# Patient Record
Sex: Male | Born: 1955 | Race: White | Hispanic: No
Health system: Midwestern US, Community
[De-identification: ages and names within clinical notes are randomized; demographics above are authoritative.]

## PROBLEM LIST (undated history)

## (undated) DIAGNOSIS — F209 Schizophrenia, unspecified: Secondary | ICD-10-CM

## (undated) DIAGNOSIS — C801 Malignant (primary) neoplasm, unspecified: Secondary | ICD-10-CM

## (undated) DIAGNOSIS — F1010 Alcohol abuse, uncomplicated: Secondary | ICD-10-CM

## (undated) DIAGNOSIS — B1920 Unspecified viral hepatitis C without hepatic coma: Secondary | ICD-10-CM

## (undated) DIAGNOSIS — R569 Unspecified convulsions: Secondary | ICD-10-CM

## (undated) DIAGNOSIS — E7220 Disorder of urea cycle metabolism, unspecified: Secondary | ICD-10-CM

## (undated) DIAGNOSIS — Z8505 Personal history of malignant neoplasm of liver: Secondary | ICD-10-CM

## (undated) DIAGNOSIS — R410 Disorientation, unspecified: Principal | ICD-10-CM

## (undated) HISTORY — DX: Disorder of urea cycle metabolism, unspecified: E72.20

## (undated) HISTORY — DX: Personal history of malignant neoplasm of liver: Z85.05

## (undated) MED ORDER — QUETIAPINE SR 300 MG 24 HR TAB
300 mg | ORAL_TABLET | Freq: Every evening | ORAL | Status: DC
Start: ? — End: 2013-06-29

## (undated) MED ORDER — ASPIRIN 81 MG CHEWABLE TAB
81 mg | ORAL_TABLET | Freq: Every day | ORAL | Status: AC
Start: ? — End: 2013-07-29

## (undated) MED ORDER — FLUOXETINE 20 MG CAP
20 mg | ORAL_CAPSULE | Freq: Every day | ORAL | Status: AC
Start: ? — End: 2013-07-29

## (undated) MED ORDER — FLUOXETINE 20 MG CAP
20 mg | ORAL_CAPSULE | Freq: Every day | ORAL | Status: DC
Start: ? — End: 2013-06-29

## (undated) MED ORDER — MULTIVITAMIN-IRON 9 MG-FOLIC ACID 400 MCG-CALCIUM & MINERALS TABLET
9 mg iron-400 mcg | ORAL_TABLET | Freq: Every day | ORAL | Status: AC
Start: ? — End: 2013-07-29

## (undated) MED ORDER — QUETIAPINE 100 MG TAB
100 mg | ORAL_TABLET | Freq: Every evening | ORAL | Status: AC
Start: ? — End: 2013-07-29

## (undated) MED ORDER — THIAMINE HCL 100 MG TAB
100 mg | ORAL_TABLET | Freq: Every day | ORAL | Status: AC
Start: ? — End: 2013-07-29

## (undated) MED ORDER — PANTOPRAZOLE 40 MG TAB, DELAYED RELEASE
40 mg | ORAL_TABLET | Freq: Every day | ORAL | Status: AC
Start: ? — End: 2013-07-29

## (undated) MED ORDER — FOLIC ACID 1 MG TAB
1 mg | ORAL_TABLET | Freq: Every day | ORAL | Status: AC
Start: ? — End: 2013-07-29

---

## 2009-12-04 ENCOUNTER — Inpatient Hospital Stay
Admit: 2009-12-04 | Discharge: 2009-12-10 | Disposition: A | Discharge status: Home / Self Care | Payer: MEDICAID | Attending: Psychiatry | Admitting: Psychiatry

## 2009-12-04 DIAGNOSIS — F201 Disorganized schizophrenia: Secondary | ICD-10-CM

## 2009-12-04 MED ADMIN — lorazepam (ATIVAN) injection 2 mg: INTRAMUSCULAR | NDC 00409677802

## 2009-12-04 MED ADMIN — ziprasidone (GEODON) 20 mg in sterile water (preservative free) injection: INTRAMUSCULAR | NDC 00409488710

## 2009-12-04 MED FILL — LORAZEPAM 2 MG/ML IJ SOLN: 2 mg/mL | INTRAMUSCULAR | Qty: 1

## 2009-12-04 MED FILL — GEODON 20 MG/ML (FINAL CONCENTRATION) INTRAMUSCULAR SOLUTION: 20 mg/mL (final conc.) | INTRAMUSCULAR | Qty: 20

## 2009-12-04 NOTE — ED Notes (Signed)
Pt given dinner tray and bag lunch to eat. Pt cooperative throughout medication administration and specimen collection.

## 2009-12-04 NOTE — ED Notes (Signed)
Pt reports seeing Dr. Fredda Hammed "many years ago". Pt stated "I have been outside for a month, because I lost my house. I mean I can't find it."

## 2009-12-04 NOTE — ED Notes (Signed)
[facility] counselor here to evaluate patient for TDO.

## 2009-12-04 NOTE — ED Notes (Signed)
Pt picked up by [employer] on street, mumbling and rocking, stated "People are after me, trying to kill me, I can see them standing right there." Pt indicated corner of room where no one is standing. Pt threw trash can out of the room, stated "I had to get them before they get me." Pt currently refusing to get into gowns, stated "I know I will need to do that, but I am not going to do that right now." Pt is chewing on corner of coat, sitting on edge of bed, rocking back and forth. Pt is disheveled, smells of cigarette smoke. Pt stated "I'm not going to lie to you, I don't know where I am, what year it is, or why I am in here." [facility] counselor present in ED.

## 2009-12-04 NOTE — ED Notes (Signed)
Stable repeat BP98/67, RR 89, saturating 98% RA, Temp 98, not orthostatic, ambulatory, No dizziness

## 2009-12-04 NOTE — ED Notes (Signed)
Pt is poor historian, unable to provide personal history.

## 2009-12-04 NOTE — ED Notes (Signed)
Awaiting bed assignment from NS.

## 2009-12-04 NOTE — ED Notes (Signed)
[facility] here with TDO.
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
## 2009-12-04 NOTE — Behavioral Health Treatment Team (Cosign Needed)
Pt did not attend creative expression group.

## 2009-12-04 NOTE — ED Provider Notes (Signed)
I have personally seen and evaluated patient. I find the patient's history and physical exam are consistent with the PA's NP documentation. I agree with the care provided, treatments rendered, disposition and follow up plan.

## 2009-12-04 NOTE — ED Provider Notes (Signed)
HPI Comments: Patient is an extremely poor historian. Evidently patient was picked up by EMS for unclear reasons. He has a hx of schizophrenia and not on his meds--does not know which meds he is on. He explains that he hears voices and sees people who are not there--all of which are telling him that they want to kill him. He does not know why he is here and does not know where he came from. He says he is suicidal. Denies thoughts or intentions of hurting others.     Patient is a 54 y.o. male presenting with mental health disorder. The history is provided by the patient. The history is limited by the condition of the patient. No language interpreter was used.   Mental Health Problem  The primary symptoms include hallucinations and bizarre behavior.   He admits to suicidal ideas. He does not contemplate injuring another person. Risk factors that are present for mental illness include a history of mental illness.        No past medical history on file.     No past surgical history on file.      No family history on file.     History   Social History   ??? Marital Status: Unknown     Spouse Name: N/A     Number of Children: N/A   ??? Years of Education: N/A   Occupational History   ??? Not on file.   Social History Main Topics   ??? Smoking status: Not on file   ??? Smokeless tobacco: Not on file   ??? Alcohol Use: Not on file   ??? Drug Use: Not on file   ??? Sexually Active: Not on file   Other Topics Concern   ??? Not on file   Social History Narrative   ??? No narrative on file           ALLERGIES: Review of patient's allergies indicates not on file.      Review of Systems   Unable to perform ROS  Psychiatric/Behavioral: Positive for suicidal ideas, hallucinations and behavioral problems. The patient is nervous/anxious.        There were no vitals filed for this visit.         Physical Exam   Nursing note and vitals reviewed.  Constitutional: He is oriented to person, place, and time. He appears well-developed and well-nourished.    HENT:   Head: Normocephalic and atraumatic.   Right Ear: External ear normal.   Left Ear: External ear normal.   Mouth/Throat: Oropharynx is clear and moist. No oropharyngeal exudate.   Eyes: Conjunctivae and extraocular motions are normal. Pupils are equal, round, and reactive to light. Right eye exhibits no discharge. Left eye exhibits no discharge. No scleral icterus.   Neck: Normal range of motion. Neck supple. No tracheal deviation present. No thyromegaly present.   Cardiovascular: Normal rate, regular rhythm, normal heart sounds and intact distal pulses.    No murmur heard.  Pulmonary/Chest: Effort normal and breath sounds normal. No respiratory distress. He has no wheezes. He has no rales.   Abdominal: Soft. He exhibits no distension. No tenderness. He has no rebound and no guarding.   Musculoskeletal: Normal range of motion. He exhibits no edema and no tenderness.   Lymphadenopathy:     He has no cervical adenopathy.   Neurological: He is alert and oriented to person, place, and time. No cranial nerve deficit. Coordination normal.   Skin: Skin is warm. No rash  noted. No erythema.   Psychiatric: He has a normal mood and affect. His behavior is normal. Judgment and thought content normal.        Coding    Procedures

## 2009-12-04 NOTE — ED Notes (Signed)
Pt aroused with verbal stimuli, pt sleeping with eye closed quietly.

## 2009-12-05 DIAGNOSIS — F201 Disorganized schizophrenia: Secondary | ICD-10-CM

## 2009-12-05 LAB — METABOLIC PANEL, COMPREHENSIVE
A-G Ratio: 0.9 — ABNORMAL LOW (ref 1.1–2.2)
ALT (SGPT): 275 U/L — ABNORMAL HIGH (ref 12–78)
AST (SGOT): 210 U/L — ABNORMAL HIGH (ref 15–37)
Albumin: 3 g/dL — ABNORMAL LOW (ref 3.5–5.0)
Alk. phosphatase: 83 U/L (ref 50–136)
Anion gap: 4 mmol/L — ABNORMAL LOW (ref 5–15)
BUN/Creatinine ratio: 8 — ABNORMAL LOW (ref 12–20)
BUN: 8 MG/DL (ref 6–20)
Bilirubin, total: 0.6 MG/DL (ref 0.2–1.0)
CO2: 27 MMOL/L (ref 21–32)
Calcium: 8.6 MG/DL (ref 8.5–10.1)
Chloride: 109 MMOL/L — ABNORMAL HIGH (ref 97–108)
Creatinine: 1 MG/DL (ref 0.6–1.3)
GFR est AA: >60 mL/min/{1.73_m2} (ref >60)
GFR est non-AA: >60 mL/min/{1.73_m2} (ref >60)
Globulin: 3.3 g/dL (ref 2.0–4.0)
Glucose: 95 MG/DL (ref 65–100)
Potassium: 4.2 MMOL/L (ref 3.5–5.1)
Protein, total: 6.3 g/dL — ABNORMAL LOW (ref 6.4–8.2)
Sodium: 140 MMOL/L (ref 136–145)

## 2009-12-05 LAB — POC CHEM8
Anion gap (POC): 16 — ABNORMAL HIGH (ref 5–15)
BUN (POC): 4 MG/DL — ABNORMAL LOW (ref 9–20)
CO2 (POC): 25 MMOL/L (ref 21–32)
Calcium, ionized (POC): 1.19 MMOL/L (ref 1.12–1.32)
Chloride (POC): 105 MMOL/L (ref 98–107)
Creatinine (POC): 0.5 MG/DL — ABNORMAL LOW (ref 0.6–1.3)
GFRAA, POC: >60 mL/min/{1.73_m2} (ref >60)
GFRNA, POC: >60 mL/min/{1.73_m2} (ref >60)
Glucose (POC): 96 MG/DL (ref 75–110)
Hematocrit (POC): 51 % — ABNORMAL HIGH (ref 36.6–50.3)
Hemoglobin (POC): 17.3 GM/DL — ABNORMAL HIGH (ref 12.1–17.0)
Potassium (POC): 3.9 MMOL/L (ref 3.5–5.1)
Sodium (POC): 141 MMOL/L (ref 137–145)

## 2009-12-05 LAB — ETHYL ALCOHOL: ALCOHOL(ETHYL),SERUM: 183 MG/DL — ABNORMAL HIGH (ref <10)

## 2009-12-05 LAB — CBC W/O DIFF
HCT: 43.4 % (ref 36.6–50.3)
HGB: 14.8 g/dL (ref 12.1–17.0)
MCH: 30.5 PG (ref 26.0–34.0)
MCHC: 34.1 g/dL (ref 30.0–36.5)
MCV: 89.5 FL (ref 80.0–99.0)
PLATELET: 143 10*3/uL — ABNORMAL LOW (ref 150–400)
RBC: 4.85 M/uL (ref 4.10–5.70)
RDW: 13.2 % (ref 11.5–14.5)
WBC: 5.2 10*3/uL (ref 4.1–11.1)

## 2009-12-05 LAB — TSH 3RD GENERATION: TSH: 1.27 u[IU]/mL (ref 0.36–3.74)

## 2009-12-05 MED ADMIN — sodium chloride 0.9 % bolus infusion 1,000 mL: INTRAVENOUS | @ 03:00:00 | NDC 00409798309

## 2009-12-05 MED ADMIN — olanzapine (ZYPREXA) tablet 10 mg: ORAL | @ 23:00:00 | NDC 00002411501

## 2009-12-05 MED ADMIN — lorazepam (ATIVAN) tablet 2 mg: ORAL | @ 23:00:00 | NDC 68084008911

## 2009-12-05 MED ADMIN — sodium chloride 0.9 % bolus infusion 1,000 mL: INTRAVENOUS | @ 02:00:00 | NDC 00409798309

## 2009-12-05 MED FILL — ZYPREXA 5 MG TABLET: 5 mg | ORAL | Qty: 2

## 2009-12-05 MED FILL — SODIUM CHLORIDE 0.9 % IV: INTRAVENOUS | Qty: 1000

## 2009-12-05 MED FILL — LORAZEPAM 1 MG TAB: 1 mg | ORAL | Qty: 2

## 2009-12-05 NOTE — Behavioral Health Treatment Team (Cosign Needed)
Pt didn't attend goals group.

## 2009-12-05 NOTE — Behavioral Health Treatment Team (Cosign Needed)
Patient did not attend process group

## 2009-12-05 NOTE — H&P (Signed)
Patient seen, chart reviewed, staffing held and dictated report done. Please see dictated report for complete details.

## 2009-12-05 NOTE — Behavioral Health Treatment Team (Addendum)
Client quit isolative guard in behavior  Interaction limited to needs denies looses thoughts or life mgt. problems.Clt. complaint w/union rules meals and medication no mgt.problem noted

## 2009-12-05 NOTE — Progress Notes (Signed)
TRANSFER - OUT REPORT:    Verbal report given to Thompson,RN on Eugene Ferguson  being transferred to Psych(unit) for routine progression of care       Report consisted of patient???s Situation, Background, Assessment and   Recommendations(SBAR).     Information from the following report(s) SBAR was reviewed with the receiving nurse.    Opportunity for questions and clarification was provided.

## 2009-12-05 NOTE — Behavioral Health Treatment Team (Signed)
Client did not attend Nursing Education Group.

## 2009-12-05 NOTE — Behavioral Health Treatment Team (Signed)
GROUP THERAPY PROGRESS NOTE    Eugene Ferguson is participating in Evening Community Meeting.     Group time: 30 minutes    Personal goal for participation: to orient patients to afternoon schedule    Goal orientation: community    Group therapy participation: active    Therapeutic interventions reviewed and discussed: yes    Impression of participation: good

## 2009-12-05 NOTE — Behavioral Health Treatment Team (Cosign Needed)
GROUP THERAPY PROGRESS NOTE    The patient Eugene Ferguson a 54 y.o. male is participating in Coping Skills Group.     Group time: 45 minutes    Personal goal for participation: To participate in chair exercise routine    Goal orientation:  personal    Group therapy participation: active    Therapeutic interventions reviewed and discussed: benefits of exercise    Impression of participation:  The patient was attentive.    BEVERLY S BAKER  12/05/2009  5:14 PM

## 2009-12-05 NOTE — Behavioral Health Treatment Team (Signed)
GROUP THERAPY PROGRESS NOTE    Eugene Ferguson is participating in relaxation.     Group time: 30 minutes    Personal goal for participation: to relax    Goal orientation: relaxation    Group therapy participation: minimal    Therapeutic interventions reviewed and discussed: yes    Impression of participation: Fair
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
## 2009-12-05 NOTE — Behavioral Health Treatment Team (Signed)
GROUP THERAPY PROGRESS NOTE    The patient Eugene Ferguson a 54 y.o. male is participating in Reflections Group    Group time: 30 minutes    Personal goal for participation: To discuss the daily goals    Goal orientation: personal    Group therapy participation: passive    Therapeutic interventions reviewed and discussed:  Yes    Impression of participation: fair    Darrel Hoover  12/05/2009  9:45 PM

## 2009-12-05 NOTE — Behavioral Health Treatment Team (Signed)
TRANSFER - IN REPORT:    Verbal report received from Brenton Grills RN on Eugene Ferguson  being received from [facility] for routine progression of care      Report consisted of patient???s Situation, Background, Assessment and   Recommendations(SBAR).     Information from the following report(s) ED Summary and Recent Results was reviewed with the receiving nurse.    Opportunity for questions and clarification was provided.      Assessment completed upon patient???s arrival to unit and care assumed.

## 2009-12-05 NOTE — Behavioral Health Treatment Team (Cosign Needed)
Pt absent from community group.

## 2009-12-05 NOTE — Behavioral Health Treatment Team (Signed)
Patient alert, isolating to room and self. Flat affect, labile affect. Patient denies SI/HI. Patient presents as guarded and suspicious. Patient responding to internal stimuli. Patient compliant with meal, not currently on scheduled medications. Patient to be evaluated by physician. Patient denies any discomfort or concerns at this time. 1:1 to assess patient thoughts. Patient encouraged to verbalize concerns or discomfort to staff. Patient encouraged to approach staff if experiencing any bizarre thoughts.

## 2009-12-05 NOTE — Consults (Signed)
Pt was evaluated on 12/05/2009     Full note dictated    Gabriel Carina MD MPH

## 2009-12-05 NOTE — Behavioral Health Treatment Team (Cosign Needed)
GROUP THERAPY PROGRESS NOTE    The patient Eugene Ferguson a 54 y.o. male is participating in Creative Expression Group.     Group time: 1 hour    Personal goal for participation: To concentrate on selected task    Goal orientation: social    Group therapy participation: active    Therapeutic interventions reviewed and discussed: Crafts, games, music    Impression of participation: The patient was attentive.  BEVERLY S BAKER  12/06/2009  9:57 AM

## 2009-12-06 MED ADMIN — pantoprazole (PROTONIX) tablet 40 mg: ORAL | @ 22:00:00 | NDC 00008084181

## 2009-12-06 MED ADMIN — quetiapine SR (SEROQUEL XR) tablet 300 mg: ORAL | @ 02:00:00 | NDC 00310028360

## 2009-12-06 MED FILL — SEROQUEL XR 300 MG TABLET,EXTENDED RELEASE: 300 mg | ORAL | Qty: 1

## 2009-12-06 MED FILL — PROTONIX 40 MG TABLET,DELAYED RELEASE: 40 mg | ORAL | Qty: 1

## 2009-12-06 NOTE — Behavioral Health Treatment Team (Cosign Needed)
Psychosocial Assessment  Patient is cooperative for assessment; there is evidence of memory impairment, distraction due to internal stimuli; patient self-reports chronic pain is distracting him from fully participating in this assessment; patient was TDO'd due to paranoia, auditory and visual hallucinations, other symptoms of long-standing paranoid schizophrenia.      Patient reporting some years of abstinence by recent history; however, his ETOH was 183 upon admission.  He self-reports diagnosis of cirrhosis, liver cancer, chronic pain, other ills.  Patient has been getting psychiatric medications from his internist reportedly, a Dr. Jean Rosenthal, on [address], [place].  He may have a pain doctor as well.  He has been non-compliant for an undetermined period of time; currently indicating intent to become compliant.      Today patient reports continuing auditory hallucinations, but lessening in frequency and intensity; denies SI/HI.  Patient reports no family to call on his behalf.  He mentions [facility] [place], [phone]) as his case manager (current?), but would accept a referral to [facility] if his case is closed at [facility].       [place] resides in [place] at "a drug house" although he denies any illicit drug use; says alcohol was always his drug of choice.   He is unable to provide any phone numbers to verify the address.  He is aware that a bus ticket will be provided upon discharge.  He spends his days "walking for miles" to stay away from the activity in his house and he attends AA occasionally but does not have a sponsor.      Patient was provided supportive psychotherapy and encouragement, reality orientation.

## 2009-12-06 NOTE — Behavioral Health Treatment Team (Cosign Needed)
GROUP THERAPY PROGRESS NOTE    Eugene Ferguson pt.didn't attend group     Group time: 30 minutes    Personal goal for participation: create a daily goal    Goal orientation: personal    Group therapy participation: no pparticipation    Therapeutic interventions reviewed and discussed: yes    Impression of participation:none

## 2009-12-06 NOTE — Behavioral Health Treatment Team (Cosign Needed)
Pt attend goals group.

## 2009-12-06 NOTE — Behavioral Health Treatment Team (Cosign Needed)
Pt absent from community group.

## 2009-12-06 NOTE — Behavioral Health Treatment Team (Cosign Needed)
Social Work Note  Made a call to [facility] and left a message for the supervisor of Ms. Eugene Ferguson who is out sick today

## 2009-12-06 NOTE — Behavioral Health Treatment Team (Cosign Needed)
Pt did not attend coping skills group.
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
## 2009-12-06 NOTE — Behavioral Health Treatment Team (Cosign Needed)
Patient did not attend process group

## 2009-12-06 NOTE — Behavioral Health Treatment Team (Cosign Needed)
Client quit low key flat depressed affect  w/drawn to room resting except for needs clt. complaint w/meds and meals no verbalize discomfort or  issues of concern provide .

## 2009-12-06 NOTE — Behavioral Health Treatment Team (Cosign Needed)
GROUP THERAPY PROGRESS NOTE    Noal Abshier is participating in [employer].     Group time: 30 minutes    Personal goal for participation: to orient patients to afternoon schedule    Goal orientation: community    Group therapy participation: active    Therapeutic interventions reviewed and discussed: yes    Impression of participation: fair

## 2009-12-06 NOTE — Behavioral Health Treatment Team (Cosign Needed)
Clt. didn't attended relaxation group

## 2009-12-06 NOTE — Behavioral Health Treatment Team (Signed)
Pt rested during the night with eyes closed. Will continue to monitor.

## 2009-12-06 NOTE — H&P (Signed)
Name: Eugene Ferguson, Eugene Ferguson Admitted: 12/04/2009  MR #: [id number] DOB: 09/08/1956  Account #: [id number] Age: 54  Physician: Gabriel Carina, M.D. Location: [id number]     HISTORY PHYSICAL    HISTORY OF PRESENT ILLNESS: The patient is a 54 year old gentleman. I saw  him in the psychiatric unit on the date of service. I was asked to evaluate  the patient for medical H and P.    Patient is known to have multiple psychiatric problems. During the scope of  this dictation, he stated that he suffers from cirrhosis and also  hallucinations, therefore, he was admitted to the psychiatric unit for  further evaluation.    PAST MEDICAL HISTORY  1. According to what I could gather from the patient, he suffers from  disorganized schizophrenia, chronic condition, with acute exacerbation.  2. Alcohol dependence.  3. Tobacco dependence.  4. Questionable history of cirrhosis, ________ more likely than not.  5. Possible atherosclerotic vascular disease from patient's history.  6. Gastroesophageal reflux disease.    SOCIAL HISTORY: Noncontributory.    FAMILY HISTORY: Patient's father died around 37 years of age from coronary  artery disease. Patient's mother is about 84 years of age, apparently  healthy.    PERSONAL HISTORY: Patient acknowledges to smoking 1 pack per day of  cigarettes and ongoing alcohol beyond quantitation at this time. He denies  any other illicit drug abuse. He is unemployed.    REVIEW OF SYSTEMS: Not reliable. The patient ruminates on various  symptoms. The only thing that came out significant on attempt to gather  data, he suffers from gastroesophageal reflux disease and his mental health  related problem. Patient at the time of my interview and physical  examination, urgently requested that I order him "heartburn medication."    PHYSICAL EXAMINATION  GENERAL: The patient is a somewhat disheveled, Caucasian gentleman in no  apparent distress.  VITAL SIGNS: Vitals noted.   HEENT: Head is atraumatic, normocephalic. Extraocular movements are  intact. Oral mucosa moist and pink. Halitosis noted. No JVD, carotid  bruits, or thyromegaly noted. Poor dental hygiene noted. No carotid bruits  noted.  LUNGS: Clear to auscultation. Prolonged expiration, but no sign of acute  exacerbation of COPD noted.  HEART: Heart sounds are normal. S1, S2. No S3 or S4 appreciated. There is  no murmur or rub appreciated. Pedal pulses are symmetrical.  ABDOMEN: Soft. Bowel sounds positive. Could not clinically elicit any sign  of ascites. No splenomegaly appreciated. No caput medusae noted.  RECTAL AND GENITOURINARY: Not done.  MUSCULOSKELETAL: Trace bilateral lower extremity edema. No cyanosis or  jaundice noted.  NEUROLOGIC: Patient's gait is appropriate for his chronic alcohol use. I  do not see any obvious sign of staggering or any sign of CVA. Cranial  nerves appear intact.    LABORATORY DATA: Reviewed.    ASSESSMENTS AND PLANS  1. Possible gastroesophageal reflux disease, not unlikely in this patient  with tobacco, alcohol, and possible cirrhosis.  2. History of cirrhosis.  3. Hallucinations and other comorbidities beyond the scope of this  dictation.    Given the clinical scenario, at this time I will only recommend addition of  Protonix 40 mg p.o. daily, continue to monitor vitals as you are doing.  Hospitalist service will remain available on a p.r.n. basis.              Gabriel Carina, M.D.    cc: Gabriel Carina, M.D.        HMY/wmx; D:  12/06/2009 2:02 P; T: 12/06/2009 1:05 P; DOC# [id number]; Job#  [id number]

## 2009-12-06 NOTE — Behavioral Health Treatment Team (Cosign Needed)
Pt did not attend creative expression group.

## 2009-12-07 MED ADMIN — quetiapine sr (SEROQUEL XR) TABLET 400 mg: ORAL | @ 02:00:00 | NDC 00310028260

## 2009-12-07 MED ADMIN — pantoprazole (PROTONIX) tablet 40 mg: ORAL | @ 11:00:00 | NDC 00008084181

## 2009-12-07 MED ADMIN — olanzapine (ZYPREXA) tablet 10 mg: ORAL | @ 14:00:00 | NDC 00002411501

## 2009-12-07 MED FILL — PROTONIX 40 MG TABLET,DELAYED RELEASE: 40 mg | ORAL | Qty: 1

## 2009-12-07 MED FILL — SEROQUEL XR 200 MG TABLET,EXTENDED RELEASE: 200 mg | ORAL | Qty: 2

## 2009-12-07 MED FILL — ZYPREXA 5 MG TABLET: 5 mg | ORAL | Qty: 2

## 2009-12-07 NOTE — Behavioral Health Treatment Team (Signed)
BHT 3pm to 11pm  PT meal and medication compliant. PT very isolative. PT spent majority of shift up in room. PT had no behavioral issues. PT denies any harm to self or others. PT will remain on Q checks for safety.

## 2009-12-07 NOTE — Behavioral Health Treatment Team (Cosign Needed)
GROUP THERAPY PROGRESS NOTE    The patient Eugene Ferguson a 54 y.o. male is participating in [employer].     Group time: 1 hour    Personal goal for participation: To concentrate on selected task    Goal orientation: social    Group therapy participation: active    Therapeutic interventions reviewed and discussed: Crafts, games, music    Impression of participation: The patient was attentive.    BEVERLY S BAKER  12/10/2009  9:49 AM

## 2009-12-07 NOTE — Behavioral Health Treatment Team (Signed)
GROUP THERAPY PROGRESS NOTE    Eugene Ferguson is participating in [employer].     Group time: 30 minutes    Personal goal for participation: to orient patients to afternoon schedule    Goal orientation: community    Group therapy participation: active    Therapeutic interventions reviewed and discussed: yes    Impression of participation: good

## 2009-12-07 NOTE — Behavioral Health Treatment Team (Cosign Needed)
Pt absent from community group.

## 2009-12-07 NOTE — Behavioral Health Treatment Team (Signed)
Patient did not attend Nursing Education Group.

## 2009-12-07 NOTE — Behavioral Health Treatment Team (Cosign Needed)
Patient did not attend Goals Group.  PAULETTE J ALLEN  12/07/2009  2:28 PM
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
## 2009-12-07 NOTE — Behavioral Health Treatment Team (Signed)
Pt has spent most of shift in room. Pt has had minimal interaction with staff or peers. Compliant with meals and medication. Denies SI/HI. Pt has been no behavioral problem. Encourage to attend groups and interact with peers and staff. Will continue to monitor for safety.

## 2009-12-07 NOTE — Behavioral Health Treatment Team (Signed)
GROUP THERAPY PROGRESS NOTE    Eugene Ferguson is participating in reflections.     Group time: 30 minutes    Personal goal for participation: to reflect on daily goal    Goal orientation: personal    Group therapy participation: minimal    Therapeutic interventions reviewed and discussed: yes    Impression of participation: Morene Antu

## 2009-12-07 NOTE — Behavioral Health Treatment Team (Signed)
Remained in bed quiet all night, monitored q 15 minutes for safety

## 2009-12-07 NOTE — Behavioral Health Treatment Team (Signed)
Pt did not attend relaxation group.

## 2009-12-08 MED ADMIN — ibuprofen (MOTRIN) tablet 800 mg: ORAL | @ 23:00:00 | NDC 55111068205

## 2009-12-08 MED ADMIN — ibuprofen (MOTRIN) tablet 800 mg: ORAL | @ 17:00:00 | NDC 55111068205

## 2009-12-08 MED ADMIN — quetiapine SR (SEROQUEL XR) tablet 600 mg: ORAL | @ 02:00:00 | NDC 00310028360

## 2009-12-08 MED ADMIN — pantoprazole (PROTONIX) tablet 40 mg: ORAL | @ 12:00:00 | NDC 00008084181

## 2009-12-08 MED FILL — IBUPROFEN 400 MG TAB: 400 mg | ORAL | Qty: 2

## 2009-12-08 MED FILL — SEROQUEL XR 300 MG TABLET,EXTENDED RELEASE: 300 mg | ORAL | Qty: 2

## 2009-12-08 MED FILL — PROTONIX 40 MG TABLET,DELAYED RELEASE: 40 mg | ORAL | Qty: 1
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
## 2009-12-08 NOTE — Behavioral Health Treatment Team (Signed)
Chart audit completed

## 2009-12-08 NOTE — Behavioral Health Treatment Team (Signed)
Pt did not attend Med teaching has no scheduled med's at this time.

## 2009-12-08 NOTE — Behavioral Health Treatment Team (Signed)
Pt rested quietly in bed with eyes closed.  No signs/symptoms of distress, agitation, or anxiety.  Will monitor for changes.  Q 15 minute checks continue.  2 hour chart audit done.

## 2009-12-08 NOTE — Behavioral Health Treatment Team (Signed)
Pt has been meal complaint but has no scheduled meds at this time requested something for pain. Visible on unit for meals.Minimal interact with staff and peers. Affect flat but minimally verbalizing regarding drinking issues and liver disease.Encouraged to attend unit activities and interact with staff and peers. Will continue to monitor pt and behavior.

## 2009-12-08 NOTE — Behavioral Health Treatment Team (Signed)
Pt has been isolative and withdrawn to room except for meals and med's. Taking ibuprofen for discomfort but states it does not relieve discomfort will discuss with physician tomorrow.Denies SI,HI,A/V Hallucinations.States he came into the hospital because he ran out of medication.Encourage to talk with social worker Monday.Will continue to monitor pt and behavior.

## 2009-12-08 NOTE — Behavioral Health Treatment Team (Signed)
Pt did not attend Coping Skills Group.

## 2009-12-08 NOTE — Behavioral Health Treatment Team (Signed)
GROUP THERAPY PROGRESS NOTE    Eugene Ferguson is participating in [place].     Group time: 30 minutes    Personal goal for participation: daily/program orientation    Goal orientation: community    Group therapy participation: absent-pt completing ADL's    Therapeutic interventions reviewed and discussed: yes    Impression of participation: good

## 2009-12-08 NOTE — Behavioral Health Treatment Team (Cosign Needed)
GROUP THERAPY PROGRESS NOTE    Eugene Ferguson is participating in [place].     Group time: 30 minutes    Personal goal for participation: to reflect on daily goal    Goal orientation: personal    Group therapy participation: active    Therapeutic interventions reviewed and discussed: yes    Impression of participation: Good

## 2009-12-09 MED ADMIN — quetiapine SR (SEROQUEL XR) tablet 600 mg: ORAL | @ 02:00:00 | NDC 00310028360

## 2009-12-09 MED ADMIN — ibuprofen (MOTRIN) tablet 800 mg: ORAL | @ 22:00:00 | NDC 55111068205

## 2009-12-09 MED ADMIN — pantoprazole (PROTONIX) tablet 40 mg: ORAL | @ 12:00:00 | NDC 00008084181

## 2009-12-09 MED FILL — IBUPROFEN 400 MG TAB: 400 mg | ORAL | Qty: 2

## 2009-12-09 MED FILL — SEROQUEL XR 300 MG TABLET,EXTENDED RELEASE: 300 mg | ORAL | Qty: 2

## 2009-12-09 MED FILL — PROTONIX 40 MG TABLET,DELAYED RELEASE: 40 mg | ORAL | Qty: 1

## 2009-12-09 NOTE — Behavioral Health Treatment Team (Signed)
Chart audits completed q2 hours this shift by this nurse.
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
## 2009-12-09 NOTE — Behavioral Health Treatment Team (Signed)
GROUP THERAPY PROGRESS NOTE    Eugene Ferguson is participating in medication.     Group time: 15 minutes    Personal goal for participation: compliance    Goal orientation: medication education    Group therapy participation: active    Therapeutic interventions reviewed and discussed:     Impression of participation: good

## 2009-12-09 NOTE — Behavioral Health Treatment Team (Signed)
[name] who was absent from [place] group.    JAMES E CROSS  12/09/2009  10:07 AM

## 2009-12-09 NOTE — Behavioral Health Treatment Team (Signed)
Patient did not participate in group with prompting

## 2009-12-09 NOTE — Behavioral Health Treatment Team (Signed)
Pt visible on unit.  A/O x 3. Some anxiety noted. .Denies a/v hallucinations or suicidal ideation. Friendly and cooperative.  Interactive with select peers.No behavior issues presented this shift. Compliant to oral medications

## 2009-12-09 NOTE — Behavioral Health Treatment Team (Signed)
GROUP THERAPY PROGRESS NOTE    Eugene Ferguson is participating in [place].     Group time: 30 minutes    Personal goal for participation: daily/program orientation    Goal orientation: community    Group therapy participation: absent-pt completing ADL's    Therapeutic interventions reviewed and discussed: yes    Impression of participation: n/a

## 2009-12-09 NOTE — Behavioral Health Treatment Team (Signed)
Pt did not attend Med Teaching.

## 2009-12-09 NOTE — Behavioral Health Treatment Team (Signed)
Pt rested quietly in bed with eyes closed.  No signs/symptoms of distress, agitation, or anxiety.  Will monitor for changes.  Q 15 minute checks continue.

## 2009-12-10 MED ORDER — QUETIAPINE SR 300 MG 24 HR TAB
300 mg | ORAL_TABLET | Freq: Every evening | ORAL | Status: DC
Start: 2009-12-10 — End: 2011-12-24

## 2009-12-10 MED ORDER — PANTOPRAZOLE 40 MG TAB, DELAYED RELEASE
40 mg | ORAL_TABLET | Freq: Every day | ORAL | Status: DC
Start: 2009-12-10 — End: 2010-12-02

## 2009-12-10 MED ADMIN — pantoprazole (PROTONIX) tablet 40 mg: ORAL | @ 14:00:00 | NDC 00008084181

## 2009-12-10 MED ADMIN — quetiapine SR (SEROQUEL XR) tablet 600 mg: ORAL | @ 03:00:00 | NDC 00310028360

## 2009-12-10 MED ADMIN — ibuprofen (MOTRIN) tablet 800 mg: ORAL | @ 14:00:00 | NDC 55111068205

## 2009-12-10 MED FILL — SEROQUEL XR 300 MG TABLET,EXTENDED RELEASE: 300 mg | ORAL | Qty: 2

## 2009-12-10 MED FILL — IBUPROFEN 400 MG TAB: 400 mg | ORAL | Qty: 2

## 2009-12-10 MED FILL — PROTONIX 40 MG TABLET,DELAYED RELEASE: 40 mg | ORAL | Qty: 1

## 2009-12-10 NOTE — Progress Notes (Signed)
PATIENT DISCHARGE COUNSELING    The patient Eugene Ferguson is a 54 y.o. male who has a past medical history of Other ill-defined conditions; Liver disease; and Tobacco dependence (12/05/2009).  He also has no past medical history of CAD (coronary artery disease); Heart failure; Hypertension; Stroke; Thromboembolus; COPD; Diabetes; Endocrine disease; PUD (peptic ulcer disease); Gastrointestinal disorder; Chronic kidney disease; Cancer; Sleep disorder; Arthritis; Autoimmune disease; Seizures; Neurological disorder; Psychiatric disorder; DEMENTIA; Infectious disease; Anemia NEC; Asthma; Community acquired pneumonia; Respiratory abnormalities; Heart abnormalities; Musculoskeletal disorder; Hearing reduced; Otitis media; Vision decreased; Developmental delay; PREMATURE BIRTH; Fetal alcohol syndrome; Tick bite; STD (sexually transmitted disease); Cesarean delivery; Delivery normal; or OTHER MEDICAL.Marland Kitchen  He was admitted on 12/04/2009 with an initial diagnosis of schizophrenia, ETOH abuse, Psychosis  DISORGANIZED SCHIZOPHRENIA, CHRONIC CONDITION WITH ACUTE EXACERBATION.     The patient???s medications were reviewed and discussed with Rhea Pink, PHARMD prior to discharge. The patient was able to communicate and he did not understand their medications.    The following medications below were discussed briefly with the patient. The [facility].    Current Discharge Medication List      START taking these medications       pantoprazole (PROTONIX) 40 mg tablet    Take 1 Tab by mouth daily (before breakfast).    Qty: 15 Tab Refills: 1        quetiapine sr (SEROQUEL XR) 300 mg sr tablet    Take 2 Tabs by mouth nightly.    Qty: 30 Tab Refills: 1          CONTINUE these medications which have NOT CHANGED       esomeprazole (NEXIUM) 40 mg capsule    Take  by mouth two (2) times a day. Verified through [facility] pharmacy   Indications: GASTROESOPHAGEAL REFLUX         albuterol (PROVENTIL, VENTOLIN) 90 mcg/Actuation inhaler    Take 2 Puffs by inhalation every four (4) hours as needed. Verified through [facility] pharmacy           STOP taking these medications       quetiapine (SEROQUEL) 300 mg tablet    Comments:     Reason for Stopping:         hydrocodone-acetaminophen (LORTAB 7.5) 7.5-500 mg per tablet    Comments:     Reason for Stopping:                 Signed:Robin Audrie Gallus, [facility]   12/10/2009

## 2009-12-10 NOTE — Behavioral Health Treatment Team (Cosign Needed)
GROUP THERAPY PROGRESS NOTE    Eugene Ferguson is participating in [place].     Group time: 30 minutes    Personal goal for participation: daily/program orientation    Goal orientation: community    Group therapy participation: active    Therapeutic interventions reviewed and discussed: yes    Impression of participation: good

## 2009-12-10 NOTE — Behavioral Health Treatment Team (Signed)
Pt is sleeping in bed with even respirations. Will continue to monitor.

## 2009-12-10 NOTE — Behavioral Health Treatment Team (Cosign Needed)
Pt attend goals group.

## 2009-12-10 NOTE — Behavioral Health Treatment Team (Cosign Needed)
Social Work  Discharge noted for today with return to private residence.  The patient is aware of the plan and states readiness.  He denies current s/h's a/v's.  Thought process is clear and goal directed.    Eugene Ferguson states intent to return to his private residence.  Eugene Ferguson states intent to comply with out patient follow up and medication.    Bus tickets will be provided as the patient states that he is unable to arrange transportation.    Follow Up  [facility]  [address]  [phone]  12/11/09  10am

## 2009-12-10 NOTE — Behavioral Health Treatment Team (Signed)
Patient alert and visible on unit. Appropriate affect and mood. Patient denies ant SI/HI. Patient denies any bizarre behaviors or thoughts. Patient interacting with peers and staff appropriately. Patient for discharge today. Patient reports being ready for discharge. No concerns verbalized at this time. Will help facilitate discharge.
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
## 2010-04-27 LAB — LIPID PANEL
CHOL/HDL Ratio: 6.4 — ABNORMAL HIGH (ref 0–5.0)
Cholesterol, total: 205 MG/DL — ABNORMAL HIGH (ref <200)
HDL Cholesterol: 32 MG/DL
LDL, calculated: 142.2 MG/DL — ABNORMAL HIGH (ref 0–100)
Triglyceride: 154 MG/DL — ABNORMAL HIGH (ref <150)
VLDL, calculated: 30.8 MG/DL

## 2010-04-27 LAB — PSA SCREENING (SCREENING): Prostate Specific Ag: 0.7 ng/mL (ref 0.0–4.0)

## 2010-06-20 LAB — METABOLIC PANEL, COMPREHENSIVE
A-G Ratio: 0.9 — ABNORMAL LOW (ref 1.1–2.2)
ALT (SGPT): 74 U/L (ref 12–78)
AST (SGOT): 51 U/L — ABNORMAL HIGH (ref 15–37)
Albumin: 3.9 g/dL (ref 3.5–5.0)
Alk. phosphatase: 82 U/L (ref 50–136)
Anion gap: 7 mmol/L (ref 5–15)
BUN/Creatinine ratio: 15 (ref 12–20)
BUN: 12 MG/DL (ref 6–20)
Bilirubin, total: 1.7 MG/DL — ABNORMAL HIGH (ref 0.2–1.0)
CO2: 29 MMOL/L (ref 21–32)
Calcium: 9.2 MG/DL (ref 8.5–10.1)
Chloride: 99 MMOL/L (ref 97–108)
Creatinine: 0.8 MG/DL (ref 0.6–1.3)
GFR est AA: >60 mL/min/{1.73_m2} (ref >60)
GFR est non-AA: >60 mL/min/{1.73_m2} (ref >60)
Globulin: 4.3 g/dL — ABNORMAL HIGH (ref 2.0–4.0)
Glucose: 85 MG/DL (ref 65–100)
Potassium: 4 MMOL/L (ref 3.5–5.1)
Protein, total: 8.2 g/dL (ref 6.4–8.2)
Sodium: 135 MMOL/L — ABNORMAL LOW (ref 136–145)

## 2010-06-20 LAB — CBC WITH AUTOMATED DIFF
ABS. BASOPHILS: 0 10*3/uL (ref 0.0–0.1)
ABS. EOSINOPHILS: 0.1 10*3/uL (ref 0.0–0.4)
ABS. LYMPHOCYTES: 1.9 10*3/uL (ref 0.8–3.5)
ABS. MONOCYTES: 0.9 10*3/uL (ref 0.0–1.0)
ABS. NEUTROPHILS: 4.8 10*3/uL (ref 1.8–8.0)
BASOPHILS: 0 % (ref 0–1)
EOSINOPHILS: 1 % (ref 0–7)
HCT: 47 % (ref 36.6–50.3)
HGB: 16.4 g/dL (ref 12.1–17.0)
LYMPHOCYTES: 24 % (ref 12–49)
MCH: 30.2 PG (ref 26.0–34.0)
MCHC: 34.9 g/dL (ref 30.0–36.5)
MCV: 86.6 FL (ref 80.0–99.0)
MONOCYTES: 11 % (ref 5–13)
NEUTROPHILS: 64 % (ref 32–75)
PLATELET: 141 10*3/uL — ABNORMAL LOW (ref 150–400)
RBC: 5.43 M/uL (ref 4.10–5.70)
RDW: 13.4 % (ref 11.5–14.5)
WBC: 7.6 10*3/uL (ref 4.1–11.1)

## 2010-06-20 MED ADMIN — tetanus-diphtheria toxids-td 5-2 Lf unit/0.5 mL injection 0.5 mL: INTRAMUSCULAR | @ 21:00:00 | NDC 49281029110

## 2010-06-20 MED ADMIN — HYDROmorphone (PF) (DILAUDID) injection 2 mg: INTRAVENOUS | @ 21:00:00 | NDC 00409128331

## 2010-06-20 MED ADMIN — cefazolin (ANCEF) 1 g in 0.9% sodium chloride (MBP/ADV) 50 mL MBP: INTRAVENOUS | @ 21:00:00 | NDC 00781345170

## 2010-06-20 MED ADMIN — sodium chloride (NS) 0.9 % flush: @ 21:00:00 | NDC 82903065462

## 2010-06-20 MED ADMIN — ketorolac (TORADOL) injection 60 mg: INTRAMUSCULAR | @ 20:00:00 | NDC 00409379501

## 2010-06-20 NOTE — ED Notes (Signed)
Spoke to [facility], states cspine cleared by his assessment

## 2010-06-20 NOTE — ED Notes (Signed)
Pt transported via EMS on cardiac monitor. No distress noted. Pt reports pain 6/10 prior to transport.

## 2010-06-20 NOTE — ED Notes (Signed)
Pt presents to the ER with c/o right sided jaw pain after falling down a ladder. Pt reports the ladder sliding off the roof and he was holding to the steps. Incident occurred at 0700. Swelling noted of right jaw. 1 cm laceration noted on right buccal region. Bleeding controlled. Denies neck or back pain.

## 2010-06-20 NOTE — ED Notes (Signed)
M9C at [facility] health system contacted and informed that pt is in route to hospital.
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
## 2010-06-20 NOTE — ED Notes (Signed)
TRANSFER - OUT REPORT:    Verbal report given to [facility] on Eugene Ferguson  being transferred to [facility] Room 236 B for urgent transfer       Report consisted of patient???s Situation, Background, Assessment and   Recommendations(SBAR).     Information from the following report(s) ED Summary, MAR and Recent Results was reviewed with the receiving nurse.    Opportunity for questions and clarification was provided.

## 2010-12-02 LAB — METABOLIC PANEL, COMPREHENSIVE
A-G Ratio: 1 — ABNORMAL LOW (ref 1.1–2.2)
ALT (SGPT): 64 U/L (ref 12–78)
AST (SGOT): 49 U/L — ABNORMAL HIGH (ref 15–37)
Albumin: 3.9 g/dL (ref 3.5–5.0)
Alk. phosphatase: 74 U/L (ref 50–136)
Anion gap: 9 mmol/L (ref 5–15)
BUN/Creatinine ratio: 20 (ref 12–20)
BUN: 16 MG/DL (ref 6–20)
Bilirubin, total: 0.5 MG/DL (ref 0.2–1.0)
CO2: 27 MMOL/L (ref 21–32)
Calcium: 8.8 MG/DL (ref 8.5–10.1)
Chloride: 104 MMOL/L (ref 97–108)
Creatinine: 0.8 MG/DL (ref 0.6–1.3)
GFR est AA: >60 mL/min/{1.73_m2} (ref >60)
GFR est non-AA: >60 mL/min/{1.73_m2} (ref >60)
Globulin: 3.9 g/dL (ref 2.0–4.0)
Glucose: 83 MG/DL (ref 65–100)
Potassium: 4.2 MMOL/L (ref 3.5–5.1)
Protein, total: 7.8 g/dL (ref 6.4–8.2)
Sodium: 140 MMOL/L (ref 136–145)

## 2010-12-02 LAB — CBC WITH AUTOMATED DIFF
ABS. BASOPHILS: 0 10*3/uL (ref 0.0–0.1)
ABS. EOSINOPHILS: 0.2 10*3/uL (ref 0.0–0.4)
ABS. LYMPHOCYTES: 2.4 10*3/uL (ref 0.8–3.5)
ABS. MONOCYTES: 0.7 10*3/uL (ref 0.0–1.0)
ABS. NEUTROPHILS: 2.4 10*3/uL (ref 1.8–8.0)
BASOPHILS: 0 % (ref 0–1)
EOSINOPHILS: 4 % (ref 0–7)
HCT: 43.9 % (ref 36.6–50.3)
HGB: 14.9 g/dL (ref 12.1–17.0)
LYMPHOCYTES: 42 % (ref 12–49)
MCH: 29.4 PG (ref 26.0–34.0)
MCHC: 33.9 g/dL (ref 30.0–36.5)
MCV: 86.6 FL (ref 80.0–99.0)
MONOCYTES: 12 % (ref 5–13)
NEUTROPHILS: 42 % (ref 32–75)
PLATELET: 207 10*3/uL (ref 150–400)
RBC: 5.07 M/uL (ref 4.10–5.70)
RDW: 13.4 % (ref 11.5–14.5)
WBC: 5.7 10*3/uL (ref 4.1–11.1)

## 2010-12-02 NOTE — Other (Signed)
PATIENT GIVEN SURGICAL SITE INFECTION FAQS HANDOUT, DISCUSSED IMPORTANCE OF GOOD HAND HYGIENE, PATIENT VERBALIZED UNDERSTANDING.

## 2010-12-03 LAB — EKG, 12 LEAD, INITIAL
Atrial Rate: 64 {beats}/min
Calculated P Axis: 23 degrees
Calculated R Axis: -12 degrees
Calculated T Axis: -7 degrees
Diagnosis: NORMAL
P-R Interval: 176 ms
Q-T Interval: 396 ms
QRS Duration: 82 ms
QTC Calculation (Bezet): 408 ms
Ventricular Rate: 64 {beats}/min

## 2010-12-04 MED ADMIN — fentaNYL citrate (pf) injection 50 mcg: INTRAVENOUS | @ 16:00:00 | NDC 10019003839

## 2010-12-04 MED ADMIN — midazolam (VERSED) injection 1 mg: INTRAVENOUS | @ 16:00:00 | NDC 00409230502

## 2010-12-04 MED FILL — FENTANYL CITRATE (PF) 50 MCG/ML IJ SOLN: 50 mcg/mL | INTRAMUSCULAR | Qty: 2

## 2010-12-04 MED FILL — FENTANYL CITRATE (PF) 50 MCG/ML IJ SOLN: 50 mcg/mL | INTRAMUSCULAR | Qty: 4

## 2010-12-04 MED FILL — MIDAZOLAM 1 MG/ML IJ SOLN: 1 mg/mL | INTRAMUSCULAR | Qty: 5

## 2010-12-04 MED FILL — GLYCOPYRROLATE 0.2 MG/ML IJ SOLN: 0.2 mg/mL | INTRAMUSCULAR | Qty: 1

## 2010-12-04 MED FILL — CEFAZOLIN 2 GRAM/50 ML NS IVPB: INTRAVENOUS | Qty: 50

## 2010-12-04 MED FILL — MEPERIDINE (PF) 50 MG/ML IJ SOLN: 50 mg/mL | INTRAMUSCULAR | Qty: 1

## 2010-12-04 MED FILL — ONDANSETRON (PF) 4 MG/2 ML INJECTION: 4 mg/2 mL | INTRAMUSCULAR | Qty: 2

## 2010-12-04 MED FILL — NAROPIN 5 MG/ML INJECTION SOLUTION: 5 mg/mL | INTRAMUSCULAR | Qty: 30

## 2010-12-04 NOTE — Op Note (Signed)
Op Notes signed by Cathlean Cower, MD at 12/05/10 [phone]                 Author: Cathlean Cower, MD  Service: --  Author Type: Physician       Filed: 12/05/10 0809  Date of Service: 12/04/10 1538  Status: Signed          Editor: Cathlean Cower, MD (Physician)          <!--EPICS--> Name:      Eugene Ferguson, Eugene Ferguson MR #:      [id number]                    Surgeon:        Cathlean Cower, MD<BR> Account #: [id number]                  Surgery Date:   12/04/2010<BR> DOB:       1955/11/20<BR> Age:       55                           Location:       [facility]<BR> <BR>                              OPERATIVE REPORT<BR> <BR> <BR> PREOPERATIVE DIAGNOSIS: Left lateral malleolus  fracture.<BR> <BR> POSTOPERATIVE DIAGNOSIS: Left lateral malleolus fracture.<BR> <BR> OPERATIVE PROCEDURE:  Open reduction internal fixation, left lateral<BR> malleolus fracture.<BR> <BR> SURGEON:  Cathlean Cower, MD<BR> <BR> ANESTHESIA:  General.<BR>  <BR> TOTAL TOURNIQUET TIME: 43 minutes left lower extremity.<BR> <BR> COMPLICATIONS:  None.<BR> <BR> IMPLANTS USED: Synthes one-third tubular locking plate and screws.<BR> <BR> DISPOSITION: Patient was taken to the recovery room in stable condition.<BR>  <BR> INDICATIONS: The patient is a 55 year old male, who about 2 weeks ago<BR> fractured his left ankle. He presented to our office and saw both Dr.<BR> Patsey Berthold and myself and was found to have a displaced left lateral<BR> malleolus fracture with  what appeared to be a widened ankle mortise. We<BR> discussed operative fixation for his injury, and he wished to go forward.<BR> Informed consent was obtained from the patient regarding the risks and<BR> benefits, and he wished to go forward.<BR> <BR>  DESCRIPTION OF PROCEDURE:<BR> The patient was identified in the preoperative holding area. The left lower<BR> extremity was marked by the patient. All preoperative questions were<BR> answered. He was seen by the anesthesia department. A  popliteal block  was<BR> administered to the left leg. The left leg was marked with the patient, and<BR> all questions were answered. He was taken to the operating room and<BR> transferred to the operating room table in the supine position. Once<BR> appropriate anesthesia  was obtained, a tourniquet was placed around the<BR> left thigh. The left leg was prepped and draped in the usual sterile<BR> fashion. A time out was conducted indicating correct operative side and<BR> preoperative antibiotics given prior to incision  being made. Next, the leg<BR> was elevated, and the tourniquet was inflated.<BR> <BR> A standard lateral incision was made centered at the fracture of the<BR> lateral malleolus. This was dissected down to the bone. He had a somewhat<BR> oblique fracture  but not enough of one to allow for a lag screw, but the<BR> fibula itself was displaced 100% with the proximal portion being anterior,<BR> and the distal portion being posterior. I curetted out the area, cleaned<BR> out any soft tissue hematoma, irrigated  the entire area, and then<BR> anatomically reduced the fracture with lobster claw clamps. Once this was<BR> done, there was no room for a lag screw, so a one-third tubular locking<BR> plate was placed laterally over the fibula, and a nonlocking screw  was<BR> placed first, compressing the plate to the bone, and then a locking screw<BR> placed proximal to the fracture. Images were taken in the AP and lateral<BR> planes, showing anatomic alignment of the fracture. Several more locking<BR> screws were  filled in, and after this was done, the ankle was stressed with<BR> a Cotton test and external rotation test, and there was no increase in the<BR> medial clear space. The ankle mortise was well closed down, and there was<BR> no need for a syndesmotic  screw. Once this was done, there were a total of<BR> 3 screws distal and 3 screws proximal to the fracture. Final images were<BR> taken.<BR> <BR> The  incision was irrigated with saline, closed with a Vicryl suture for<BR> deep layer, Monocryl suture for  subcutaneous layer, and nylon sutures in<BR> the skin. Sterile dressings were placed. A bulky joint splint was placed.<BR> <BR> The patient was awakened and taken to the recovery room in stable<BR> condition.<BR> <BR> ESTIMATED BLOOD LOSS:  Minimal.<BR>  <BR> SPECIMEN:<BR> <BR> <BR> <BR> <BR> <BR> <BR> <BR> <BR> Cathlean Cower, MD<BR> <BR> cc:   Cathlean Cower, MD<BR> <BR> <BR> MHB/wmx; D: 12/04/2010 12:18 P; T: 12/04/2010  3:38 P; Doc# [id number]; Job#<BR> [id number]<BR> <!--EPICE-->

## 2010-12-04 NOTE — Op Note (Signed)
Name: Eugene Ferguson, Eugene Ferguson  MR #: [id number] Surgeon: Cathlean Cower, MD  Account #: [id number] Surgery Date: 12/04/2010  DOB: [birth year]  Age: 55 Location: [facility]     OPERATIVE REPORT      PREOPERATIVE DIAGNOSIS: Left lateral malleolus fracture.    POSTOPERATIVE DIAGNOSIS: Left lateral malleolus fracture.    OPERATIVE PROCEDURE: Open reduction internal fixation, left lateral  malleolus fracture.    SURGEON: Cathlean Cower, MD    ANESTHESIA: General.    TOTAL TOURNIQUET TIME: 43 minutes left lower extremity.    COMPLICATIONS: None.    IMPLANTS USED: Synthes one-third tubular locking plate and screws.    DISPOSITION: Patient was taken to the recovery room in stable condition.    INDICATIONS: The patient is a 55 year old male, who about 2 weeks ago  fractured his left ankle. He presented to our office and saw both Dr.  Patsey Berthold and myself and was found to have a displaced left lateral  malleolus fracture with what appeared to be a widened ankle mortise. We  discussed operative fixation for his injury, and he wished to go forward.  Informed consent was obtained from the patient regarding the risks and  benefits, and he wished to go forward.    DESCRIPTION OF PROCEDURE:  The patient was identified in the preoperative holding area. The left lower  extremity was marked by the patient. All preoperative questions were  answered. He was seen by the anesthesia department. A popliteal block was  administered to the left leg. The left leg was marked with the patient, and  all questions were answered. He was taken to the operating room and  transferred to the operating room table in the supine position. Once  appropriate anesthesia was obtained, a tourniquet was placed around the  left thigh. The left leg was prepped and draped in the usual sterile  fashion. A time out was conducted indicating correct operative side and  preoperative antibiotics given prior to incision being made. Next, the leg   was elevated, and the tourniquet was inflated.    A standard lateral incision was made centered at the fracture of the  lateral malleolus. This was dissected down to the bone. He had a somewhat  oblique fracture but not enough of one to allow for a lag screw, but the  fibula itself was displaced 100% with the proximal portion being anterior,  and the distal portion being posterior. I curetted out the area, cleaned  out any soft tissue hematoma, irrigated the entire area, and then  anatomically reduced the fracture with lobster claw clamps. Once this was  done, there was no room for a lag screw, so a one-third tubular locking  plate was placed laterally over the fibula, and a nonlocking screw was  placed first, compressing the plate to the bone, and then a locking screw  placed proximal to the fracture. Images were taken in the AP and lateral  planes, showing anatomic alignment of the fracture. Several more locking  screws were filled in, and after this was done, the ankle was stressed with  a Cotton test and external rotation test, and there was no increase in the  medial clear space. The ankle mortise was well closed down, and there was  no need for a syndesmotic screw. Once this was done, there were a total of  3 screws distal and 3 screws proximal to the fracture. Final images were  taken.    The incision was irrigated with saline, closed  with a Vicryl suture for  deep layer, Monocryl suture for subcutaneous layer, and nylon sutures in  the skin. Sterile dressings were placed. A bulky joint splint was placed.    The patient was awakened and taken to the recovery room in stable  condition.    ESTIMATED BLOOD LOSS: Minimal.    SPECIMEN:                  Cathlean Cower, MD    cc: Cathlean Cower, MD      MHB/wmx; D: 12/04/2010 12:18 P; T: 12/04/2010 3:38 P; Doc# [id number]; Job#  [id number]

## 2010-12-05 MED FILL — DIPRIVAN 10 MG/ML INTRAVENOUS EMULSION: 10 mg/mL | INTRAVENOUS | Qty: 20

## 2011-12-24 MED ORDER — HYDROCODONE-ACETAMINOPHEN 5 MG-325 MG TAB
5-325 mg | ORAL_TABLET | Freq: Three times a day (TID) | ORAL | Status: DC | PRN
Start: 2011-12-24 — End: 2012-11-12

## 2011-12-24 MED ORDER — QUETIAPINE SR 300 MG 24 HR TAB
300 mg | ORAL_TABLET | Freq: Every evening | ORAL | Status: DC
Start: 2011-12-24 — End: 2013-06-29

## 2011-12-24 MED ORDER — METHOCARBAMOL 750 MG TAB
750 mg | ORAL_TABLET | Freq: Three times a day (TID) | ORAL | Status: DC | PRN
Start: 2011-12-24 — End: 2012-11-12

## 2011-12-24 MED ORDER — FLUOXETINE 40 MG CAP
40 mg | ORAL_CAPSULE | Freq: Every day | ORAL | Status: DC
Start: 2011-12-24 — End: 2012-11-12

## 2011-12-24 NOTE — Progress Notes (Signed)
HISTORY OF PRESENT ILLNESS  Eugene Ferguson is a 56 y.o. male.  HPI  Patient is here to get establish, hx Hep C liver chirrosis , f/u w/ hepatologist.  He has c/o lower back pain, hx of chronic back pain, seen by pain clinic in the past, back pain has gotten worse recently due to slipped and fell about 2 weeks ago, no LOC, pain is non radiating , worse w/ standing and at night, NL urination and BM.  Hx psych disorder doing OK w/ meds need refill.  Review of Systems   Constitutional: Negative.    HENT: Negative.    Eyes: Negative.    Respiratory: Negative.    Cardiovascular: Negative.    Gastrointestinal: Negative.    Genitourinary: Negative.    Musculoskeletal: Positive for back pain.   Neurological: Negative.    Psychiatric/Behavioral: Negative.        Physical Exam   Constitutional: He is oriented to person, place, and time. No distress.   HENT:   Right Ear: External ear normal.   Left Ear: External ear normal.   Mouth/Throat: Oropharynx is clear and moist.   Eyes: Pupils are equal, round, and reactive to light.   Neck: Normal range of motion. Neck supple.   Cardiovascular: Normal rate, regular rhythm and normal heart sounds.    Pulmonary/Chest: Effort normal and breath sounds normal. He has no wheezes. He has no rales.   Abdominal: Soft. Bowel sounds are normal. He exhibits distension. There is no tenderness.   Musculoskeletal: He exhibits tenderness. He exhibits no edema.        Lumbar tenderness w/ palpation   Lymphadenopathy:     He has no cervical adenopathy.   Neurological: He is alert and oriented to person, place, and time.   Psychiatric: He has a normal mood and affect.       ASSESSMENT and PLAN  1. Back pain  XR SPINE LUMB 2 OR 3 V, methocarbamol (ROBAXIN) 750 mg tablet, HYDROcodone-acetaminophen (NORCO) 5-325 mg per tablet   2. Hepatic cirrhosis     3. Chronic back pain  REFERRAL TO PAIN CLINIC   4. Psychiatric disorder  FLUoxetine (PROZAC) 40 mg capsule, QUEtiapine SR (SEROQUEL XR) 300 mg sr tablet      Encounter Diagnoses   Name Primary?   ??? Back pain Yes   ??? Hepatic cirrhosis    ??? Chronic back pain    ??? Psychiatric disorder

## 2011-12-24 NOTE — Patient Instructions (Signed)
Back Pain: After Your Visit  Your Care Instructions  Back pain has many possible causes. It is often related to problems with muscles and ligaments of the back. It may also be related to problems with the nerves, discs, or bones of the back. Moving, lifting, standing, sitting, or sleeping in an awkward way can strain the back. Sometimes you don't notice the injury until later. Arthritis is another common cause of back pain.  Although it may hurt a lot, back pain usually improves on its own within several weeks. Most people recover in 12 weeks or less. Using good home treatment and being careful not to stress your back can help you feel better sooner.  Follow-up care is a key part of your treatment and safety. Be sure to make and go to all appointments, and call your doctor if you are having problems. It???s also a good idea to know your test results and keep a list of the medicines you take.  How can you care for yourself at home?  ?? Sit or lie in positions that are most comfortable and reduce your pain. Try one of these positions when you lie down:   ?? Lie on your back with your knees bent and supported by large pillows.   ?? Lie on the floor with your legs on the seat of a sofa or chair.   ?? Lie on your side with your knees and hips bent and a pillow between your legs.   ?? Lie on your stomach if it does not make pain worse.   ?? Do not sit up in bed, and avoid soft couches and twisted positions. Bed rest can help relieve pain at first, but it delays healing. Avoid bed rest after the first day of back pain.   ?? Change positions every 30 minutes. If you must sit for long periods of time, take breaks from sitting. Get up and walk around, or lie in a comfortable position.   ?? Try using a heating pad on a low or medium setting for 15 to 20 minutes every 2 or 3 hours. Try a warm shower in place of one session with the heating pad.   ?? You can also try an ice pack for 10 to 15 minutes every 2 to 3 hours. Put a thin cloth  between the ice pack and your skin.   ?? Take pain medicines exactly as directed.   ?? If the doctor gave you a prescription medicine for pain, take it as prescribed.   ?? If you are not taking a prescription pain medicine, ask your doctor if you can take an over-the-counter medicine.   ?? Take short walks several times a day. You can start with 5 to 10 minutes, 3 or 4 times a day, and work up to longer walks. Walk on level surfaces and avoid hills and stairs until your back is better.   ?? Return to work and other activities as soon as you can. Continued rest without activity is usually not good for your back.   ?? To prevent future back pain, do exercises to stretch and strengthen your back and stomach. Learn how to use good posture, safe lifting techniques, and proper body mechanics.   When should you call for help?  Call your doctor now or seek immediate medical care if:  ?? You have new or worsening numbness in your legs.   ?? You have new or worsening weakness in your legs. (This could make it   hard to stand up.)   ?? You lose control of your bladder or bowels.   Watch closely for changes in your health, and be sure to contact your doctor if:  ?? Your pain gets worse.   ?? You are not getting better after 2 weeks.     Where can you learn more?    Go to http://www.healthwise.net/[facility]   Enter I594 in the search box to learn more about "Back Pain: After Your Visit."    ?? 2006-2012 Healthwise, Incorporated. Care instructions adapted under license by [employer] (which disclaims liability or warranty for this information). This care instruction is for use with your licensed healthcare professional. If you have questions about a medical condition or this instruction, always ask your healthcare professional. Healthwise, Incorporated disclaims any warranty or liability for your use of this information.  Content Version: 9.5.76532; Last Revised: February 22, 2010          Back Stretches: Exercises  Your Care Instructions  Here  are some examples of exercises for stretching your back. Start each exercise slowly. Ease off the exercise if you start to have pain.  Your doctor or physical therapist will tell you when you can start these exercises and which ones will work best for you.  How to do the exercises  Overhead stretch    1. Stand comfortably with your feet shoulder-width apart.   2. Looking straight ahead, raise both arms over your head and reach toward the ceiling. Do not allow your head to tilt back.   3. Hold for 15 to 30 seconds, then lower your arms to your sides.   4. Repeat 2 to 4 times.   Side stretch    1. Stand comfortably with your feet shoulder-width apart.   2. Raise one arm over your head, and then lean to the other side.   3. Slide your hand down your leg as you let the weight of your arm gently stretch your side muscles. Hold for 15 to 30 seconds.   4. Repeat 2 to 4 times on each side.   Press-up    1. Lie on your stomach, supporting your body with your forearms.   2. Press your elbows down into the floor to raise your upper back. As you do this, relax your stomach muscles and allow your back to arch without using your back muscles. As your press up, do not let your hips or pelvis come off the floor.   3. Hold for 15 to 30 seconds, then relax.   4. Repeat 2 to 4 times.   Relax and rest    1. Lie on your back with a rolled towel under your neck and a pillow under your knees. Extend your arms comfortably to your sides.   2. Relax and breathe normally.   3. Remain in this position for about 10 minutes.   4. If you can, do this 2 or 3 times each day.   Follow-up care is a key part of your treatment and safety. Be sure to make and go to all appointments, and call your doctor if you are having problems. It's also a good idea to know your test results and keep a list of the medicines you take.    Where can you learn more?    Go to http://www.healthwise.net/[facility]   Enter Y090 in the search box to learn more about "Back  Stretches: Exercises."    ?? 2006-2012 Healthwise, Incorporated. Care instructions adapted under license by   [place] (which disclaims liability or warranty for this information). This care instruction is for use with your licensed healthcare professional. If you have questions about a medical condition or this instruction, always ask your healthcare professional. Healthwise, Incorporated disclaims any warranty or liability for your use of this information.  Content Version: 9.5.76532; Last Revised: October 01, 2010

## 2012-11-09 ENCOUNTER — Inpatient Hospital Stay
Admit: 2012-11-09 | Discharge: 2012-11-12 | Disposition: A | Discharge status: Home / Self Care | Payer: PRIVATE HEALTH INSURANCE | Attending: Psychiatry | Admitting: Psychiatry

## 2012-11-09 DIAGNOSIS — F201 Disorganized schizophrenia: Secondary | ICD-10-CM

## 2012-11-09 LAB — POC CHEM8
Anion gap (POC): 13 mmol/L (ref 5–15)
BUN (POC): 10 MG/DL (ref 9–20)
CO2 (POC): 28 MMOL/L (ref 21–32)
Calcium, ionized (POC): 1.15 MMOL/L (ref 1.12–1.32)
Chloride (POC): 106 MMOL/L (ref 98–107)
Creatinine (POC): 0.8 MG/DL (ref 0.6–1.3)
GFRAA, POC: >60 mL/min/{1.73_m2} (ref >60)
GFRNA, POC: >60 mL/min/{1.73_m2} (ref >60)
Glucose (POC): 98 MG/DL (ref 75–110)
Hematocrit (POC): 49 % (ref 36.6–50.3)
Hemoglobin (POC): 16.7 GM/DL (ref 12.1–17.0)
Potassium (POC): 4.2 MMOL/L (ref 3.5–5.1)
Sodium (POC): 142 MMOL/L (ref 136–145)

## 2012-11-09 LAB — DRUG SCREEN, URINE
AMPHETAMINES: NEGATIVE
BARBITURATES: NEGATIVE
BENZODIAZEPINES: NEGATIVE
COCAINE: NEGATIVE
METHADONE: NEGATIVE
OPIATES: NEGATIVE
PCP(PHENCYCLIDINE): NEGATIVE
THC (TH-CANNABINOL): NEGATIVE

## 2012-11-09 LAB — ETHYL ALCOHOL: ALCOHOL(ETHYL),SERUM: <10 MG/DL (ref <10)

## 2012-11-09 MED ADMIN — pneumococcal 23-valent (PNEUMOVAX 23) injection 0.5 mL: INTRAMUSCULAR | @ 18:00:00 | NDC 00006494301

## 2012-11-09 MED FILL — VENTOLIN HFA 90 MCG/ACTUATION AEROSOL INHALER: 90 mcg/actuation | RESPIRATORY_TRACT | Qty: 8

## 2012-11-09 MED FILL — PNEUMOVAX-23 25 MCG/0.5 ML INJECTION SOLUTION: 25 mcg/0.5 mL | INTRAMUSCULAR | Qty: 0.5
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
## 2012-11-09 NOTE — Behavioral Health Treatment Team (Cosign Needed)
GROUP THERAPY PROGRESS NOTE    The patient Eugene Ferguson is participating in Reflection Group.    Group time: 30 minutes    Personal goal for participation: Personal    Goal orientation: Reflection    Group therapy participation: Active    Therapeutic interventions reviewed and discussed: Yes    ETHEL BARNETT-JOHNSON  11/09/2012
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
## 2012-11-09 NOTE — ED Notes (Signed)
Patient given breakfast tray.
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
## 2012-11-09 NOTE — ED Notes (Signed)
TRANSFER - OUT REPORT:    Verbal report given to Benita, RN (name) on Sumeet Geter  being transferred to Behavioral Health(unit) for routine progression of care       Report consisted of patient???s Situation, Background, Assessment and   Recommendations(SBAR).     Information from the following report(s) SBAR and ED Summary was reviewed with the receiving nurse.    Opportunity for questions and clarification was provided.
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
## 2012-11-09 NOTE — ED Notes (Signed)
TRANSFER - OUT REPORT:    Verbal report given to Lajuana Ripple, RN on Eugene Ferguson for routine progression of care.      Report consisted of patient???s Situation, Background, Assessment and   Recommendations(SBAR).     Information from the following report(s) SBAR, Recent Results and Med Rec Status was reviewed with the receiving nurse.    Opportunity for questions and clarification was provided.

## 2012-11-10 LAB — LIPID PANEL
CHOL/HDL Ratio: 6.1 — ABNORMAL HIGH (ref 0–5.0)
Cholesterol, total: 183 MG/DL (ref <200)
HDL Cholesterol: 30 MG/DL
LDL, calculated: 119.8 MG/DL — ABNORMAL HIGH (ref 0–100)
Triglyceride: 166 MG/DL — ABNORMAL HIGH (ref <150)
VLDL, calculated: 33.2 MG/DL

## 2012-11-10 LAB — CBC W/O DIFF
HCT: 48.4 % (ref 36.6–50.3)
HGB: 16.6 g/dL (ref 12.1–17.0)
MCH: 30.4 PG (ref 26.0–34.0)
MCHC: 34.3 g/dL (ref 30.0–36.5)
MCV: 88.6 FL (ref 80.0–99.0)
PLATELET: 155 10*3/uL (ref 150–400)
RBC: 5.46 M/uL (ref 4.10–5.70)
RDW: 14.4 % (ref 11.5–14.5)
WBC: 7.4 10*3/uL (ref 4.1–11.1)

## 2012-11-10 LAB — HEPATIC FUNCTION PANEL
A-G Ratio: 0.9 — ABNORMAL LOW (ref 1.1–2.2)
ALT (SGPT): 86 U/L — ABNORMAL HIGH (ref 12–78)
AST (SGOT): 56 U/L — ABNORMAL HIGH (ref 15–37)
Albumin: 3.4 g/dL — ABNORMAL LOW (ref 3.5–5.0)
Alk. phosphatase: 84 U/L (ref 50–136)
Bilirubin, direct: 0.2 MG/DL (ref 0.0–0.2)
Bilirubin, total: 1 MG/DL (ref 0.2–1.0)
Globulin: 3.9 g/dL (ref 2.0–4.0)
Protein, total: 7.3 g/dL (ref 6.4–8.2)

## 2012-11-10 LAB — AMMONIA: Ammonia, plasma: 34 umol/L — ABNORMAL HIGH (ref <32)

## 2012-11-10 LAB — PROTHROMBIN TIME + INR
INR: 1.1 (ref 0.9–1.1)
Prothrombin time: 10.7 s (ref 9.4–11.7)

## 2012-11-10 LAB — TSH 3RD GENERATION: TSH: 2.11 u[IU]/mL (ref 0.36–3.74)

## 2012-11-10 LAB — PTT: aPTT: 23.6 s (ref 23.0–30.0)

## 2012-11-10 MED ADMIN — propranolol (INDERAL) tablet 20 mg: ORAL | @ 14:00:00 | NDC 50111046701

## 2012-11-10 MED ADMIN — propranolol (INDERAL) tablet 20 mg: ORAL | @ 03:00:00 | NDC 50111046701

## 2012-11-10 MED ADMIN — pantoprazole (PROTONIX) tablet 40 mg: ORAL | @ 11:00:00 | NDC 00008060704

## 2012-11-10 MED ADMIN — propranolol (INDERAL) tablet 20 mg: ORAL | @ 22:00:00 | NDC 50111046701

## 2012-11-10 MED ADMIN — QUEtiapine SR (SEROQUEL XR) TABLET 200 mg: ORAL | @ 02:00:00 | NDC 00310028260

## 2012-11-10 MED ADMIN — FLUoxetine (PROZAC) capsule 20 mg: ORAL | @ 14:00:00 | NDC 00904578561

## 2012-11-10 MED FILL — PROPRANOLOL 10 MG TAB: 10 mg | ORAL | Qty: 2

## 2012-11-10 MED FILL — PROTONIX 40 MG TABLET,DELAYED RELEASE: 40 mg | ORAL | Qty: 1

## 2012-11-10 MED FILL — SEROQUEL XR 200 MG TABLET,EXTENDED RELEASE: 200 mg | ORAL | Qty: 1

## 2012-11-10 MED FILL — FLUOXETINE 20 MG CAP: 20 mg | ORAL | Qty: 1

## 2012-11-10 NOTE — Progress Notes (Signed)
Was seen in the rounds and he denied hearing voices, but was still depressed, not suicidal. D/c on Friday.

## 2012-11-10 NOTE — Behavioral Health Treatment Team (Signed)
Pt rested during the night with eyes closed. Will continue to monitor.

## 2012-11-10 NOTE — Progress Notes (Signed)
Problem: Altered Thought Process (Adult/Pediatric)  Goal: *STG: Complies with medication therapy  Outcome: Progressing Towards Goal  Pt is alert and oriented x 4.  Mood is depressed.  Pt isolates to himself.  He only comes out of his room for meals and medications.  BP this afternoon was low so info was given to Charge Nurse for pass on report.  He was encouraged to drink fluids. Staff will continue to monitor pt's BP for improvement.  He currently denies hallucinations/delusions and denies suicidal/homicidal ideations.  Staff will continue to monitor pt for safety and needs while pt is on the unit.

## 2012-11-10 NOTE — Behavioral Health Treatment Team (Signed)
Pt did not attend Community meeting

## 2012-11-10 NOTE — Progress Notes (Signed)
Problem: Altered Thought Process (Adult/Pediatric)  Goal: *STG: Decreased hallucinations  Outcome: Resolved/Met Date Met:  11/10/12  Pt is alert and oriented.Affect flat, mood anxious. Denies SI/HI and contracts for safety. Pt denies psychotic symptoms. Pt is able to reality test. Pt is compliant with medication and meals. Pt attends groups and activities. Pt interacts appropriately with staff and peers. Pt is self care with ADLS.Continue to assess mood an behavior. Assist to reality test. Monitor on Q 15 checks.

## 2012-11-10 NOTE — Behavioral Health Treatment Team (Signed)
Psychiatric Progress Note    Date: 11/10/2012  Account Number:  [id number]  Name: Eugene Ferguson      PSYCHOTHERAPY SESSION:  Length of psychotherapy session: 25 minutes    Psychoeducation provided.     Treatment plan reviewed with patient---including diagnosis, treatment options and medications.     Supportive/Cognitive/Reality-Oriented psychotherapy provided in regards to various psychosocial stressors:   pre-admission and current problems   Housing issues   Occupational issues   Academic issues   Legal issues   Medical issues   Interpersonal conflicts   Stress of hospitalization              Worked on issues of denial & effects of substance dependency/use      Extended energy and skill set needed to engage pt in psychotherapy due to the following: resistiveness, complexity, resistance, confrontational behaviors, and/or severe abnormalities in thought processes/psychosis.               E & M PROGRESS NOTE:  To include the following:   Coordinated treatment team rounds conducted with patient, nurses, and/or social worker present; Discussions held with unit pharmacist, case manager, and/or with family members; Chart reviewed in full including consultant notes, ancillary staff notes, vitals and labs in connect care EMR reviewed in full.         SUBJECTIVE:   Patient reports the following:  anxiety   Patient denies the following:  suicidal thoughts/threats, agitation, delusions, psychotic behavior, self mutilation, paranoid behavior, mania and hypomania     Appetite:improved   Sleep: improved     Side Effects:  None reported or admitted to.    OBJECTIVE:                   Mental Status exam: WNL except for    Sensorium  oriented to time, place and person   Relations cooperative and guarded   Appearance:  casually dressed and disheveled   Motor Behavior:  hypoactive   Speech:  hypoverbal   Thought Process: goal directed and logical   Thought Content free of delusions and free of hallucinations   Suicidal ideations none  and contracts for safety   Homicidal ideations no intention, none and contracts for safety   Mood:  irritable   Affect:  irritable   Memory recent  adequate   Memory remote:  adequate   Concentration:  adequate   Abstraction:  abstract   Insight:  limited   Reliability fair   Judgment:  limited     Pertinent data:  Patient Vitals for the past 8 hrs:   BP Pulse Resp   11/10/12 1341 87/60 mmHg 73 16     Recent Results (from the past 24 hour(s))   TSH, 3RD GENERATION    Collection Time    11/10/12  6:00 AM       Result Value Range    TSH 2.11  0.36 - 3.74 uIU/mL   LIPID PANEL    Collection Time    11/10/12  6:00 AM       Result Value Range    LIPID PROFILE          Cholesterol, total 183  <200 MG/DL    Triglyceride 161 (*) <150 MG/DL    HDL Cholesterol 30      LDL, calculated 119.8 (*) 0 - 100 MG/DL    VLDL, calculated 09.6      CHOL/HDL Ratio 6.1 (*) 0 - 5.0     CBC W/O DIFF  Collection Time    11/10/12  6:00 AM       Result Value Range    WBC 7.4  4.1 - 11.1 K/uL    RBC 5.46  4.10 - 5.70 M/uL    HGB 16.6  12.1 - 17.0 g/dL    HCT 96.0  45.4 - 09.8 %    MCV 88.6  80.0 - 99.0 FL    MCH 30.4  26.0 - 34.0 PG    MCHC 34.3  30.0 - 36.5 g/dL    RDW 11.9  14.7 - 82.9 %    PLATELET 155  150 - 400 K/uL   HEPATIC FUNCTION PANEL    Collection Time    11/10/12  6:00 AM       Result Value Range    Protein, total 7.3  6.4 - 8.2 g/dL    Albumin 3.4 (*) 3.5 - 5.0 g/dL    Globulin 3.9  2.0 - 4.0 g/dL    A-G Ratio 0.9 (*) 1.1 - 2.2      Bilirubin, total 1.0  0.2 - 1.0 MG/DL    Bilirubin, direct 0.2  0.0 - 0.2 MG/DL    Alk. phosphatase 84  50 - 136 U/L    AST 56 (*) 15 - 37 U/L    ALT 86 (*) 12 - 78 U/L   AMMONIA    Collection Time    11/10/12  6:00 AM       Result Value Range    Ammonia 34 (*) <32 UMOL/L   PROTHROMBIN TIME    Collection Time    11/10/12  6:00 AM       Result Value Range    INR 1.1  0.9 - 1.1      Prothrombin time 10.7  9.4 - 11.7 sec   PTT    Collection Time    11/10/12  6:00 AM       Result Value Range    aPTT 23.6   23.0 - 30.0 sec    aPTT, therapeutic range      58.0 - 77.0 SECS       Medications:  Current Facility-Administered Medications   Medication Dose Route Frequency   ??? QUEtiapine SR (SEROQUEL XR) tablet 300 mg  300 mg Oral QHS   ??? albuterol (PROVENTIL HFA, VENTOLIN HFA) inhaler 2 Puff  2 Puff Inhalation Q4H PRN   ??? pantoprazole (PROTONIX) tablet 40 mg  40 mg Oral ACB   ??? ziprasidone (GEODON) 20 mg in sterile water (preservative free) injection  20 mg IntraMUSCular Q6H PRN   ??? OLANZapine (ZYPREXA) tablet 10 mg  10 mg Oral Q6H PRN   ??? benztropine (COGENTIN) tablet 2 mg  2 mg Oral BID PRN   ??? benztropine (COGENTIN) injection 2 mg  2 mg IntraMUSCular Q6H PRN   ??? LORazepam (ATIVAN) injection 2 mg  2 mg IntraMUSCular Q4H PRN   ??? LORazepam (ATIVAN) tablet 2 mg  2 mg Oral Q4H PRN   ??? zolpidem (AMBIEN) tablet 10 mg  10 mg Oral QHS PRN   ??? acetaminophen (TYLENOL) tablet 650 mg  650 mg Oral Q4H PRN   ??? ibuprofen (MOTRIN) tablet 800 mg  800 mg Oral Q6H PRN   ??? magnesium hydroxide (MILK OF MAGNESIA) oral suspension 30 mL  30 mL Oral DAILY PRN   ??? nicotine (NICODERM CQ) 14 mg/24 hr patch 1 Patch  1 Patch TransDERmal DAILY PRN   ??? albuterol (PROVENTIL HFA, VENTOLIN HFA) inhaler 2 Puff  2 Puff Inhalation Q4H PRN   ??? FLUoxetine (PROZAC) capsule 20 mg  20 mg Oral DAILY   ??? propranolol (INDERAL) tablet 20 mg  20 mg Oral BID       Scheduled Medications:  Current Facility-Administered Medications   Medication Dose Route Frequency   ??? QUEtiapine SR (SEROQUEL XR) tablet 300 mg  300 mg Oral QHS   ??? pantoprazole (PROTONIX) tablet 40 mg  40 mg Oral ACB   ??? FLUoxetine (PROZAC) capsule 20 mg  20 mg Oral DAILY   ??? propranolol (INDERAL) tablet 20 mg  20 mg Oral BID         ASSESSMENT/PLAN:   Nursing staff reports the following:  no acute psychosis or dep noted.    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 Irritable, sl odd  demeanor. Suspect secondary gain for being here in hosp at this time    Plan: cont to adjust psych meds   Non-compliance with treatment (12/05/2009)    Assessment: chronic    Plan: encourage compliance      The following regarding medications was addressed during rounds:   the risks and benefits of the proposed medication  patient given opportunity to ask questions    Will continue to adjust psychiatric and non-psychiatric medications   (see above "Objective" section for details) as deemed appropriate---  based upon diagnoses and response to treatment.     Will continue to provide individual, milieu, occupational, group, and   substance abuse therapies to address target symptoms as deemed   appropriate for the individual patient.    Expected Discharge Date (Day)/Estimated Length of Stay: Friday     Signed By: Mackie Pai, MD

## 2012-11-11 MED ADMIN — pantoprazole (PROTONIX) tablet 40 mg: ORAL | @ 11:00:00 | NDC 00008060704

## 2012-11-11 MED ADMIN — FLUoxetine (PROZAC) capsule 20 mg: ORAL | @ 14:00:00 | NDC 00904578561

## 2012-11-11 MED ADMIN — propranolol (INDERAL) tablet 20 mg: ORAL | @ 14:00:00 | NDC 50111046701

## 2012-11-11 MED ADMIN — QUEtiapine SR (SEROQUEL XR) tablet 300 mg: ORAL | @ 03:00:00 | NDC 00310028360

## 2012-11-11 MED FILL — PROTONIX 40 MG TABLET,DELAYED RELEASE: 40 mg | ORAL | Qty: 1

## 2012-11-11 MED FILL — FLUOXETINE 20 MG CAP: 20 mg | ORAL | Qty: 1

## 2012-11-11 MED FILL — SEROQUEL XR 300 MG TABLET,EXTENDED RELEASE: 300 mg | ORAL | Qty: 1

## 2012-11-11 MED FILL — PROPRANOLOL 10 MG TAB: 10 mg | ORAL | Qty: 2

## 2012-11-11 NOTE — Behavioral Health Treatment Team (Cosign Needed)
GROUP THERAPY PROGRESS NOTE    Eugene Ferguson is participating in Leisure-Creative Group.     Group time: 1 hour    Personal goal for participation: appropriate peer interaction while doing activities    Goal orientation: social    Group therapy participation: Did not attend    Therapeutic interventions reviewed and discussed: yes    Impression of participation: N/A

## 2012-11-11 NOTE — Behavioral Health Treatment Team (Signed)
Nursing Note:  Pt did not attend Nursing Education group.
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
## 2012-11-11 NOTE — Behavioral Health Treatment Team (Signed)
Remained in bed quiet all night, monitored q 15 minutes for safety

## 2012-11-11 NOTE — Behavioral Health Treatment Team (Cosign Needed)
GROUP THERAPY PROGRESS NOTE    Eugene Ferguson is participating in Goals Group.     Group time:  30 min    Personal goal for participation: to set daily goal    Goal orientation:  personal    Group therapy participation: Did not attend    Therapeutic interventions reviewed and discussed: YES    Impression of participation: N/A

## 2012-11-11 NOTE — Behavioral Health Treatment Team (Signed)
Psychiatric Progress Note    Date: 11/11/2012  Account Number:  [id number]  Name: Eugene Ferguson      PSYCHOTHERAPY SESSION:  Length of psychotherapy session: 20 minutes    Psychoeducation provided.     Treatment plan reviewed with patient---including diagnosis, treatment options and medications.     Supportive/Cognitive/Reality-Oriented psychotherapy provided in regards to various psychosocial stressors:   pre-admission and current problems   Housing issues   Occupational issues   Academic issues   Legal issues   Medical issues   Interpersonal conflicts   Stress of hospitalization              Worked on issues of denial & effects of substance dependency/use      Extended energy and skill set needed to engage pt in psychotherapy due to the following: resistiveness, complexity, resistance, confrontational behaviors, and/or severe abnormalities in thought processes/psychosis.               E & M PROGRESS NOTE:  To include the following:   Coordinated treatment team rounds conducted with patient, nurses, and/or social worker present; Discussions held with unit pharmacist, case manager, and/or with family members; Chart reviewed in full including consultant notes, ancillary staff notes, vitals and labs in connect care EMR reviewed in full.         SUBJECTIVE:   Patient reports the following:  anxiety   Patient denies the following:  suicidal thoughts/threats, agitation, delusions, psychotic behavior, self mutilation, paranoid behavior, mania and hypomania     Appetite:improved   Sleep: improved     Side Effects:  None reported or admitted to.    OBJECTIVE:                   Mental Status exam: WNL except for    Sensorium  oriented to time, place and person   Relations cooperative and guarded   Appearance:  casually dressed and disheveled   Motor Behavior:  hypoactive   Speech:  hypoverbal   Thought Process: goal directed and logical   Thought Content free of delusions and free of hallucinations   Suicidal ideations none  and contracts for safety   Homicidal ideations no intention, none and contracts for safety   Mood:  Irritable, euthymic   Affect:  Euthymic, constricted, friendly   Memory recent  adequate   Memory remote:  adequate   Concentration:  adequate   Abstraction:  abstract   Insight:  limited   Reliability fair   Judgment:  limited     Pertinent data:  Patient Vitals for the past 8 hrs:   BP Temp Pulse Resp   11/11/12 0850 99/69 mmHg - 86 -   11/11/12 0645 74/57 mmHg 96.2 ??F (35.7 ??C) 80 18     No results found for this or any previous visit (from the past 24 hour(s)).    Medications:  Current Facility-Administered Medications   Medication Dose Route Frequency   ??? QUEtiapine SR (SEROQUEL XR) tablet 300 mg  300 mg Oral QHS   ??? albuterol (PROVENTIL HFA, VENTOLIN HFA) inhaler 2 Puff  2 Puff Inhalation Q4H PRN   ??? pantoprazole (PROTONIX) tablet 40 mg  40 mg Oral ACB   ??? ziprasidone (GEODON) 20 mg in sterile water (preservative free) injection  20 mg IntraMUSCular Q6H PRN   ??? OLANZapine (ZYPREXA) tablet 10 mg  10 mg Oral Q6H PRN   ??? benztropine (COGENTIN) tablet 2 mg  2 mg Oral BID PRN   ???  benztropine (COGENTIN) injection 2 mg  2 mg IntraMUSCular Q6H PRN   ??? LORazepam (ATIVAN) injection 2 mg  2 mg IntraMUSCular Q4H PRN   ??? LORazepam (ATIVAN) tablet 2 mg  2 mg Oral Q4H PRN   ??? zolpidem (AMBIEN) tablet 10 mg  10 mg Oral QHS PRN   ??? acetaminophen (TYLENOL) tablet 650 mg  650 mg Oral Q4H PRN   ??? ibuprofen (MOTRIN) tablet 800 mg  800 mg Oral Q6H PRN   ??? magnesium hydroxide (MILK OF MAGNESIA) oral suspension 30 mL  30 mL Oral DAILY PRN   ??? nicotine (NICODERM CQ) 14 mg/24 hr patch 1 Patch  1 Patch TransDERmal DAILY PRN   ??? albuterol (PROVENTIL HFA, VENTOLIN HFA) inhaler 2 Puff  2 Puff Inhalation Q4H PRN   ??? FLUoxetine (PROZAC) capsule 20 mg  20 mg Oral DAILY       Scheduled Medications:  Current Facility-Administered Medications   Medication Dose Route Frequency   ??? QUEtiapine SR (SEROQUEL XR) tablet 300 mg  300 mg Oral QHS   ???  pantoprazole (PROTONIX) tablet 40 mg  40 mg Oral ACB   ??? FLUoxetine (PROZAC) capsule 20 mg  20 mg Oral DAILY         ASSESSMENT/PLAN:   Nursing staff reports the following:  no acute psychosis or dep noted.    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  Denies s/i or h/i. No sig mood issues at present.  no acute del or hal. Irritable, sl odd demeanor. Suspect secondary gain for being here in hosp at this time    Plan: cont to adjust psych meds   Non-compliance with treatment (12/05/2009)    Assessment: chronic    Plan: encourage compliance      The following regarding medications was addressed during rounds:   the risks and benefits of the proposed medication  patient given opportunity to ask questions    Will continue to adjust psychiatric and non-psychiatric medications   (see above "Objective" section for details) as deemed appropriate---  based upon diagnoses and response to treatment.     Will continue to provide individual, milieu, occupational, group, and   substance abuse therapies to address target symptoms as deemed   appropriate for the individual patient.    Expected Discharge Date (Day)/Estimated Length of Stay: Friday     Signed By: Mackie Pai, MD

## 2012-11-11 NOTE — Behavioral Health Treatment Team (Signed)
nsg note  Client out and about a lillie this pm.  Became very agitated quickly when addressed client that it looked like he has been in the same clothing for days.  Blew up quickly and then went in his room.  Next time he came out, he was in regular clothing and complimented  Him with the fact that he now looks like the person being discharged tomorrow.  Apologized for earlier and agreed he did look and feel better.  Meal and med compliant, q 15 min checks for safety continue.

## 2012-11-11 NOTE — Progress Notes (Signed)
Behavioral Health Progress Note    The patient Eugene Ferguson is cooperative,alert and oriented.mood and affect is sad. Verbalized, not feeling suicidal/homicidal. The patient has been compliant to their scheduled meals and medications.  [name]  remained in his bed most of the time, sleeping.     Gloriann Loan, RN  11/11/2012

## 2012-11-12 MED ADMIN — QUEtiapine SR (SEROQUEL XR) tablet 300 mg: ORAL | @ 03:00:00 | NDC 00310028360

## 2012-11-12 MED ADMIN — FLUoxetine (PROZAC) capsule 20 mg: ORAL | @ 15:00:00 | NDC 00904578561

## 2012-11-12 MED ADMIN — pantoprazole (PROTONIX) tablet 40 mg: ORAL | @ 11:00:00 | NDC 00008060704

## 2012-11-12 MED FILL — SEROQUEL XR 300 MG TABLET,EXTENDED RELEASE: 300 mg | ORAL | Qty: 1

## 2012-11-12 MED FILL — PROTONIX 40 MG TABLET,DELAYED RELEASE: 40 mg | ORAL | Qty: 1

## 2012-11-12 MED FILL — FLUOXETINE 20 MG CAP: 20 mg | ORAL | Qty: 1

## 2012-11-12 NOTE — Behavioral Health Treatment Team (Signed)
Patient has been educated on home discharge instructions. Patient Verbalizes understanding and denies S/H ideation. All questions have been answered and all personal belongings and property has been returned to the patient.

## 2012-11-12 NOTE — Behavioral Health Treatment Team (Signed)
Slept thru out the night shift.  No c/o pain/discomfort.  Chart check completed.

## 2012-11-12 NOTE — Progress Notes (Signed)
Behavioral Health Progress Note    The patient Eugene Ferguson is cooperative,alert and oriented.mood and affect is appropriate.  He was seen by treatment team and [name] verbalized he was feeling good. He is for discharge and he signed his d/c papers and will f/up out patient.  Verbalized, not feeling suicidal/homicidal. The patient has been compliant to their scheduled meals and medications.  [name] attends group meetings.   Gloriann Loan, RN  11/12/2012

## 2012-11-12 NOTE — Behavioral Health Treatment Team (Signed)
GROUP THERAPY PROGRESS NOTE    Today Eugene Ferguson did not attend nursing group     Reason given was: sleeping    Gloriann Loan, RN  11/12/2012

## 2012-11-12 NOTE — Discharge Summary (Signed)
Name:       Eugene Ferguson, Eugene Ferguson                   Admitted:    11/09/2012                                               Discharged:  11/12/2012  Account #:  [id number]                     DOB:         07/12/1956  Consultant: Gillermina Hu, MD         Age          56                                 DISCHARGE SUMMARY      ROOM: 321    DISCHARGE DIAGNOSES  Axis I: Disorganized schizophrenia, chronic, acute exacerbation; treatment  noncompliance; depressive disorder, not otherwise specified; alcohol  dependency, in remission; tobacco dependency.  Axis II:  Deferred.  Axis III: Obesity; gastroesophageal reflux disease; hepatitis C; liver  cirrhosis.  Axis IV: Homeless; lack of structure.  Axis V: 30 on admission, 60 on discharge.    HISTORY OF PRESENT ILLNESS: Please see dictated psychiatric evaluation by  writer in [facility] dated 11/09/2012, for full details, as well as for a  description of mental status examination at the time of initial  presentation.    HOSPITAL COURSE: The patient was admitted to inpatient psychiatric unit for  acute stabilization and further observation in regards to exacerbation of  psychosis and depressive symptomatology, with thoughts of wanting to harm  himself. The patient was started on psychotropic medications that the  patient claims have helped him well. He has been off of them for a couple  of weeks prior to admission. Prozac reinstituted at 20 mg a day. Seroquel  XR reinstituted and titrated to 300 mg at bedtime, which was his  pre-admission dose a couple of weeks ago. The patient tolerated all  medications well, without any adverse effects. Informed consent given. At  the time of discharge, the patient free of any affective disturbances. No  depression, suicidal or homicidal ideations present. No delusions or  hallucinations. The patient desirous of discharge.    DISCHARGE MEDICATIONS:  1. Prozac 20 mg a day.  2. Protonix 40 mg a day.  3. Seroquel XR 300 mg at bedtime.     DISPOSITION: The patient will be discharged home. The patient to have  appropriate outpatient psychiatric services set up. The patient to follow  with drug and alcohol rehabilitation programming and internal medicine  doctor as directed.        Reviewed on 11/12/2012 4:12 PM              Gillermina Hu, MD    cc:    Gillermina Hu, MD      BRS/wmx; D: 11/12/2012 01:59 P; T: 11/12/2012 02:10 P; DOC# [id number]; Job#  [id number]

## 2012-11-12 NOTE — Discharge Summary (Signed)
Patient seen, staffing held and discharge summary dictated: # [id number]  Please see dictated report for full details. Patient stable for discharge and considered to be at low risk of harm to self or others.  Informed consent given to the use of discharge medications.  Treatment rounds held in full.  Spent greater than 35 minutes on discharge work.

## 2013-06-23 DIAGNOSIS — F201 Disorganized schizophrenia: Secondary | ICD-10-CM

## 2013-06-24 ENCOUNTER — Inpatient Hospital Stay
Admit: 2013-06-24 | Discharge: 2013-06-29 | Disposition: A | Discharge status: Home / Self Care | Payer: MEDICAID | Source: Other Acute Inpatient Hospital | Attending: Geriatric Psychiatry | Admitting: Geriatric Psychiatry

## 2013-06-24 MED ADMIN — LORazepam (ATIVAN) tablet 2 mg: ORAL | @ 17:00:00 | NDC 51079038601

## 2013-06-24 MED ADMIN — thiamine (B-1) tablet 100 mg: ORAL | @ 15:00:00 | NDC 90011015050

## 2013-06-24 MED ADMIN — FLUoxetine (PROZAC) capsule 20 mg: ORAL | @ 15:00:00 | NDC 00904578561

## 2013-06-24 MED ADMIN — folic acid (FOLVITE) tablet 1 mg: ORAL | @ 15:00:00 | NDC 63739053710

## 2013-06-24 MED ADMIN — LORazepam (ATIVAN) tablet 2 mg: ORAL | @ 16:00:00 | NDC 51079038601

## 2013-06-24 MED ADMIN — LORazepam (ATIVAN) tablet 2 mg: ORAL | @ 15:00:00 | NDC 51079038601

## 2013-06-24 MED ADMIN — therapeutic multivitamin (THERAGRAN) tablet 1 Tab: ORAL | @ 15:00:00

## 2013-06-24 NOTE — Behavioral Health Treatment Team (Cosign Needed)
Patient did not participate in community meeting

## 2013-06-24 NOTE — Behavioral Health Treatment Team (Signed)
New admission under TDO status. Presents as cooperative. Reports feeling depressed and suicidal. Able to contract for safety in the hospital. Denies HI. Admits to auditory hallucinations. Admits to ETOH abuse. Poor hygiene. Clothes were filthy. Pt is currently homeless and [place]. Pt reports history of Hep C+. Body and belongings search done. Placed on q39min checks. Will continue to monitor.

## 2013-06-24 NOTE — H&P (Signed)
Name:       Eugene Ferguson, Eugene Ferguson                   Admitted:    06/23/2013    Account #:  [id number]                     DOB:         29-Nov-1955  Physician:  Eugene Lank, MD             Age:         57                               HISTORY AND PHYSICAL      REASON FOR HOSPITALIZATION: The patient was admitted to inpatient  psychiatric unit voluntarily for suicidal ideation and alcohol  intoxication.    CHIEF COMPLAINT: "I don't know why I'm here."    HISTORY OF PRESENT ILLNESS: The patient is a 57 year old single white male  who is currently homeless with a history of schizophrenia, disorganized  type, and alcohol dependence, who was admitted under TDO for suicidal  ideation and alcohol dependence with intoxication.    The patient was seen with the team today. He was cooperative and pleasant.  The patient is disheveled and unkempt and has very poor hygiene. He appears  much older than his stated age. His thoughts are disorganized and loose. He  was not able to clearly state how he ended up in the hospital under TDO.  The patient denies any suicidal or homicidal ideation at this time. The  patient was willing to stay in the hospital and take medication. He  reported that he was last seen by Dr. Leilani Ferguson 1 month ago. The patient has  not been compliant with his medication.    PAST MEDICAL AND SURGICAL HISTORY: Significant for hepatitis C, cirrhosis  of liver, history of traumatic brain injury and GERD. The patient has a  history of asthma.    PAST PSYCHIATRIC HISTORY: Significant for history of schizophrenia,  disorganized type. The patient is currently followed by Dr. Leilani Ferguson in the  community.    PAST SUBSTANCE ABUSE HISTORY: Significant for alcohol dependence and  tobacco dependence. The patient has a long history of alcohol abuse. He  reported that he has never been to any inpatient or outpatient drug rehab  program.    ALLERGIES: NO KNOWN ALLERGIES.    MEDICATIONS PRIOR TO ADMISSION  1. Prozac.  2. Seroquel.  3.  Nexium.  4. Albuterol inhaler.    SOCIAL HISTORY: The patient is divorced. [place] is currently [place] The patient is on disability for many years.    FAMILY HISTORY: The patient denies any family history of mental or  substance abuse illness.  Family history of medical problems reviewed and considered negative.    REVIEW OF SYSTEMS: The patient currently denies any suicidal or homicidal  ideation. He does appear to be preoccupied and is responding to internal  stimuli. The patient denies any depression, anxiety, panic attacks,  obsession or compulsion. Medical review of systems mainly considered within  normal limits.    MENTAL STATUS EXAMINATION  GENERAL PRESENTATION: The patient is alert and oriented to self and place.  His gait is stable. No abnormal muscular movements noted. The patient is  cooperative.  GENERAL APPEARANCE: The patient appears somewhat older than his stated age.  He is unkempt with poor hygiene and has a bad odor coming from him.  LANGUAGE: The patient has no aphasia or dysarthria.  SPEECH: Low volume, fluent and nonpressured.  THOUGHT PROCESSES: Illogical, loose.  THOUGHT CONTENT: Appears to be mildly paranoid and delusional. Suicidal  ideation: None. Homicidal ideation: None.  MOOD: Depressed.  AFFECT: Anxious.  COGNITIVE TESTING: Memory for recent event is compromised. Memory for  remote event appears to be compromised. Concentration is poor. Fund of  knowledge is poor.  INSIGHT: Poor.  RELIABILITY: Poor.  JUDGMENT: Poor.  VITAL SIGNS: Blood pressure is 171/99, pulse is 63, respiration is 18,  temperature is 97.4.    LABORATORY DATA: No current labs done.    X-RAY OF LEFT ANKLE: ORIF.    ASSESSMENT: The patient is a 57 year old divorced white male, homeless,  unemployed on disability, with a history of schizophrenia and alcohol  dependence and intoxication, who was admitted under TDO for suicidal  ideation and intoxication.    ACTIVE PROBLEM LIST  1. Disorganized-type  of schizophrenia, chronic. Assessment: The patient is  mildly disorganized and appears to have residual psychosis. Plan  a. We will continue with the inpatient psychiatric treatment.  b. We will start the patient back on a low dose of Seroquel and Prozac.  2. Alcohol dependence. Assessment: The patient appears to be intoxicated  with blood alcohol level of 253. Plan: We will start the patient on CIWA  protocol.  3. Noncompliance with treatment and assessment. The patient appears to be  cooperative and pleasant at this time. Plan: We will continue to monitor  his compliance with medications.  4. Suicidal ideation. Assessment: The patient denies any active suicidal  thoughts at this time. Continue to monitor.  5. Tobacco dependence. Assessment: The patient is not having any nicotine  withdrawal. Plan: The patient will be offered nicotine patch.    PLAN  1. I agree with the decision to admit the patient.  2. A coordinated multidisciplinary treatment team round was conducted with  the patient. That included the nurse, unit pharmacist, social workers and  Retail banker all present. The following regarding medication was addressed with the  patient, the risks and the benefits of the proposed medication. The patient  was given the opportunity to ask questions. Informed consent was given to  the use of above medication.  3. I will continue to adjust the patient's psychiatric and nonpsychiatric  medication as needed.  4. I reviewed the patient's admission labs. I have continued to order blood  tests and diagnostic tests as deemed appropriate.  5. I will gather additional collateral information from his family and  friends and his outpatient psychiatrist.  6. While on the unit, the patient will be provided with individual, milieu,  occupational, group and substance abuse therapist to address the target  symptoms.    ESTIMATED LENGTH OF STAY: 5 to 7 days.    STRENGTHS  1. The patient is cooperative and pleasant.  2. He has a good  psychiatric followup.                Eugene Lank, MD    cc:                       Eugene Lank, MD      SAL/wmx; D: 06/24/2013 06:06 P; T: 06/24/2013 07:03 P; DOC# [id number]; Job#  [id number]

## 2013-06-24 NOTE — Behavioral Health Treatment Team (Signed)
Social Work    The patient's chart was reviewed with history noted. Pt is a TDO to this hospital. Pt had a BAL os 250+at admission, and has medical and physical concerns related to alcohol dependence.  Patient arrives to Treatment Team and the pt is alert,well-oriented and participates at an active level with the team this date.      [name] shares recent event that prompted this admission. Pt reports that he had been recently evicted from his apartment, and has related stressors that relate to financial, housing, and relationshp areas.  Pt is also candid about his long standing alcohol dependence issues and past treatment for same.      [name] today denies suicidal or homicidal ideation, intent or plan.  Pt reports his mood is feeling better and pt feels safe.    Pt reports currently active with psychiatric treatment of Eugene Kidney, MD [facility].  Pt shares his recent medications and says he has been managing to get and take his meds until recently.

## 2013-06-24 NOTE — Behavioral Health Treatment Team (Signed)
Social Work.    To coordinate care, learn recent compliance & other treatment details such as diagnosis, SW phones pt's reported psychiatrist, P. Leilani Merl, MD at [phone], with message left to please return call to Social worker at  [phone].

## 2013-06-24 NOTE — Behavioral Health Treatment Team (Cosign Needed)
[name] who was absent from reflections meeting    Vanessa A. Mayford Knife  06/24/2013  9:36 PM

## 2013-06-24 NOTE — Progress Notes (Signed)
Problem: Suicide/Homicide (Adult/Pediatric)  Goal: *STG: Remains safe in hospital  Outcome: Progressing Towards Goal  Pt alert and orient x 3, pt has been isolated to his room sleeping most of the time during the shift. Pt was food and med compliant, Pt was appropriate with staff. Pt did receive ativan 2 mg PO x 3 conseqeutive hours as detox alcohol protocol. Pt denied any S/I and H/I at this time, and continues to be on q 15 mins safety check and needs.

## 2013-06-24 NOTE — Behavioral Health Treatment Team (Cosign Needed)
Pt did not attend coping skills group.

## 2013-06-24 NOTE — Behavioral Health Treatment Team (Signed)
Patient seen, chart reviewed, staffing held and dictated report done. Please see dictated report for complete details.    This psychiatric evaluation required 70 minutes, with greater than 50% of   that time involving concert counseling of the patient. The evaluation   included a psychiatric interview of the patient, full review of the chart   and in consultation with family and staff.

## 2013-06-24 NOTE — Behavioral Health Treatment Team (Cosign Needed)
Patient did not attend Goals Group.  PAULETTE J ALLEN  06/24/2013  3:05 PM

## 2013-06-24 NOTE — Behavioral Health Treatment Team (Signed)
Another phone call to this pt's primary psychiatrist, P Ettigi,MD at [phone]. Eugene Ferguson provides update via phone this date on this pt.  Eugene Ferguson has been compliant with treatment at this medical provider, reporter says.  NO missed appointments are viewed in their system for this pt.       Last visit by Eugene Ferguson to Dr. Leilani Merl was June 11,2014 with Diagnoses: Major Depression Recurrent & ADD. This pt told that MD that pt has interrupted his alcoholism with AA and abstinence of 10 years - - told to MD on Sept 2013. Recent meds Adderall 20 mg x 1 morning; Seroquel at 50 mg,1 bedtime and Prozac 40 mg one daily,morning.    Dr. Leilani Merl Fax number is:  [phone]  Next Visit for patient already scheduled: Sept 5, 2014 at 11:30 am.

## 2013-06-24 NOTE — Behavioral Health Treatment Team (Cosign Needed Addendum)
Pt didn't attend community meeting.
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
## 2013-06-24 NOTE — Behavioral Health Treatment Team (Signed)
nsg note  Client has spent most of the shift sleeping.  Out for his meals and meds, Keeping an eye on his detox.  No verbalized complaints this pm.  Alert and oriented.  Q 15 min checks for safety continue.

## 2013-06-25 LAB — HEPATIC FUNCTION PANEL
A-G Ratio: 0.8 — ABNORMAL LOW (ref 1.1–2.2)
ALT (SGPT): 57 U/L (ref 12–78)
AST (SGOT): 54 U/L — ABNORMAL HIGH (ref 15–37)
Albumin: 3.3 g/dL — ABNORMAL LOW (ref 3.5–5.0)
Alk. phosphatase: 74 U/L (ref 45–117)
Bilirubin, direct: 0.1 MG/DL (ref 0.0–0.2)
Bilirubin, total: 0.5 MG/DL (ref 0.2–1.0)
Globulin: 4 g/dL (ref 2.0–4.0)
Protein, total: 7.3 g/dL (ref 6.4–8.2)

## 2013-06-25 MED ADMIN — folic acid (FOLVITE) tablet 1 mg: ORAL | @ 13:00:00 | NDC 63739053710

## 2013-06-25 MED ADMIN — FLUoxetine (PROZAC) capsule 20 mg: ORAL | @ 13:00:00 | NDC 00904578561

## 2013-06-25 MED ADMIN — pantoprazole (PROTONIX) tablet 40 mg: ORAL | @ 13:00:00 | NDC 51079005101

## 2013-06-25 MED ADMIN — thiamine (B-1) tablet 100 mg: ORAL | @ 13:00:00 | NDC 90011015050

## 2013-06-25 MED ADMIN — multivitamin, tx-iron-ca-min (THERA-M w/ IRON) tablet 1 Tab: ORAL | @ 13:00:00 | NDC 96295012867

## 2013-06-25 MED ADMIN — QUEtiapine (SEROQUEL) tablet 100 mg: ORAL | @ 02:00:00 | NDC 68084053211

## 2013-06-25 MED ADMIN — aspirin chewable tablet 81 mg: ORAL | @ 13:00:00 | NDC 63739043401

## 2013-06-25 NOTE — Behavioral Health Treatment Team (Signed)
Pt isolative this shift. A/O x 3.Denies feeling suicidal at this time.Stays mainly in his room.Compliant to oral  Medications.

## 2013-06-25 NOTE — Behavioral Health Treatment Team (Cosign Needed)
GROUP THERAPY PROGRESS NOTE    Eugene Ferguson is participating in relaxation group.     Group time: 30 minutes    Personal goal for participation: provide relaxing environment    Goal orientation: relaxation    Group therapy participation: active    Therapeutic interventions reviewed and discussed: yes    Impression of participation:  Good

## 2013-06-25 NOTE — Behavioral Health Treatment Team (Signed)
Slept approx 7 hrs thru out this night shift.  No c/o inability to sleep.  No c/o pain/discomfort.  Chart check completed.

## 2013-06-25 NOTE — Behavioral Health Treatment Team (Signed)
Pt is visible on the for  meal and med attended no groups today, isolated in bed, no interactions noted.  Pt remains on detox protocol (CIWA).  Pt denies S/H ideations and A/V hallucinations at this time.  Pt remains on Q- 15 minute checks for safety, will continue to monitor and offer support and feedback when needed.

## 2013-06-25 NOTE — Behavioral Health Treatment Team (Signed)
Psychiatric Progress Note      Date: 06/25/2013  Account Number:  [id number]  Name: Eugene Ferguson      PSYCHOTHERAPY SESSION:  Length of psychotherapy session: 10 minutes  Interpersonal relationship issues and psychodynamic conflicts explored.  Supportive psychotherapy provided in regards to various ongoing psychosocial stressors (including some of the following):   pre-admission and current problems   Housing issues   Occupational issues   Academic issues   Legal issues   Medical issues   Interpersonal conflicts   Stress of hospitalization              Worked on issues of denial & effects of substance dependency/use  Cognitive/Behavioral therapy provided  Reality-Oriented psychotherapy provided                                 E & M PROGRESS NOTE:         SUBJECTIVE:   CC: "I am doing better"    HPI/Interval History: (reviewed/updated 06/25/2013)  Eugene Ferguson reports/evidences the following emotional symptoms:  depression, anxiety, psychosis and alcohol dependence.  The symptoms have been present for years and are of moderate severity. The symptoms are constant / intermittent/  in nature.  Additional symptoms include poor concentration and likely precipitated by , treatment noncompliance and psychosocial stressors.  Patient is doing better. He is cooperative and pleasant. Minimal withdrawal. Poor hygiene. No gross psychosis or mania.       Review of Systems:  (reviewed/updated 06/25/2013)  Appetite:good   Sleep: good   All other Review of Systems: - General ROS: positive for  - poor hygiene  Psychological ROS: positive for - anxiety and depression  Respiratory ROS: no cough, shortness of breath, or wheezing  Cardiovascular ROS: no chest pain or dyspnea on exertion  Gastrointestinal ROS: no abdominal pain, change in bowel habits, or black or bloody stools  Neurological ROS: positive for - confusion    Side Effects: (reviewed/updated 06/25/2013)  None reported or admitted to.      Past Medical History: (reviewed/updated 06/25/2013)   Active Ambulatory Problems     Diagnosis Date Noted   ??? Alcohol dependence 12/05/2009   ??? Tobacco dependence 12/05/2009   ??? Non-compliance with treatment 12/05/2009     Resolved Ambulatory Problems     Diagnosis Date Noted   ??? 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 Liver disease    ??? Other ill-defined conditions    ??? Other ill-defined conditions    ??? Asthma    ??? GERD (gastroesophageal reflux disease)    ??? Depression    ??? Psychotic disorder    ??? Substance abuse    ??? Suicidal thoughts      Past medical history has been reviewed (see dictated report) with no additional updates (I asked patient and no additional past medical history provided).    Social History: (reviewed/updated 06/25/2013)   reports that he has been smoking.  He has never used smokeless tobacco. He reports that  drinks alcohol. He reports that he does not use illicit drugs.  History     Social History Narrative   ??? No narrative on file     Social history has been reviewed (see dictated report) with no additional updates (I asked patient and no additional social history provided).    Family History: (reviewed/updated 06/25/2013)  Family history  has been reviewed (see dictated report) with no additional updates (I asked patient and no additional family history provided).    OBJECTIVE:                 MENTAL STATUS EXAM:  (reviewed/updated 06/25/2013 with changes noted below)  FINDINGS WITHIN NORMAL LIMITS (WNL) UNLESS OTHERWISE STATED BELOW:    Orientation oriented to time, place and person   Vital Signs (BP,Pulse, Temp) See below (reviewed)   Gait and Station Within normal limits   Abnormal Muscular Movements/Tone/Behavior No EPS, no Tardive Dyskinesia, no abnormal muscular movements; wnl tone   Relations cooperative and passive   General Appearance:  age appropriate and casually dressed   Language No aphasia or dysarthria   Speech:  normal pitch and normal volume    Thought Processes logical, wnl rate of thoughts, good abstract reasoning and computation   Thought Associations goal directed and logical   Thought Content free of delusions and free of hallucinations   Suicidal Ideations none   Homicidal Ideations none   Mood:  anxious and depressed   Affect:  constricted   Memory recent  impaired   Memory remote:  adequate   Concentration/Attention:  adequate   Fund of Knowledge Fair/average   Insight:  poor   Reliability poor   Judgment:  poor     Pertinent data/vitals:  Patient Vitals for the past 24 hrs:   Temp Pulse Resp BP   06/25/13 0621 96.5 ??F (35.8 ??C) 57 16 118/88 mmHg   06/24/13 2126 96.9 ??F (36.1 ??C) 58 16 145/91 mmHg     No visits with results within 3 Day(s) from this visit.  Latest known visit with results is:    Admission on 11/09/2012, Discharged on 11/12/2012   Component Date Value   ??? AMPHETAMINE 11/09/2012 NEGATIVE     ??? BARBITURATES 11/09/2012 NEGATIVE     ??? BENZODIAZEPINE 11/09/2012 NEGATIVE     ??? COCAINE 11/09/2012 NEGATIVE     ??? METHADONE 11/09/2012 NEGATIVE     ??? OPIATES 11/09/2012 NEGATIVE     ??? PCP(PHENCYCLIDINE) 11/09/2012 NEGATIVE     ??? THC (TH-CANNABINOL) 11/09/2012 NEGATIVE     ??? DRUG SCRN COMMENT 11/09/2012 (NOTE)    ??? ALCOHOL(ETHYL),SERUM 11/09/2012 <10    ??? Calcium, ionized (POC) 11/09/2012 1.15    ??? Sodium (POC) 11/09/2012 142    ??? Potassium (POC) 11/09/2012 4.2    ??? Chloride (POC) 11/09/2012 106    ??? CO2 (POC) 11/09/2012 28    ??? Anion gap (POC) 11/09/2012 13    ??? Glucose (POC) 11/09/2012 98    ??? BUN (POC) 11/09/2012 10    ??? Creatinine (POC) 11/09/2012 0.8    ??? GFR-AA (POC) 11/09/2012 >60    ??? GFR, non-AA (POC) 11/09/2012 >60    ??? Hemoglobin (POC) 11/09/2012 16.7    ??? Hematocrit (POC) 11/09/2012 49    ??? Comment 11/09/2012 Comment Not Indicated.    ??? TSH 11/10/2012 2.11    ??? LIPID PROFILE 11/10/2012        ??? Cholesterol, total 11/10/2012 183    ??? Triglyceride 11/10/2012 166*   ??? HDL Cholesterol 11/10/2012 30    ??? LDL, calculated 11/10/2012 119.8*    ??? VLDL, calculated 11/10/2012 33.2    ??? CHOL/HDL Ratio 11/10/2012 6.1*   ??? WBC 11/10/2012 7.4    ??? RBC 11/10/2012 5.46    ??? HGB 11/10/2012 16.6    ??? HCT 11/10/2012 48.4    ??? MCV 11/10/2012 88.6    ???  [facility] 11/10/2012 30.4    ??? MCHC 11/10/2012 34.3    ??? RDW 11/10/2012 14.4    ??? PLATELET 11/10/2012 155    ??? Protein, total 11/10/2012 7.3    ??? Albumin 11/10/2012 3.4*   ??? Globulin 11/10/2012 3.9    ??? A-G Ratio 11/10/2012 0.9*   ??? Bilirubin, total 11/10/2012 1.0    ??? Bilirubin, direct 11/10/2012 0.2    ??? Alk. phosphatase 11/10/2012 84    ??? AST 11/10/2012 56*   ??? ALT 11/10/2012 86*   ??? Ammonia 11/10/2012 34*   ??? INR 11/10/2012 1.1    ??? Prothrombin time 11/10/2012 10.7    ??? aPTT 11/10/2012 23.6    ??? aPTT, therapeutic range 11/10/2012            XR Results (most recent):    Results from Hospital Encounter encounter on 12/04/10   XR ANKLE LEFT  AP LAT   Narrative **Final Report**      ICD Codes / Adm.Diagnosis: V70.8   / LEFT DISTAL FIBULAR FRACTURE    Examination:  CR ANKLE 2 VWS LT  - [id number] - Dec 05 2010 11:14AM  Accession No:  [id number]  Reason:  surgery      REPORT:  CLINICAL HISTORY: ORIF.    AP and lateral views of the left ankle demonstrate ORIF of the distal   femoral fracture with improved alignment.       IMPRESSION: ORIF.          Signing/Reading Doctor: Gordan Payment [phone])    Approved: Gordan Payment [phone])  12/05/2010                                      Allergies:  No Known Allergies    Medications:  Current Facility-Administered Medications   Medication Dose Route Frequency   ??? pantoprazole (PROTONIX) tablet 40 mg  40 mg Oral ACB   ??? aspirin chewable tablet 81 mg  81 mg Oral DAILY   ??? multivitamin, tx-iron-ca-min (THERA-M w/ IRON) tablet 1 Tab  1 Tab Oral DAILY   ??? ziprasidone (GEODON) 20 mg in sterile water (preservative free) injection  20 mg IntraMUSCular BID PRN   ??? OLANZapine (ZYPREXA) tablet 5 mg  5 mg Oral Q6H PRN   ??? benztropine (COGENTIN) tablet 2 mg  2 mg Oral BID PRN   ??? benztropine  (COGENTIN) injection 2 mg  2 mg IntraMUSCular Q12H PRN   ??? LORazepam (ATIVAN) injection 2 mg  2 mg IntraMUSCular Q4H PRN   ??? LORazepam (ATIVAN) tablet 1 mg  1 mg Oral Q4H PRN   ??? zolpidem (AMBIEN) tablet 10 mg  10 mg Oral QHS PRN   ??? acetaminophen (TYLENOL) tablet 650 mg  650 mg Oral Q4H PRN   ??? ibuprofen (MOTRIN) tablet 400 mg  400 mg Oral Q8H PRN   ??? magnesium hydroxide (MILK OF MAGNESIA) oral suspension 30 mL  30 mL Oral DAILY PRN   ??? nicotine (NICODERM CQ) 21 mg/24 hr patch 1 Patch  1 Patch TransDERmal DAILY PRN   ??? LORazepam (ATIVAN) tablet 2 mg  2 mg Oral Q1H PRN   ??? LORazepam (ATIVAN) tablet 4 mg  4 mg Oral Q1H PRN   ??? metoclopramide HCl (REGLAN) injection 10 mg  10 mg IntraMUSCular Q6H PRN   ??? metoclopramide HCl (REGLAN) tablet 10 mg  10 mg Oral Q6H PRN   ??? folic  acid (FOLVITE) tablet 1 mg  1 mg Oral DAILY   ??? thiamine (B-1) tablet 100 mg  100 mg Oral DAILY   ??? [COMPLETED] LORazepam (ATIVAN) tablet 2 mg  2 mg Oral Q1H   ??? FLUoxetine (PROZAC) capsule 20 mg  20 mg Oral DAILY   ??? QUEtiapine (SEROQUEL) tablet 100 mg  100 mg Oral QHS   ??? albuterol (PROVENTIL,VENTOLIN)90 mcg inhaler  2 Puff Inhalation Q4H PRN       Scheduled Medications:  Current Facility-Administered Medications   Medication Dose Route Frequency   ??? pantoprazole (PROTONIX) tablet 40 mg  40 mg Oral ACB   ??? aspirin chewable tablet 81 mg  81 mg Oral DAILY   ??? multivitamin, tx-iron-ca-min (THERA-M w/ IRON) tablet 1 Tab  1 Tab Oral DAILY   ??? folic acid (FOLVITE) tablet 1 mg  1 mg Oral DAILY   ??? thiamine (B-1) tablet 100 mg  100 mg Oral DAILY   ??? [COMPLETED] LORazepam (ATIVAN) tablet 2 mg  2 mg Oral Q1H   ??? FLUoxetine (PROZAC) capsule 20 mg  20 mg Oral DAILY   ??? 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)    Assessment: No gross  psychosis    Plan: continue current medication regimen   Alcohol dependence (12/05/2009)    Assessment: no active AWS    Plan: Continue CIWA protocol   Non-compliance with treatment (12/05/2009)    Assessment: Improved    Plan: Continue to monitor   Suicidal behavior (06/24/2013)    Assessment: denies    Plan: Continue to monitor   Tobacco dependence (12/05/2009)    Assessment: no withdrawal    Plan: May offer nicotine patch        A coordinated, multidisplinary treatment team round was conducted with the patient; that include the nurse, unit pharmcist, Administrator all present.     The following regarding medications was addressed during rounds with patient:   the risks and benefits of the proposed medication. The patient was given the opportunity to ask questions. Informed consent given to the use of the above medications. Will continue to adjust psychiatric and non-psychiatric medications (see above "medication" section and orders section for details) as deemed appropriate & based upon diagnoses and response to treatment.     Will continue to order blood tests/labs and diagnostic tests as deemed appropriate and review results as they become available (see orders for details and above listed lab/test results).    Will order psychiatric records from previous psych hospitals to further elucidate the nature of patient's psychopathology and review once available.    Will gather additional collateral information from  friends, family and o/p treatment team to further elucidate the nature of patient's psychopathology and baselline level of psychiatric functioning.      Complete current electronic health record for patient was reviewed including consultant notes, ancillary staff notes, nurses and psychiatric tech notes    Will continue to provide individual, milieu, occupational, group, and substance abuse therapies to address target symptoms as deemed appropriate for the individual patient.      EXPECTED  DISCHARGE DATE (Day): TBD    DISPOSITION: Home    Signed By: Adela Lank, MD  06/25/2013

## 2013-06-26 MED ADMIN — FLUoxetine (PROZAC) capsule 20 mg: ORAL | @ 13:00:00 | NDC 00904578561

## 2013-06-26 MED ADMIN — pantoprazole (PROTONIX) tablet 40 mg: ORAL | @ 13:00:00 | NDC 51079005101

## 2013-06-26 MED ADMIN — aspirin chewable tablet 81 mg: ORAL | @ 13:00:00 | NDC 63739043401

## 2013-06-26 MED ADMIN — multivitamin, tx-iron-ca-min (THERA-M w/ IRON) tablet 1 Tab: ORAL | @ 13:00:00 | NDC 96295012867

## 2013-06-26 MED ADMIN — folic acid (FOLVITE) tablet 1 mg: ORAL | @ 13:00:00 | NDC 63739053710

## 2013-06-26 MED ADMIN — QUEtiapine (SEROQUEL) tablet 100 mg: ORAL | @ 02:00:00 | NDC 68084053211

## 2013-06-26 MED ADMIN — thiamine (B-1) tablet 100 mg: ORAL | @ 13:00:00 | NDC 90011015050

## 2013-06-26 NOTE — Behavioral Health Treatment Team (Signed)
Eugene Ferguson who was absent from Chemical Dependency group.    JAMES E CROSS  06/26/2013  10:19 AM

## 2013-06-26 NOTE — Behavioral Health Treatment Team (Signed)
Client quiet  low key with limited interaction due to isolative behavior affect flat  Thoughts processes  and concern guard  Clt.spending much of tour resting no verbalize discomfort or issues of concern voice tolerated meals and medication no problem. Clt. Encourage to vent concern asto needs and treatment goals and participation. Will continue Q66min. Safety monitoring

## 2013-06-26 NOTE — Behavioral Health Treatment Team (Signed)
Psychiatric Progress Note      Date: 06/26/2013  Account Number:  [id number]  Name: Eugene Ferguson      PSYCHOTHERAPY SESSION:  Length of psychotherapy session: 10 minutes  Interpersonal relationship issues and psychodynamic conflicts explored.  Supportive psychotherapy provided in regards to various ongoing psychosocial stressors (including some of the following):   pre-admission and current problems   Housing issues   Occupational issues   Academic issues   Legal issues   Medical issues   Interpersonal conflicts   Stress of hospitalization              Worked on issues of denial & effects of substance dependency/use  Cognitive/Behavioral therapy provided  Reality-Oriented psychotherapy provided              E & M PROGRESS NOTE:         SUBJECTIVE:   CC: "I am doing better"    HPI/Interval History: (reviewed/updated 06/26/2013)  Waldon reports/evidences the following emotional symptoms:  depression, anxiety, psychosis and alcohol dependence.  The symptoms have been present for years and are of moderate severity. The symptoms are constant / intermittent/  in nature.  Additional symptoms include poor concentration and likely precipitated by , treatment noncompliance and psychosocial stressors.  Patient is doing better. He is cooperative and pleasant. Minimal withdrawal. Improved hygiene. No gross psychosis or mania. Denies any SI. Tolerating medications well.      Review of Systems:  (reviewed/updated 06/26/2013)  Appetite:good   Sleep: good   All other Review of Systems: - General ROS: positive for  - poor hygiene  Psychological ROS: positive for - anxiety and depression  Respiratory ROS: no cough, shortness of breath, or wheezing  Cardiovascular ROS: no chest pain or dyspnea on exertion  Gastrointestinal ROS: no abdominal pain, change in bowel habits, or black or bloody stools  Neurological ROS: positive for - confusion    Side Effects: (reviewed/updated 06/26/2013)  None reported or admitted to.      Past Medical  History: (reviewed/updated 06/26/2013)  Active Ambulatory Problems     Diagnosis Date Noted   ??? Alcohol dependence 12/05/2009   ??? Tobacco dependence 12/05/2009   ??? Non-compliance with treatment 12/05/2009     Resolved Ambulatory Problems     Diagnosis Date Noted   ??? 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 Liver disease    ??? Other ill-defined conditions    ??? Other ill-defined conditions    ??? Asthma    ??? GERD (gastroesophageal reflux disease)    ??? Depression    ??? Psychotic disorder    ??? Substance abuse    ??? Suicidal thoughts      Past medical history has been reviewed (see dictated report) with no additional updates (I asked patient and no additional past medical history provided).    Social History: (reviewed/updated 06/26/2013)   reports that he has been smoking.  He has never used smokeless tobacco. He reports that  drinks alcohol. He reports that he does not use illicit drugs.  History     Social History Narrative   ??? No narrative on file     Social history has been reviewed (see dictated report) with no additional updates (I asked patient and no additional social history provided).    Family History: (reviewed/updated 06/26/2013)  Family history has been reviewed (see dictated report) with no additional updates (I asked patient and  no additional family history provided).    OBJECTIVE:                 MENTAL STATUS EXAM:  (reviewed/updated 06/26/2013 with changes noted below)  FINDINGS WITHIN NORMAL LIMITS (WNL) UNLESS OTHERWISE STATED BELOW:    Orientation oriented to time, place and person   Vital Signs (BP,Pulse, Temp) See below (reviewed)   Gait and Station Within normal limits   Abnormal Muscular Movements/Tone/Behavior No EPS, no Tardive Dyskinesia, no abnormal muscular movements; wnl tone   Relations cooperative and passive   General Appearance:  age appropriate and casually dressed   Language No aphasia or dysarthria    Speech:  normal pitch and normal volume   Thought Processes logical, wnl rate of thoughts, good abstract reasoning and computation   Thought Associations goal directed and logical   Thought Content free of delusions and free of hallucinations   Suicidal Ideations none   Homicidal Ideations none   Mood:  anxious and depressed   Affect:  constricted   Memory recent  impaired   Memory remote:  adequate   Concentration/Attention:  adequate   Fund of Knowledge Fair/average   Insight:  poor   Reliability poor   Judgment:  poor     Pertinent data/vitals:  Patient Vitals for the past 24 hrs:   Temp Pulse Resp BP   06/26/13 0600 96.3 ??F (35.7 ??C) 55 20 148/84 mmHg   06/25/13 1600 96.6 ??F (35.9 ??C) 86 20 109/82 mmHg   06/25/13 1437 - 66 16 154/87 mmHg     Admission on 06/23/2013   Component Date Value   ??? Protein, total 06/25/2013 7.3    ??? Albumin 06/25/2013 3.3*   ??? Globulin 06/25/2013 4.0    ??? A-G Ratio 06/25/2013 0.8*   ??? Bilirubin, total 06/25/2013 0.5    ??? Bilirubin, direct 06/25/2013 0.1    ??? Alk. phosphatase 06/25/2013 74    ??? AST 06/25/2013 54*   ??? ALT 06/25/2013 57        XR Results (most recent):    Results from Hospital Encounter encounter on 12/04/10   XR ANKLE LEFT  AP LAT   Narrative **Final Report**      ICD Codes / Adm.Diagnosis: V70.8   / LEFT DISTAL FIBULAR FRACTURE    Examination:  CR ANKLE 2 VWS LT  - [id number] - Dec 05 2010 11:14AM  Accession No:  [id number]  Reason:  surgery      REPORT:  CLINICAL HISTORY: ORIF.    AP and lateral views of the left ankle demonstrate ORIF of the distal   femoral fracture with improved alignment.       IMPRESSION: ORIF.          Signing/Reading Doctor: Gordan Payment [phone])    Approved: Gordan Payment [phone])  12/05/2010                                      Allergies:  No Known Allergies    Medications:  Current Facility-Administered Medications   Medication Dose Route Frequency   ??? pantoprazole (PROTONIX) tablet 40 mg  40 mg Oral ACB   ??? aspirin chewable tablet 81 mg   81 mg Oral DAILY   ??? multivitamin, tx-iron-ca-min (THERA-M w/ IRON) tablet 1 Tab  1 Tab Oral DAILY   ??? ziprasidone (GEODON) 20 mg in sterile water (preservative free) injection  20  mg IntraMUSCular BID PRN   ??? OLANZapine (ZYPREXA) tablet 5 mg  5 mg Oral Q6H PRN   ??? benztropine (COGENTIN) tablet 2 mg  2 mg Oral BID PRN   ??? benztropine (COGENTIN) injection 2 mg  2 mg IntraMUSCular Q12H PRN   ??? LORazepam (ATIVAN) injection 2 mg  2 mg IntraMUSCular Q4H PRN   ??? LORazepam (ATIVAN) tablet 1 mg  1 mg Oral Q4H PRN   ??? zolpidem (AMBIEN) tablet 10 mg  10 mg Oral QHS PRN   ??? acetaminophen (TYLENOL) tablet 650 mg  650 mg Oral Q4H PRN   ??? ibuprofen (MOTRIN) tablet 400 mg  400 mg Oral Q8H PRN   ??? magnesium hydroxide (MILK OF MAGNESIA) oral suspension 30 mL  30 mL Oral DAILY PRN   ??? nicotine (NICODERM CQ) 21 mg/24 hr patch 1 Patch  1 Patch TransDERmal DAILY PRN   ??? LORazepam (ATIVAN) tablet 2 mg  2 mg Oral Q1H PRN   ??? LORazepam (ATIVAN) tablet 4 mg  4 mg Oral Q1H PRN   ??? metoclopramide HCl (REGLAN) injection 10 mg  10 mg IntraMUSCular Q6H PRN   ??? metoclopramide HCl (REGLAN) tablet 10 mg  10 mg Oral Q6H PRN   ??? folic acid (FOLVITE) tablet 1 mg  1 mg Oral DAILY   ??? thiamine (B-1) tablet 100 mg  100 mg Oral DAILY   ??? FLUoxetine (PROZAC) capsule 20 mg  20 mg Oral DAILY   ??? QUEtiapine (SEROQUEL) tablet 100 mg  100 mg Oral QHS   ??? albuterol (PROVENTIL,VENTOLIN)90 mcg inhaler  2 Puff Inhalation Q4H PRN       Scheduled Medications:  Current Facility-Administered Medications   Medication Dose Route Frequency   ??? pantoprazole (PROTONIX) tablet 40 mg  40 mg Oral ACB   ??? aspirin chewable tablet 81 mg  81 mg Oral DAILY   ??? multivitamin, tx-iron-ca-min (THERA-M w/ IRON) tablet 1 Tab  1 Tab Oral DAILY   ??? folic acid (FOLVITE) tablet 1 mg  1 mg Oral DAILY   ??? thiamine (B-1) tablet 100 mg  100 mg Oral DAILY   ??? FLUoxetine (PROZAC) capsule 20 mg  20 mg Oral DAILY   ??? 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)    Assessment: No gross psychosis    Plan: continue current medication regimen   Alcohol dependence (12/05/2009)    Assessment: no active AWS    Plan: Continue CIWA protocol   Non-compliance with treatment (12/05/2009)    Assessment: Improved    Plan: Continue to monitor   Suicidal behavior (06/24/2013)    Assessment: denies    Plan: Continue to monitor   Tobacco dependence (12/05/2009)    Assessment: no withdrawal    Plan: May offer nicotine patch        A coordinated, multidisplinary treatment team round was conducted with the patient; that include the nurse, unit pharmcist, Administrator all present.     The following regarding medications was addressed during rounds with patient:   the risks and benefits of the proposed medication. The patient was given the opportunity to ask questions. Informed consent given to the use of the above medications. Will continue to adjust psychiatric and non-psychiatric medications (see  above "medication" section and orders section for details) as deemed appropriate & based upon diagnoses and response to treatment.     Will continue to order blood tests/labs and diagnostic tests as deemed appropriate and review results as they become available (see orders for details and above listed lab/test results).    Will order psychiatric records from previous psych hospitals to further elucidate the nature of patient's psychopathology and review once available.    Will gather additional collateral information from  friends, family and o/p treatment team to further elucidate the nature of patient's psychopathology and baselline level of psychiatric functioning.      Complete current electronic health record for patient was reviewed including consultant notes, ancillary staff notes, nurses and  psychiatric tech notes    Will continue to provide individual, milieu, occupational, group, and substance abuse therapies to address target symptoms as deemed appropriate for the individual patient.      EXPECTED DISCHARGE DATE (Day): 1-2 days    DISPOSITION: shelter or [facility]    Signed By: Adela Lank, MD  06/26/2013

## 2013-06-26 NOTE — Behavioral Health Treatment Team (Signed)
Pt didn't attend goals group

## 2013-06-26 NOTE — Behavioral Health Treatment Team (Signed)
BEHAVIORAL HEALTH PROGRESS NOTE        Pt rested quietly in bed with eyes closed for 7 hours.  No signs/symptoms of distress, agitation, or anxiety.  Will monitor for changes.  Q 15 minute checks continue.

## 2013-06-26 NOTE — Behavioral Health Treatment Team (Signed)
Behavior  The patient Eugene Ferguson is alert and oriented.?? His hygiene and nutritional intake  is adequate.??This patient  behavior has remains  appropriate in the milieu with peers and staff, but presents isolative.??Pt contracts for safety??Pt medication  compliant. Flat affect.??Pt mood pleasant upon approach.?? ??   Intervention  1:1 interaction to assess mood and thought process.??Staff offering assistance as needed.??  Response  Pt denies S/H ideations. Pt denies  hearing voices .Pt absent from groups.   Plan  Staff plan is to assess mood and  thought process??.Staff will monitor for changes.??Q 15 minute checks continue.

## 2013-06-26 NOTE — Behavioral Health Treatment Team (Cosign Needed)
GROUP THERAPY PROGRESS NOTE    Eugene Ferguson is participating in Evening Reflections Group.     Group time: 15 mins    Personal goal for participation: Reflecting on the day's events    Goal orientation: personal    Group therapy participation: active    Therapeutic interventions reviewed and discussed: Importance of medication compliance during treatment and after discharge; encouragement provided for attending treatment team and presenting appropriately    Impression of participation: Attentive; minimal verbal participation when prompted.

## 2013-06-27 MED ADMIN — folic acid (FOLVITE) tablet 1 mg: ORAL | @ 14:00:00 | NDC 63739053710

## 2013-06-27 MED ADMIN — QUEtiapine (SEROQUEL) tablet 100 mg: ORAL | @ 02:00:00 | NDC 68084053211

## 2013-06-27 MED ADMIN — aspirin chewable tablet 81 mg: ORAL | @ 14:00:00 | NDC 63739043401

## 2013-06-27 MED ADMIN — thiamine (B-1) tablet 100 mg: ORAL | @ 14:00:00 | NDC 90011015050

## 2013-06-27 MED ADMIN — FLUoxetine (PROZAC) capsule 20 mg: ORAL | @ 14:00:00 | NDC 00904578561

## 2013-06-27 MED ADMIN — multivitamin, tx-iron-ca-min (THERA-M w/ IRON) tablet 1 Tab: ORAL | @ 14:00:00 | NDC 96295012867

## 2013-06-27 MED ADMIN — pantoprazole (PROTONIX) tablet 40 mg: ORAL | @ 10:00:00 | NDC 51079005101

## 2013-06-27 NOTE — Behavioral Health Treatment Team (Signed)
Pt rested in bed with eyes closed for approximately 7 hours. No reports made by pt of having any difficulty sleeping while staff conducted rounds. Respirations even and unlabored. No acute distress noted. Was maintained on q15min checks. Will continue to monitor.

## 2013-06-27 NOTE — Behavioral Health Treatment Team (Signed)
Psychiatric Progress Note      Date: 06/27/2013  Account Number:  [id number]  Name: Eugene Ferguson      PSYCHOTHERAPY SESSION:  Length of psychotherapy session: 20 minutes  Interpersonal relationship issues and psychodynamic conflicts explored.  Supportive psychotherapy provided in regards to various ongoing psychosocial stressors (including some of the following):   pre-admission and current problems   Housing issues   Occupational issues   Academic issues   Legal issues   Medical issues   Interpersonal conflicts   Stress of hospitalization              Worked on issues of denial & effects of substance dependency/use  Cognitive/Behavioral therapy provided  Reality-Oriented psychotherapy provided              E & M PROGRESS NOTE:         SUBJECTIVE:   CC: "I don't know how to find housing"    HPI/Interval History: (reviewed/updated 06/27/2013)  Eugene Ferguson reports/evidences the following emotional symptoms:  depression, anxiety, psychosis and alcohol dependence.  The symptoms have been present for years and are of moderate severity. The symptoms are constant / intermittent/  in nature.  Additional symptoms include poor concentration and likely precipitated by , treatment noncompliance and psychosocial stressors.  Patient is doing better. He is cooperative and pleasant. Minimal withdrawal. Improved hygiene. No gross psychosis or mania. Denies any SI. Tolerating medications well.   He was willing to work with Eugene Ferguson to find a place to live.      Review of Systems:  (reviewed/updated 06/27/2013)  Appetite:good   Sleep: good   All other Review of Systems: - General ROS: positive for  - poor hygiene  Psychological ROS: positive for - anxiety and depression  Respiratory ROS: no cough, shortness of breath, or wheezing  Cardiovascular ROS: no chest pain or dyspnea on exertion  Gastrointestinal ROS: no abdominal pain, change in bowel habits, or black or bloody stools  Neurological ROS: positive for - confusion    Side Effects:  (reviewed/updated 06/27/2013)  None reported or admitted to.      Past Medical History: (reviewed/updated 06/27/2013)  Active Ambulatory Problems     Diagnosis Date Noted   ??? Alcohol dependence 12/05/2009   ??? Tobacco dependence 12/05/2009   ??? Non-compliance with treatment 12/05/2009     Resolved Ambulatory Problems     Diagnosis Date Noted   ??? 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 Liver disease    ??? Other ill-defined conditions    ??? Other ill-defined conditions    ??? Asthma    ??? GERD (gastroesophageal reflux disease)    ??? Depression    ??? Psychotic disorder    ??? Substance abuse    ??? Suicidal thoughts      Past medical history has been reviewed (see dictated report) with no additional updates (I asked patient and no additional past medical history provided).    Social History: (reviewed/updated 06/27/2013)   reports that he has been smoking.  He has never used smokeless tobacco. He reports that  drinks alcohol. He reports that he does not use illicit drugs.  History     Social History Narrative   ??? No narrative on file     Social history has been reviewed (see dictated report) with no additional updates (I asked patient and no additional social history provided).    Family History: (reviewed/updated  06/27/2013)  Family history has been reviewed (see dictated report) with no additional updates (I asked patient and no additional family history provided).    OBJECTIVE:                 MENTAL STATUS EXAM:  (reviewed/updated 06/27/2013 with changes noted below)  FINDINGS WITHIN NORMAL LIMITS (WNL) UNLESS OTHERWISE STATED BELOW:    Orientation oriented to time, place and person   Vital Signs (BP,Pulse, Temp) See below (reviewed)   Gait and Station Within normal limits   Abnormal Muscular Movements/Tone/Behavior No EPS, no Tardive Dyskinesia, no abnormal muscular movements; wnl tone   Relations cooperative and passive   General Appearance:   age appropriate and casually dressed   Language No aphasia or dysarthria   Speech:  normal pitch and normal volume   Thought Processes logical, wnl rate of thoughts, good abstract reasoning and computation   Thought Associations goal directed and logical   Thought Content free of delusions and free of hallucinations   Suicidal Ideations none   Homicidal Ideations none   Mood:  anxious and depressed   Affect:  constricted   Memory recent  impaired   Memory remote:  adequate   Concentration/Attention:  adequate   Fund of Knowledge Fair/average   Insight:  poor   Reliability poor   Judgment:  poor     Pertinent data/vitals:  Patient Vitals for the past 24 hrs:   Temp Pulse Resp BP   06/27/13 0600 95.7 ??F (35.4 ??C) 69 20 99/77 mmHg     Admission on 06/23/2013   Component Date Value   ??? Protein, total 06/25/2013 7.3    ??? Albumin 06/25/2013 3.3*   ??? Globulin 06/25/2013 4.0    ??? A-G Ratio 06/25/2013 0.8*   ??? Bilirubin, total 06/25/2013 0.5    ??? Bilirubin, direct 06/25/2013 0.1    ??? Alk. phosphatase 06/25/2013 74    ??? AST 06/25/2013 54*   ??? ALT 06/25/2013 57        XR Results (most recent):    Results from Hospital Encounter encounter on 12/04/10   XR ANKLE LEFT  AP LAT   Narrative **Final Report**      ICD Codes / Adm.Diagnosis: V70.8   / LEFT DISTAL FIBULAR FRACTURE    Examination:  CR ANKLE 2 VWS LT  - [id number] - Dec 05 2010 11:14AM  Accession No:  [id number]  Reason:  surgery      REPORT:  CLINICAL HISTORY: ORIF.    AP and lateral views of the left ankle demonstrate ORIF of the distal   femoral fracture with improved alignment.       IMPRESSION: ORIF.          Signing/Reading Doctor: Gordan Payment [phone])    Approved: Gordan Payment [phone])  12/05/2010                                      Allergies:  No Known Allergies    Medications:  Current Facility-Administered Medications   Medication Dose Route Frequency   ??? pantoprazole (PROTONIX) tablet 40 mg  40 mg Oral ACB   ??? aspirin chewable tablet 81 mg  81 mg Oral  DAILY   ??? multivitamin, tx-iron-ca-min (THERA-M w/ IRON) tablet 1 Tab  1 Tab Oral DAILY   ??? ziprasidone (GEODON) 20 mg in sterile water (preservative free) injection  20 mg IntraMUSCular BID  PRN   ??? OLANZapine (ZYPREXA) tablet 5 mg  5 mg Oral Q6H PRN   ??? benztropine (COGENTIN) tablet 2 mg  2 mg Oral BID PRN   ??? benztropine (COGENTIN) injection 2 mg  2 mg IntraMUSCular Q12H PRN   ??? LORazepam (ATIVAN) injection 2 mg  2 mg IntraMUSCular Q4H PRN   ??? LORazepam (ATIVAN) tablet 1 mg  1 mg Oral Q4H PRN   ??? zolpidem (AMBIEN) tablet 10 mg  10 mg Oral QHS PRN   ??? acetaminophen (TYLENOL) tablet 650 mg  650 mg Oral Q4H PRN   ??? ibuprofen (MOTRIN) tablet 400 mg  400 mg Oral Q8H PRN   ??? magnesium hydroxide (MILK OF MAGNESIA) oral suspension 30 mL  30 mL Oral DAILY PRN   ??? nicotine (NICODERM CQ) 21 mg/24 hr patch 1 Patch  1 Patch TransDERmal DAILY PRN   ??? LORazepam (ATIVAN) tablet 2 mg  2 mg Oral Q1H PRN   ??? LORazepam (ATIVAN) tablet 4 mg  4 mg Oral Q1H PRN   ??? metoclopramide HCl (REGLAN) injection 10 mg  10 mg IntraMUSCular Q6H PRN   ??? metoclopramide HCl (REGLAN) tablet 10 mg  10 mg Oral Q6H PRN   ??? folic acid (FOLVITE) tablet 1 mg  1 mg Oral DAILY   ??? thiamine (B-1) tablet 100 mg  100 mg Oral DAILY   ??? FLUoxetine (PROZAC) capsule 20 mg  20 mg Oral DAILY   ??? QUEtiapine (SEROQUEL) tablet 100 mg  100 mg Oral QHS   ??? albuterol (PROVENTIL,VENTOLIN)90 mcg inhaler  2 Puff Inhalation Q4H PRN       Scheduled Medications:  Current Facility-Administered Medications   Medication Dose Route Frequency   ??? pantoprazole (PROTONIX) tablet 40 mg  40 mg Oral ACB   ??? aspirin chewable tablet 81 mg  81 mg Oral DAILY   ??? multivitamin, tx-iron-ca-min (THERA-M w/ IRON) tablet 1 Tab  1 Tab Oral DAILY   ??? folic acid (FOLVITE) tablet 1 mg  1 mg Oral DAILY   ??? thiamine (B-1) tablet 100 mg  100 mg Oral DAILY   ??? FLUoxetine (PROZAC) capsule 20 mg  20 mg Oral DAILY   ??? 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)    Assessment: No gross psychosis    Plan: continue current medication regimen   Alcohol dependence (12/05/2009)    Assessment: no active AWS    Plan: Continue CIWA protocol   Non-compliance with treatment (12/05/2009)    Assessment: Improved    Plan: Continue to monitor   Suicidal behavior (06/24/2013)    Assessment: denies    Plan: Continue to monitor   Tobacco dependence (12/05/2009)    Assessment: no withdrawal    Plan: May offer nicotine patch        A coordinated, multidisplinary treatment team round was conducted with the patient; that include the nurse, unit pharmcist, Administrator all present.     The following regarding medications was addressed during rounds with patient:   the risks and benefits of the proposed medication. The patient was given the opportunity to ask questions. Informed consent given to the use of the above medications. Will continue to adjust psychiatric and non-psychiatric medications (see above "medication" section  and orders section for details) as deemed appropriate & based upon diagnoses and response to treatment.     Will continue to order blood tests/labs and diagnostic tests as deemed appropriate and review results as they become available (see orders for details and above listed lab/test results).    Will order psychiatric records from previous psych hospitals to further elucidate the nature of patient's psychopathology and review once available.    Will gather additional collateral information from  friends, family and o/p treatment team to further elucidate the nature of patient's psychopathology and baselline level of psychiatric functioning.      Complete current electronic health record for patient was reviewed including consultant notes, ancillary staff notes, nurses and psychiatric tech  notes    Will continue to provide individual, milieu, occupational, group, and substance abuse therapies to address target symptoms as deemed appropriate for the individual patient.      EXPECTED DISCHARGE DATE (Day): 1-2 days    DISPOSITION: Team SW to get [facility] to evaluate him for services before d/c.   If no housing, he will be d/c to a shelter  Psych FU with Dr. Leilani Merl    Signed By: Adela Lank, MD  06/27/2013

## 2013-06-27 NOTE — Behavioral Health Treatment Team (Incomplete)
Client quiet low key in actives and social interaction affect flat mood pleasant and cooperate thought processes guard in brief 1-1 on treatment issues,goals or concern.

## 2013-06-27 NOTE — Behavioral Health Treatment Team (Cosign Needed)
GROUP THERAPY PROGRESS NOTE    The patient Eugene Ferguson a 57 y.o. male is participating in Coping Skills Group.     Group time: 45 minutes    Personal goal for participation: To participate in relaxation activity    Goal orientation:  relaxation    Group therapy participation: active    Therapeutic interventions reviewed and discussed: yes    Impression of participation:  The patient was attentive.    BEVERLY S BAKER  06/27/2013  12:25 PM

## 2013-06-27 NOTE — Behavioral Health Treatment Team (Signed)
Pt didn't attend goals group.

## 2013-06-28 MED ADMIN — FLUoxetine (PROZAC) capsule 20 mg: ORAL | @ 13:00:00 | NDC 00904578561

## 2013-06-28 MED ADMIN — aspirin chewable tablet 81 mg: ORAL | @ 13:00:00 | NDC 63739043401

## 2013-06-28 MED ADMIN — QUEtiapine (SEROQUEL) tablet 100 mg: ORAL | @ 01:00:00 | NDC 68084053211

## 2013-06-28 MED ADMIN — folic acid (FOLVITE) tablet 1 mg: ORAL | @ 13:00:00 | NDC 63739053710

## 2013-06-28 MED ADMIN — multivitamin, tx-iron-ca-min (THERA-M w/ IRON) tablet 1 Tab: ORAL | @ 13:00:00 | NDC 96295012867

## 2013-06-28 MED ADMIN — thiamine (B-1) tablet 100 mg: ORAL | @ 13:00:00 | NDC 90011015050

## 2013-06-28 MED ADMIN — pantoprazole (PROTONIX) tablet 40 mg: ORAL | @ 10:00:00 | NDC 51079005101

## 2013-06-28 NOTE — Behavioral Health Treatment Team (Cosign Needed)
REFLECTIONS GROUP THERAPY PROGRESS NOTE    The patient Eugene Ferguson is participating in Reflections Group Therapy.     Group time: 30 minutes    Personal goal for participation: Share with group about feelings and concerns throughout the course of the day.    Goal orientation: personal    Group therapy participation: active    Therapeutic interventions reviewed and discussed: Yes    Impression of participation:   Positive input.    Ralph Dowdy  06/28/2013

## 2013-06-28 NOTE — Behavioral Health Treatment Team (Signed)
Psychiatric Progress Note      Date: 06/28/2013  Account Number:  [id number]  Name: Eugene Ferguson      PSYCHOTHERAPY SESSION:  Length of psychotherapy session: 20 minutes  Interpersonal relationship issues and psychodynamic conflicts explored.  Supportive psychotherapy provided in regards to various ongoing psychosocial stressors (including some of the following):   pre-admission and current problems   Housing issues   Occupational issues   Academic issues   Legal issues   Medical issues   Interpersonal conflicts   Stress of hospitalization              Worked on issues of denial & effects of substance dependency/use  Cognitive/Behavioral therapy provided  Reality-Oriented psychotherapy provided              E & M PROGRESS NOTE:         SUBJECTIVE:   CC: "I am feeling much better, I am not alcoholic"    HPI/Interval History: (reviewed/updated 06/28/2013)  Eugene Ferguson reports/evidences the following emotional symptoms:  depression, anxiety, psychosis and alcohol dependence.  The symptoms have been present for years and are of moderate severity. The symptoms are constant / intermittent/  in nature.  Additional symptoms include poor concentration and likely precipitated by , treatment noncompliance and psychosocial stressors.  Patient is doing better. He is cooperative and pleasant. He is logical and linear. Agrees to try new group home. He is not interested in alcohol rehab program.     Review of Systems:  (reviewed/updated 06/28/2013)  Appetite:good   Sleep: good   All other Review of Systems: - General ROS: positive for  - poor hygiene  Psychological ROS: positive for - anxiety and depression  Respiratory ROS: no cough, shortness of breath, or wheezing  Cardiovascular ROS: no chest pain or dyspnea on exertion  Gastrointestinal ROS: no abdominal pain, change in bowel habits, or black or bloody stools  Neurological ROS: positive for - confusion    Side Effects: (reviewed/updated 06/28/2013)  None reported or admitted to.       Past Medical History: (reviewed/updated 06/28/2013)  Active Ambulatory Problems     Diagnosis Date Noted   ??? Alcohol dependence 12/05/2009   ??? Tobacco dependence 12/05/2009   ??? Non-compliance with treatment 12/05/2009     Resolved Ambulatory Problems     Diagnosis Date Noted   ??? 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 Liver disease    ??? Other ill-defined conditions    ??? Other ill-defined conditions    ??? Asthma    ??? GERD (gastroesophageal reflux disease)    ??? Depression    ??? Psychotic disorder    ??? Substance abuse    ??? Suicidal thoughts      Past medical history has been reviewed (see dictated report) with no additional updates (I asked patient and no additional past medical history provided).    Social History: (reviewed/updated 06/28/2013)   reports that he has been smoking.  He has never used smokeless tobacco. He reports that  drinks alcohol. He reports that he does not use illicit drugs.  History     Social History Narrative   ??? No narrative on file     Social history has been reviewed (see dictated report) with no additional updates (I asked patient and no additional social history provided).    Family History: (reviewed/updated 06/28/2013)  Family history has been reviewed (see dictated report)  with no additional updates (I asked patient and no additional family history provided).    OBJECTIVE:                 MENTAL STATUS EXAM:  (reviewed/updated 06/28/2013 with changes noted below)  FINDINGS WITHIN NORMAL LIMITS (WNL) UNLESS OTHERWISE STATED BELOW:    Orientation oriented to time, place and person   Vital Signs (BP,Pulse, Temp) See below (reviewed)   Gait and Station Within normal limits   Abnormal Muscular Movements/Tone/Behavior No EPS, no Tardive Dyskinesia, no abnormal muscular movements; wnl tone   Relations cooperative and passive   General Appearance:  age appropriate and casually dressed   Language No aphasia or  dysarthria   Speech:  normal pitch and normal volume   Thought Processes logical, wnl rate of thoughts, good abstract reasoning and computation   Thought Associations goal directed and logical   Thought Content free of delusions and free of hallucinations   Suicidal Ideations none   Homicidal Ideations none   Mood:  anxious and depressed   Affect:  constricted   Memory recent  impaired   Memory remote:  adequate   Concentration/Attention:  adequate   Fund of Knowledge Fair/average   Insight:  poor   Reliability poor   Judgment:  poor     Pertinent data/vitals:  Patient Vitals for the past 24 hrs:   Temp Pulse Resp BP   06/28/13 0600 96 ??F (35.6 ??C) 64 16 119/86 mmHg     Admission on 06/23/2013   Component Date Value   ??? Protein, total 06/25/2013 7.3    ??? Albumin 06/25/2013 3.3*   ??? Globulin 06/25/2013 4.0    ??? A-G Ratio 06/25/2013 0.8*   ??? Bilirubin, total 06/25/2013 0.5    ??? Bilirubin, direct 06/25/2013 0.1    ??? Alk. phosphatase 06/25/2013 74    ??? AST 06/25/2013 54*   ??? ALT 06/25/2013 57        XR Results (most recent):    Results from Hospital Encounter encounter on 12/04/10   XR ANKLE LEFT  AP LAT   Narrative **Final Report**      ICD Codes / Adm.Diagnosis: V70.8   / LEFT DISTAL FIBULAR FRACTURE    Examination:  CR ANKLE 2 VWS LT  - [id number] - Dec 05 2010 11:14AM  Accession No:  [id number]  Reason:  surgery      REPORT:  CLINICAL HISTORY: ORIF.    AP and lateral views of the left ankle demonstrate ORIF of the distal   femoral fracture with improved alignment.       IMPRESSION: ORIF.          Signing/Reading Doctor: Gordan Payment [phone])    Approved: Gordan Payment [phone])  12/05/2010                                      Allergies:  No Known Allergies    Medications:  Current Facility-Administered Medications   Medication Dose Route Frequency   ??? pantoprazole (PROTONIX) tablet 40 mg  40 mg Oral ACB   ??? aspirin chewable tablet 81 mg  81 mg Oral DAILY   ??? multivitamin, tx-iron-ca-min (THERA-M w/ IRON) tablet 1  Tab  1 Tab Oral DAILY   ??? ziprasidone (GEODON) 20 mg in sterile water (preservative free) injection  20 mg IntraMUSCular BID PRN   ??? OLANZapine (ZYPREXA) tablet 5 mg  5 mg Oral Q6H PRN   ??? benztropine (COGENTIN) tablet 2 mg  2 mg Oral BID PRN   ??? benztropine (COGENTIN) injection 2 mg  2 mg IntraMUSCular Q12H PRN   ??? LORazepam (ATIVAN) injection 2 mg  2 mg IntraMUSCular Q4H PRN   ??? LORazepam (ATIVAN) tablet 1 mg  1 mg Oral Q4H PRN   ??? zolpidem (AMBIEN) tablet 10 mg  10 mg Oral QHS PRN   ??? acetaminophen (TYLENOL) tablet 650 mg  650 mg Oral Q4H PRN   ??? ibuprofen (MOTRIN) tablet 400 mg  400 mg Oral Q8H PRN   ??? magnesium hydroxide (MILK OF MAGNESIA) oral suspension 30 mL  30 mL Oral DAILY PRN   ??? nicotine (NICODERM CQ) 21 mg/24 hr patch 1 Patch  1 Patch TransDERmal DAILY PRN   ??? LORazepam (ATIVAN) tablet 2 mg  2 mg Oral Q1H PRN   ??? LORazepam (ATIVAN) tablet 4 mg  4 mg Oral Q1H PRN   ??? metoclopramide HCl (REGLAN) injection 10 mg  10 mg IntraMUSCular Q6H PRN   ??? metoclopramide HCl (REGLAN) tablet 10 mg  10 mg Oral Q6H PRN   ??? folic acid (FOLVITE) tablet 1 mg  1 mg Oral DAILY   ??? thiamine (B-1) tablet 100 mg  100 mg Oral DAILY   ??? FLUoxetine (PROZAC) capsule 20 mg  20 mg Oral DAILY   ??? QUEtiapine (SEROQUEL) tablet 100 mg  100 mg Oral QHS   ??? albuterol (PROVENTIL,VENTOLIN)90 mcg inhaler  2 Puff Inhalation Q4H PRN       Scheduled Medications:  Current Facility-Administered Medications   Medication Dose Route Frequency   ??? pantoprazole (PROTONIX) tablet 40 mg  40 mg Oral ACB   ??? aspirin chewable tablet 81 mg  81 mg Oral DAILY   ??? multivitamin, tx-iron-ca-min (THERA-M w/ IRON) tablet 1 Tab  1 Tab Oral DAILY   ??? folic acid (FOLVITE) tablet 1 mg  1 mg Oral DAILY   ??? thiamine (B-1) tablet 100 mg  100 mg Oral DAILY   ??? FLUoxetine (PROZAC) capsule 20 mg  20 mg Oral DAILY   ??? 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)    Assessment: No gross psychosis    Plan: continue current medication regimen   Alcohol dependence (12/05/2009)    Assessment: no active AWS    Plan: Continue CIWA protocol   Non-compliance with treatment (12/05/2009)    Assessment: Improved    Plan: Continue to monitor   Suicidal behavior (06/24/2013)    Assessment: denies    Plan: Continue to monitor   Tobacco dependence (12/05/2009)    Assessment: no withdrawal    Plan: Continue to monitor        A coordinated, multidisplinary treatment team round was conducted with the patient; that include the nurse, unit pharmcist, Administrator all present.     The following regarding medications was addressed during rounds with patient:   the risks and benefits of the proposed medication. The patient was given the opportunity to ask questions. Informed consent given to the use of the above medications. Will continue to adjust psychiatric and non-psychiatric medications (see above "medication" section and orders section for details) as deemed appropriate & based upon  diagnoses and response to treatment.     Will continue to order blood tests/labs and diagnostic tests as deemed appropriate and review results as they become available (see orders for details and above listed lab/test results).    Will order psychiatric records from previous psych hospitals to further elucidate the nature of patient's psychopathology and review once available.    Will gather additional collateral information from  friends, family and o/p treatment team to further elucidate the nature of patient's psychopathology and baselline level of psychiatric functioning.      Complete current electronic health record for patient was reviewed including consultant notes, ancillary staff notes, nurses and psychiatric tech notes    Will continue to provide individual, milieu, occupational, group,  and substance abuse therapies to address target symptoms as deemed appropriate for the individual patient.      EXPECTED DISCHARGE DATE (Day): 1-2 days    DISPOSITION: ? New group home  If no housing, he will be d/c to a shelter  Psych FU with Dr. Leilani Merl or [facility]    Signed By: Adela Lank, MD  06/28/2013

## 2013-06-28 NOTE — Progress Notes (Signed)
Recommendations: cont diet as tolerated.  Pt may benefit from cardiac meal plan.  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

## 2013-06-28 NOTE — Behavioral Health Treatment Team (Cosign Needed)
Pt did not attend the Goals Group.

## 2013-06-28 NOTE — Behavioral Health Treatment Team (Signed)
Social Work     Consulting civil engineer this date alerts SW that a spot is available at [facility] for which this pt may have potential placement.  RN identifies that a chest X-ray and other needed medical tests will be ordered for the pt in anticipation of this possible housing option on 8/13 at pt discharge.

## 2013-06-28 NOTE — Behavioral Health Treatment Team (Cosign Needed)
GROUP THERAPY PROGRESS NOTE    The patient Eugene Ferguson is participating in Community Group.    Group time: 30 minutes    Personal goal for participation: to orient the patient to the unit.    Goal orientation: successful adoption of unit rules    Group therapy participation: minimal    Therapeutic interventions reviewed and discussed: Yes    Impression of participation: minimal    JAMES E CROSS  06/28/2013 10:14 AM

## 2013-06-28 NOTE — Behavioral Health Treatment Team (Signed)
[place] [facility] returns the phone call made earlier today by SW. Call from:    [facility] - [phone].  This case worker-caller explains their services, which are restricted to:     Chronically homeless= homeless for a continuous year; or 4 episodes of homelessness across the previous 3 year period + a documentted mental or emotional disability.   If these conditions apply, Intake number with voicemail descriptions and definitions:  [phone]. Then, after all info left on that message Eugene Ferguson case manager will phone back in 7 business days.         Then, Veterans have 2 programs, Rapid Response which is esp quick turnaround of 2 or 3 weeks, or Prevention programs, which aid veterans [only] in making utilities or paying the rent when interruptions or special demands arise.    This SW then meets again with Eugene Ferguson. The pt explains he has been without housing "at least twice in the past 3 years" he says. Discussion next with the pt about potential placement at [facility], which the pt says will work for him.  Cognitive reframing is done with this pt around negative thinking, for pt to review his strengths, assets and discussion of coping skills in order that pt does not pick up a drink again.

## 2013-06-28 NOTE — Behavioral Health Treatment Team (Cosign Needed)
GROUP THERAPY PROGRESS NOTE    The patient Eugene Ferguson a 57 y.o. male is participating in Creative Expression Group.     Group time: 1 hour    Personal goal for participation: To concentrate on selected task    Goal orientation: social    Group therapy participation: active    Therapeutic interventions reviewed and discussed: Crafts, games, music    Impression of participation: The patient was attentive.    BEVERLY S BAKER  06/28/2013  4:43 PM

## 2013-06-28 NOTE — Behavioral Health Treatment Team (Signed)
nsg note  Depressed mood  Client has been out in the dayroom for a good part of the pm.  Does not attend many groups.  Has been working with the SW on placement.  Meal and med compliant, q 15 min checks for safety continue.

## 2013-06-28 NOTE — Behavioral Health Treatment Team (Signed)
Social Work     Eugene Ferguson is seen this date; the pt reports growing stronger mentally and physically with vast improvement since admission date. The pt denies any suicidal or homicidal ideation, intent or plan today and pt looking forward to what is ahead. Pt reports he receives $721 per mo., and can afford subsidized housing.  Asked if he is interested in going to a rehab, the pt eagerly responds:  "Yes! But, no not for alcohol! I don't have a problem with alcohol," the pt says. Tentative discharge plan discussed with Eugene Ferguson this date is for 06/29/13.    Call to [place] [facility] to invite their visit to pt for assessment at  [address] [place], [place] [address]  [phone] press '1' Vanessa Kick, Intake Coordinator with a specific voicemail left, requesting a return phone call today to arrange an assessment today on this unit by [employer] staff for potential placement or transfer of this pt to housing support.

## 2013-06-28 NOTE — Behavioral Health Treatment Team (Signed)
Pt rested in bed with eyes closed for approximately 7 hours. No reports made by pt of having any difficulty sleeping while staff conducted rounds. Respirations even and unlabored. No acute distress noted. Was maintained on q15min checks. Will continue to monitor.

## 2013-06-28 NOTE — Behavioral Health Treatment Team (Cosign Needed)
Pt did not attend coping skills group.

## 2013-06-29 MED ADMIN — FLUoxetine (PROZAC) capsule 20 mg: ORAL | @ 13:00:00 | NDC 00904578561

## 2013-06-29 MED ADMIN — aspirin chewable tablet 81 mg: ORAL | @ 13:00:00 | NDC 63739043401

## 2013-06-29 MED ADMIN — QUEtiapine (SEROQUEL) tablet 100 mg: ORAL | @ 01:00:00 | NDC 68084053211

## 2013-06-29 MED ADMIN — multivitamin, tx-iron-ca-min (THERA-M w/ IRON) tablet 1 Tab: ORAL | @ 13:00:00 | NDC 96295012867

## 2013-06-29 MED ADMIN — pantoprazole (PROTONIX) tablet 40 mg: ORAL | @ 10:00:00 | NDC 51079005101

## 2013-06-29 MED ADMIN — zolpidem (AMBIEN) tablet 10 mg: ORAL | @ 01:00:00 | NDC 51079072401

## 2013-06-29 MED ADMIN — folic acid (FOLVITE) tablet 1 mg: ORAL | @ 13:00:00 | NDC 63739053710

## 2013-06-29 MED ADMIN — thiamine (B-1) tablet 100 mg: ORAL | @ 13:00:00 | NDC 90011015050

## 2013-06-29 NOTE — Behavioral Health Treatment Team (Signed)
Pt rested in bed with eyes closed for approximately 7 hours. No reports made by pt of having any difficulty sleeping while staff conducted rounds. Respirations even and unlabored. No acute distress noted. Was maintained on q15min checks. Will continue to monitor.

## 2013-06-29 NOTE — Behavioral Health Treatment Team (Signed)
Social Work    Discharge noted for today.    Patient oriented x3, mood and affect within normal limits. Patient denies suicidal and homicidal ideation. No evidence of psychosis    Patient future oriented and eager to get to his placement at [facility].

## 2013-06-29 NOTE — Behavioral Health Treatment Team (Signed)
CIWA score this morning was 0. Patient stated that he was having no withdrawal symptoms. Patient was discharged today. Discharge forms were signed and given to patient. Discharge instructions were explained to patient and patient indicated understanding verbally. Patient has been compliant with taking medications and stated understanding the importance of taking medications upon discharge. Patient was given prescriptions and belongings, and was escorted from the unit by staff.

## 2013-06-29 NOTE — Behavioral Health Treatment Team (Signed)
Patient seen, staffing held and discharge summary dictated   Please see dictated report for full details. Patient stable for discharge and considered to be at low risk of harm to self or others.  Informed consent given to the use of discharge medications.  Treatment rounds held in full.  Spent greater than 35 minutes on discharge work.

## 2013-06-29 NOTE — Behavioral Health Treatment Team (Cosign Needed)
The patient Eugene Ferguson. is participating in [employer]	[place].   Group time: 30 minutes   Personal goal for participation: to orient the patient to the unit.   Goal orientation: successful adoption of unit rules   Group therapy participation: minimal   Therapeutic interventions reviewed and discussed: Yes   Impression of participation: minimal

## 2013-07-18 NOTE — Discharge Summary (Signed)
Name:       Eugene Ferguson, Eugene Ferguson                   Admitted:    06/23/2013                                               Discharged:  06/29/2013  Account #:  [id number]                     DOB:         09/24/1956  Consultant: Adela Lank, MD             Age          57                                 DISCHARGE SUMMARY      DISCHARGE DIAGNOSES  AXIS I: Schizophrenia, chronic, disorganized type; alcohol dependence,  noncompliance with treatment; tobacco dependence.  AXIS II: Deferred.  AXIS III: Hepatitis C, gastroesophageal reflux disease, obesity, cirrhosis  of liver, and history of traumatic brain injury.  AXIS IV: Mild to moderate on discharge.  AXIS V: 45-50 on discharge.    HISTORY OF PRESENT ILLNESS: Please see initial psychiatric evaluation in  the chart by this writer dated 06/24/2013 for full details as well as for  description of mental status examination at the time of presentation.    HOSPITALIZATION COURSE: The patient is a 57 year old single white male,  homeless, history of schizophrenia, disorganized type, and alcohol  dependence was admitted under TDO for suicidal ideation, alcohol dependence  and intoxication. The patient was seen the day after his admission. He was  cooperative and pleasant. The patient was totally disheveled and unkempt,  very poor hygiene. He appeared much older than his stated age. The patient  was started back on his low dose of Seroquel and Prozac. He was also  started on CIWA protocol with a loading dose of benzodiazepine.  His blood  alcohol level at the time of admission was 253.    By 06/25/2013, the patient was starting to feel better. He was not having  any significant withdrawal symptoms from alcohol. He required p.r.n. Ativan  x1 for anxiety. The patient's hygiene stayed very poor. He was cooperative  and compliant. By 06/26/2013, the patient's blood pressure was staying high  and his heart rate was high. The patient's albumin level was low. The  patient was not clear  where he would be going from here. His hygiene  remains poor. The patient was encouraged to take shower. The patient was  compliant with his medication. By 06/28/2013, the patient was starting to  feel better. His hygiene got really improved.  [facility]  was not able to assist in the placement of this patient. Team social worker  found a new group home for the patient.     On the day of discharge, the patient was free of suicidal and homicidal  ideation. The patient contracted for safety outside of the hospital. The  patient was stable for discharge and considered to be at low risk of harm  to self or others.    DISCHARGE MEDICATIONS  1.  Protonix 40 mg a.c. breakfast.  2.  Aspirin 81 mg daily.  3.  Multivitamin with iron 1 daily.  4.  Folic acid 1 mg daily.  5.  Thiamine 100 mg daily.  6.  Prozac 20 mg daily.  7.  Seroquel 100 mg at bedtime.    DISPOSITION: The patient was discharged to [facility] adult home. The  patient will have psychiatric followup with Dr. Leilani Merl.    PROGNOSIS:  Fair.              Adela Lank, MD    cc:    Adela Lank, MD      SAL/wmx; D: 07/18/2013 01:09 P; T: 07/18/2013 02:24 P; DOC# [id number]; Job#  [id number]

## 2015-04-11 ENCOUNTER — Inpatient Hospital Stay
Admit: 2015-04-11 | Discharge: 2015-04-11 | Disposition: A | Discharge status: Home / Self Care | Payer: MEDICAID | Attending: Emergency Medicine

## 2015-04-11 DIAGNOSIS — J069 Acute upper respiratory infection, unspecified: Secondary | ICD-10-CM

## 2015-04-11 MED ORDER — LORATADINE 10 MG TAB
10 mg | ORAL_TABLET | Freq: Every day | ORAL | Status: AC
Start: 2015-04-11 — End: ?

## 2015-04-11 MED ORDER — CODEINE-GUAIFENESIN 10 MG-100 MG/5 ML ORAL LIQUID
100-10 mg/5 mL | Freq: Three times a day (TID) | ORAL | Status: DC | PRN
Start: 2015-04-11 — End: 2015-07-26

## 2015-04-11 MED ORDER — OXYMETAZOLINE 0.05 % NASAL SPRAY AEROSOL
0.05 % | Freq: Two times a day (BID) | NASAL | Status: AC
Start: 2015-04-11 — End: 2015-04-14

## 2015-04-11 NOTE — ED Notes (Signed)
Patient had an IV listed on his chart from 2013, I removed it because it was not present on this admission to the ED

## 2015-04-11 NOTE — ED Notes (Signed)
Patient states he is here today with c/o nasal congestion, coughing and sneezing x 3 days.

## 2015-04-11 NOTE — ED Notes (Signed)
Emergency Department Nursing Plan of Care       The Nursing Plan of Care is developed from the Nursing assessment and Emergency Department Attending provider initial evaluation.  The plan of care may be reviewed in the ???ED Provider note???.    The Plan of Care was developed with the following considerations:   Patient / Family readiness to learn indicated [id number]:verbalized understanding  Persons(s) to be included in education: patient  Barriers to Learning/Limitations:No    Signed     Nicholes RoughStephanie R. Barringer, RN    04/11/2015   12:27 PM

## 2015-04-11 NOTE — ED Notes (Signed)
Patient (s)  given copy of dc instructions and 3 script(s).  Patient (s)  verbalized understanding of instructions and script (s).  Patient given a current medication reconciliation form and verbalized understanding of their medications.   Patient (s) verbalized understanding of the importance of discussing medications with  his or her physician or clinic they will be following up with.  Patient alert and oriented and in no acute distress.  Patient discharged home ambulatory.
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
## 2015-07-23 ENCOUNTER — Inpatient Hospital Stay
Admit: 2015-07-23 | Discharge: 2015-07-26 | Disposition: A | Discharge status: Home / Self Care | Payer: PRIVATE HEALTH INSURANCE | Attending: Geriatric Psychiatry | Admitting: Geriatric Psychiatry

## 2015-07-23 DIAGNOSIS — F1024 Alcohol dependence with alcohol-induced mood disorder: Principal | ICD-10-CM

## 2015-07-23 LAB — DRUG SCREEN, URINE
AMPHETAMINES: NEGATIVE
BARBITURATES: NEGATIVE
BENZODIAZEPINES: NEGATIVE
COCAINE: POSITIVE — AB
METHADONE: NEGATIVE
OPIATES: NEGATIVE
PCP(PHENCYCLIDINE): NEGATIVE
THC (TH-CANNABINOL): NEGATIVE

## 2015-07-23 LAB — CBC WITH AUTOMATED DIFF
ABS. BASOPHILS: 0 10*3/uL (ref 0.0–0.1)
ABS. EOSINOPHILS: 0.1 10*3/uL (ref 0.0–0.4)
ABS. LYMPHOCYTES: 2.1 10*3/uL (ref 0.8–3.5)
ABS. MONOCYTES: 0.7 10*3/uL (ref 0.0–1.0)
ABS. NEUTROPHILS: 3.9 10*3/uL (ref 1.8–8.0)
BASOPHILS: 0 % (ref 0–1)
EOSINOPHILS: 1 % (ref 0–7)
HCT: 41.5 % (ref 36.6–50.3)
HGB: 14.4 g/dL (ref 12.1–17.0)
LYMPHOCYTES: 31 % (ref 12–49)
MCH: 32.7 PG (ref 26.0–34.0)
MCHC: 34.7 g/dL (ref 30.0–36.5)
MCV: 94.3 FL (ref 80.0–99.0)
MONOCYTES: 10 % (ref 5–13)
NEUTROPHILS: 58 % (ref 32–75)
PLATELET: 69 10*3/uL — ABNORMAL LOW (ref 150–400)
RBC: 4.4 M/uL (ref 4.10–5.70)
RDW: 15 % — ABNORMAL HIGH (ref 11.5–14.5)
WBC: 6.8 10*3/uL (ref 4.1–11.1)

## 2015-07-23 LAB — METABOLIC PANEL, COMPREHENSIVE
A-G Ratio: 0.6 — ABNORMAL LOW (ref 1.1–2.2)
ALT (SGPT): 53 U/L (ref 12–78)
AST (SGOT): 80 U/L — ABNORMAL HIGH (ref 15–37)
Albumin: 3.4 g/dL — ABNORMAL LOW (ref 3.5–5.0)
Alk. phosphatase: 91 U/L (ref 45–117)
Anion gap: 14 mmol/L (ref 5–15)
BUN/Creatinine ratio: 7 — ABNORMAL LOW (ref 12–20)
BUN: 5 MG/DL — ABNORMAL LOW (ref 6–20)
Bilirubin, total: 2.6 MG/DL — ABNORMAL HIGH (ref 0.2–1.0)
CO2: 22 mmol/L (ref 21–32)
Calcium: 8.9 MG/DL (ref 8.5–10.1)
Chloride: 103 mmol/L (ref 97–108)
Creatinine: 0.75 MG/DL (ref 0.70–1.30)
GFR est AA: >60 mL/min/{1.73_m2} (ref >60)
GFR est non-AA: >60 mL/min/{1.73_m2} (ref >60)
Globulin: 5.5 g/dL — ABNORMAL HIGH (ref 2.0–4.0)
Glucose: 138 mg/dL — ABNORMAL HIGH (ref 65–100)
Potassium: 4.3 mmol/L (ref 3.5–5.1)
Protein, total: 8.9 g/dL — ABNORMAL HIGH (ref 6.4–8.2)
Sodium: 139 mmol/L (ref 136–145)

## 2015-07-23 LAB — ETHYL ALCOHOL: ALCOHOL(ETHYL),SERUM: 292 MG/DL — ABNORMAL HIGH (ref <10)

## 2015-07-23 LAB — URINALYSIS W/ REFLEX CULTURE
Bacteria: NEGATIVE /hpf
Bilirubin: NEGATIVE
Blood: NEGATIVE
Glucose: NEGATIVE mg/dL
Ketone: NEGATIVE mg/dL
Leukocyte Esterase: NEGATIVE
Nitrites: NEGATIVE
Protein: NEGATIVE mg/dL
Specific gravity: <1.005 (ref 1.003–1.030)
Urobilinogen: 1 EU/dL (ref 0.2–1.0)
pH (UA): 5.5 (ref 5.0–8.0)

## 2015-07-23 MED ORDER — LORAZEPAM 2 MG/ML IJ SOLN
2 mg/mL | INTRAMUSCULAR | Status: DC | PRN
Start: 2015-07-23 — End: 2015-07-23

## 2015-07-23 MED ORDER — NICOTINE 21 MG/24 HR DAILY PATCH
21 mg/24 hr | Freq: Every day | TRANSDERMAL | Status: DC | PRN
Start: 2015-07-23 — End: 2015-07-26

## 2015-07-23 MED ORDER — ZIPRASIDONE MESYLATE 20 MG IM SOLR
20 mg/mL (final conc.) | Freq: Two times a day (BID) | INTRAMUSCULAR | Status: DC | PRN
Start: 2015-07-23 — End: 2015-07-26

## 2015-07-23 MED ORDER — SODIUM CHLORIDE 0.9% BOLUS IV
0.9 % | Freq: Once | INTRAVENOUS | Status: AC
Start: 2015-07-23 — End: 2015-07-23
  Administered 2015-07-23: 17:00:00 via INTRAVENOUS

## 2015-07-23 MED ORDER — THIAMINE HCL 100 MG TAB
100 mg | Freq: Every day | ORAL | Status: DC
Start: 2015-07-23 — End: 2015-07-26
  Administered 2015-07-24 – 2015-07-26 (×3): via ORAL

## 2015-07-23 MED ORDER — LORAZEPAM 2 MG/ML IJ SOLN
2 mg/mL | INTRAMUSCULAR | Status: AC
Start: 2015-07-23 — End: 2015-07-23
  Administered 2015-07-23: 17:00:00 via INTRAMUSCULAR

## 2015-07-23 MED ORDER — LORAZEPAM 1 MG TAB
1 mg | ORAL | Status: DC | PRN
Start: 2015-07-23 — End: 2015-07-26

## 2015-07-23 MED ORDER — NICOTINE 14 MG/24 HR DAILY PATCH
14 mg/24 hr | TRANSDERMAL | Status: AC
Start: 2015-07-23 — End: 2015-07-24

## 2015-07-23 MED ORDER — ZIPRASIDONE MESYLATE 20 MG IM SOLR
20 mg/mL (final conc.) | INTRAMUSCULAR | Status: AC
Start: 2015-07-23 — End: 2015-07-23
  Administered 2015-07-23: 17:00:00 via INTRAMUSCULAR

## 2015-07-23 MED ORDER — SALINE PERIPHERAL FLUSH PRN
INTRAMUSCULAR | Status: DC | PRN
Start: 2015-07-23 — End: 2015-07-23

## 2015-07-23 MED ORDER — ACETAMINOPHEN 325 MG TABLET
325 mg | ORAL | Status: DC | PRN
Start: 2015-07-23 — End: 2015-07-26

## 2015-07-23 MED ORDER — PROMETHAZINE 25 MG TAB
25 mg | Freq: Four times a day (QID) | ORAL | Status: DC | PRN
Start: 2015-07-23 — End: 2015-07-26

## 2015-07-23 MED ORDER — GABAPENTIN 300 MG CAP
300 mg | Freq: Three times a day (TID) | ORAL | Status: DC
Start: 2015-07-23 — End: 2015-07-26
  Administered 2015-07-24 – 2015-07-26 (×8): via ORAL

## 2015-07-23 MED ORDER — IPRATROPIUM-ALBUTEROL 2.5 MG-0.5 MG/3 ML NEB SOLUTION
2.5 mg-0.5 mg/3 ml | RESPIRATORY_TRACT | Status: AC
Start: 2015-07-23 — End: 2015-07-23
  Administered 2015-07-23: 19:00:00 via RESPIRATORY_TRACT

## 2015-07-23 MED ORDER — BENZTROPINE 1 MG/ML IJ SOLN
1 mg/mL | Freq: Two times a day (BID) | INTRAMUSCULAR | Status: DC | PRN
Start: 2015-07-23 — End: 2015-07-26

## 2015-07-23 MED ORDER — MAGNESIUM HYDROXIDE 400 MG/5 ML ORAL SUSP
400 mg/5 mL | Freq: Every day | ORAL | Status: DC | PRN
Start: 2015-07-23 — End: 2015-07-26

## 2015-07-23 MED ORDER — IBUPROFEN 400 MG TAB
400 mg | Freq: Three times a day (TID) | ORAL | Status: DC | PRN
Start: 2015-07-23 — End: 2015-07-26

## 2015-07-23 MED ORDER — ZOLPIDEM 10 MG TAB
10 mg | Freq: Every evening | ORAL | Status: DC | PRN
Start: 2015-07-23 — End: 2015-07-26
  Administered 2015-07-26: 04:00:00 via ORAL

## 2015-07-23 MED ORDER — THIAMINE 100 MG/ML INJECTION
100 mg/mL | INTRAMUSCULAR | Status: AC
Start: 2015-07-23 — End: 2015-07-23
  Administered 2015-07-23: 18:00:00 via INTRAVENOUS

## 2015-07-23 MED ORDER — BENZTROPINE 2 MG TAB
2 mg | Freq: Two times a day (BID) | ORAL | Status: DC | PRN
Start: 2015-07-23 — End: 2015-07-26

## 2015-07-23 MED ORDER — OLANZAPINE 5 MG TAB
5 mg | Freq: Four times a day (QID) | ORAL | Status: DC | PRN
Start: 2015-07-23 — End: 2015-07-26

## 2015-07-23 MED ORDER — MULTIVITAMIN-IRON 9 MG-FOLIC ACID 400 MCG-CALCIUM & MINERALS TABLET
9 mg iron-400 mcg | Freq: Every day | ORAL | Status: DC
Start: 2015-07-23 — End: 2015-07-26
  Administered 2015-07-24 – 2015-07-26 (×3): via ORAL

## 2015-07-23 MED ORDER — LORAZEPAM 1 MG TAB
1 mg | ORAL | Status: DC | PRN
Start: 2015-07-23 — End: 2015-07-26
  Administered 2015-07-24 – 2015-07-26 (×3): via ORAL

## 2015-07-23 MED ORDER — FOLIC ACID 1 MG TAB
1 mg | Freq: Every day | ORAL | Status: DC
Start: 2015-07-23 — End: 2015-07-26
  Administered 2015-07-24 – 2015-07-26 (×3): via ORAL

## 2015-07-23 MED FILL — NICOTINE 14 MG/24 HR DAILY PATCH: 14 mg/24 hr | TRANSDERMAL | Qty: 1

## 2015-07-23 MED FILL — SODIUM CHLORIDE 0.9 % IV: INTRAVENOUS | Qty: 1000

## 2015-07-23 MED FILL — IPRATROPIUM-ALBUTEROL 2.5 MG-0.5 MG/3 ML NEB SOLUTION: 2.5 mg-0.5 mg/3 ml | RESPIRATORY_TRACT | Qty: 3

## 2015-07-23 MED FILL — LORAZEPAM 2 MG/ML IJ SOLN: 2 mg/mL | INTRAMUSCULAR | Qty: 1

## 2015-07-23 NOTE — Behavioral Health Treatment Team (Signed)
TRANSFER - IN REPORT:    Verbal report received from [facility] RNon Chaney Maclaren  being received from ED for routine progression of care      Report consisted of patient???s Situation, Background, Assessment and   Recommendations(SBAR).     Information from the following report(s) SBAR was reviewed with the receiving nurse.    Opportunity for questions and clarification was provided.      Assessment completed upon patient???s arrival to unit and care assumed.

## 2015-07-23 NOTE — ED Notes (Signed)
[employer] called.

## 2015-07-23 NOTE — ED Notes (Signed)
Patient sleeping, [employer] at door.

## 2015-07-23 NOTE — ED Notes (Signed)
Patient refusing to change clothing.

## 2015-07-23 NOTE — ED Notes (Addendum)
ECO in progress.  Per Crisis management, reccommending TDO.  Patient attempting to leave room.

## 2015-07-23 NOTE — ED Notes (Signed)
Restraints removed.  Patient sleeping.
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
## 2015-07-23 NOTE — ED Notes (Signed)
TRANSFER - OUT REPORT:    Verbal report given to Luciana Axe, RN(name) on [name]  being transferred to Behavioral Health(unit) for routine progression of care       Report consisted of patient???s Situation, Background, Assessment and   Recommendations(SBAR).     Information from the following report(s) SBAR, Kardex, ED Summary, Intake/Output, MAR, Recent Results and Med Rec Status was reviewed with the receiving nurse.    Lines:   Peripheral IV 07/23/15 Left Antecubital (Active)        Opportunity for questions and clarification was provided.      Patient transported with:   Tech and security
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
## 2015-07-23 NOTE — Behavioral Health Treatment Team (Addendum)
This 59 year old male admitted under the services of Dr. Lelon Huh with diagnosis of schizophrenia . Patient is a TDO and brought in by [facility] to [facility]. Patient was intoxicated,and came to ED stating he was having A/V hallucinations. 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 Patient in 4 point restraints and medicated.BAL 292. Patient given IVF'S . Patient came to unit with neck collar on, due to "snoring" loudly and nurse stated it would keep his airway open. Patient speech garbled,difficult to understand. Patient is unsteady on his feet, assisted to room, skin assessment done by male techs. Patient ate dinner and sleeping in bed at this time. Staff will continue safety checks and monitor CIWA.    100pm: Patient sleeping at this time,breathing unlabored,even respirations

## 2015-07-23 NOTE — ED Notes (Signed)
Provider notified of completion of IV fluids and BP readings.

## 2015-07-23 NOTE — Behavioral Health Treatment Team (Signed)
PT did not attend process group, held from 5 pm to 525 pm

## 2015-07-23 NOTE — Behavioral Health Treatment Team (Cosign Needed)
Clt, didn't attend reflection group

## 2015-07-23 NOTE — ED Notes (Signed)
Provider notified of respiratory assessment.  Respiratory therapist paged.

## 2015-07-23 NOTE — ED Notes (Signed)
I have reviewed & I agree with the nurses assessment.

## 2015-07-23 NOTE — ED Notes (Addendum)
Patient returned to room.  Marcelino Duster from Crisis Management and sheriff in room.

## 2015-07-23 NOTE — ED Notes (Signed)
Refusing to change into gown.  Left room and refusing to return.  States he wants to leave.  Press photographer and security notified.

## 2015-07-24 LAB — LIPID PANEL
CHOL/HDL Ratio: 4.2 (ref 0–5.0)
Cholesterol, total: 143 MG/DL (ref <200)
HDL Cholesterol: 34 MG/DL
LDL, calculated: 83.2 MG/DL (ref 0–100)
Triglyceride: 129 MG/DL (ref <150)
VLDL, calculated: 25.8 MG/DL

## 2015-07-24 LAB — GLUCOSE, FASTING: Glucose: 87 MG/DL (ref 65–100)

## 2015-07-24 LAB — TSH 3RD GENERATION: TSH: 1.91 u[IU]/mL (ref 0.36–3.74)

## 2015-07-24 MED ORDER — LORATADINE 10 MG TAB
10 mg | Freq: Every day | ORAL | Status: DC
Start: 2015-07-24 — End: 2015-07-26
  Administered 2015-07-24 – 2015-07-26 (×3): via ORAL

## 2015-07-24 MED ORDER — ALBUTEROL SULFATE HFA 90 MCG/ACTUATION AEROSOL INHALER
90 mcg/actuation | Freq: Four times a day (QID) | RESPIRATORY_TRACT | Status: DC | PRN
Start: 2015-07-24 — End: 2015-07-26

## 2015-07-24 MED FILL — GABAPENTIN 300 MG CAP: 300 mg | ORAL | Qty: 1

## 2015-07-24 MED FILL — FOLIC ACID 1 MG TAB: 1 mg | ORAL | Qty: 1

## 2015-07-24 MED FILL — THERA M PLUS (FERROUS FUMARATE) 9 MG IRON-400 MCG TABLET: 9 mg iron-400 mcg | ORAL | Qty: 1

## 2015-07-24 MED FILL — LORAZEPAM 1 MG TAB: 1 mg | ORAL | Qty: 2

## 2015-07-24 MED FILL — NICOTINE 14 MG/24 HR DAILY PATCH: 14 mg/24 hr | TRANSDERMAL | Qty: 1

## 2015-07-24 MED FILL — LORATADINE 10 MG TAB: 10 mg | ORAL | Qty: 1

## 2015-07-24 MED FILL — VITAMIN B-1 100 MG TABLET: 100 mg | ORAL | Qty: 1

## 2015-07-24 NOTE — Progress Notes (Signed)
Problem: Suicide/Homicide (Adult/Pediatric)  Goal: *STG: Remains safe in hospital  Client has spent the shift resting in bed.  Did wake up and talk with Dr Basilia Jumbo, but not very forthcoming.  Did not open his eyes.  Will continue to observe with q 15 min checks.

## 2015-07-24 NOTE — Behavioral Health Treatment Team (Cosign Needed)
GROUP THERAPY PROGRESS NOTE    The patient Eugene Ferguson a 59 y.o. male is participating in Creative [employer] Group.     Group time: 1 hour    Personal goal for participation: To concentrate on selected task    Goal orientation: social    Group therapy participation: active    Therapeutic interventions reviewed and discussed: Crafts, games, music    Impression of participation: The patient was attentive.    BEVERLY S BAKER  07/24/2015  5:44 PM

## 2015-07-24 NOTE — Behavioral Health Treatment Team (Signed)
59 year old single, white male TDO'd for reported auditory and visual hallucinations.  The patient had blood shot eyes, a bruise on the upper right side of his nose, exhibited no gross psychosis nor SI/HI and seemed clearer in thought than how he presented in the ER.  The patient had a BAL of 293 upon admission and was positive for cocaine.  He said he lives alone in an apartment and hasn't had psychiatric treatment in a long time, nor been on any psychotropic medications.  Worker left a message at [employer] office, Dr. Juliann Pulse, to verify current medications.      The patient refused referral to the [place] and told ED staff he was a [place] veteran.  The patient was on this unit in 2014 and carried a diagnosis of schizophrenia.  Worker will continue to provide support through treatment and discharge planning.     Eugene Culver, LCSW

## 2015-07-24 NOTE — Progress Notes (Signed)
Problem: Suicide/Homicide (Adult/Pediatric)  Goal: *STG/LTG: Complies with medication therapy  Outcome: Progressing Towards Goal  Patient is calm and cooperative. Mostly isolative to self. Patient being evaluated by the CIWA protocol. Medication and meal complaint. Patient denies SI/HI. Psychosis. Staff will continue to monitor patient and assess needs.

## 2015-07-24 NOTE — ACP (Advance Care Planning) (Cosign Needed)
GROUP THERAPY PROGRESS NOTE    Eugene Ferguson is participating in [place].     Group time: 30 minutes    Personal goal for participation: daily orientation    Goal orientation: personal    Group therapy participation: active    Therapeutic interventions reviewed and discussed: yes    Impression of participation: cooperative

## 2015-07-24 NOTE — Consults (Signed)
Medical Consult for [facility] Patient    Consult H&P   dictated, see patient chart    Impression:    Eugene Ferguson a 59 y.o. male with past medical history of shizophrenia, liver disease, asthma, and gerd presents with behavioral health problems of auditory and visual hallucinations admitted for further psychiatric evaluation and treatment.    Plan:   1. Psychiatry to manage mental health issues  2. Continuing home medications, once confirmed.  3. Pt will need to f/u with liver specialist regarding liver disease and throombocytoenia  4. Appears medical stable at this time, will follow along during this hospitalizaiton  5. No VTE prophylaxis indicated or necessary at this time.     Thank you  Fay Records, MD  07/24/2015, 5:12 PM

## 2015-07-24 NOTE — Behavioral Health Treatment Team (Cosign Needed)
GROUP THERAPY PROGRESS NOTE    The patient Eugene Ferguson a 59 y.o. male is participating in Coping Skills Group.     Group time: 45 minutes    Personal goal for participation: To identify positive coping strategies a-z    Goal orientation:  personal    Group therapy participation: active    Therapeutic interventions reviewed and discussed: worksheet    Impression of participation:  The patient was attentive.    BEVERLY S BAKER  07/24/2015  12:02 PM
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
## 2015-07-24 NOTE — Behavioral Health Treatment Team (Signed)
Patient slept 7hrs during the night, respirations easy and unlabored. Labs drawn, tolerated well.

## 2015-07-24 NOTE — Behavioral Health Treatment Team (Cosign Needed)
Pt did not attend reflections group

## 2015-07-25 MED ORDER — LABETALOL 100 MG TAB
100 mg | Freq: Two times a day (BID) | ORAL | Status: DC
Start: 2015-07-25 — End: 2015-07-25

## 2015-07-25 MED ORDER — FLUOXETINE 20 MG CAP
20 mg | Freq: Every day | ORAL | Status: DC
Start: 2015-07-25 — End: 2015-07-26
  Administered 2015-07-25 – 2015-07-26 (×2): via ORAL

## 2015-07-25 MED FILL — THERA M PLUS (FERROUS FUMARATE) 9 MG IRON-400 MCG TABLET: 9 mg iron-400 mcg | ORAL | Qty: 1

## 2015-07-25 MED FILL — FLUOXETINE 20 MG CAP: 20 mg | ORAL | Qty: 1

## 2015-07-25 MED FILL — LORAZEPAM 1 MG TAB: 1 mg | ORAL | Qty: 2

## 2015-07-25 MED FILL — GABAPENTIN 300 MG CAP: 300 mg | ORAL | Qty: 1

## 2015-07-25 MED FILL — FOLIC ACID 1 MG TAB: 1 mg | ORAL | Qty: 1

## 2015-07-25 MED FILL — VITAMIN B-1 100 MG TABLET: 100 mg | ORAL | Qty: 1

## 2015-07-25 MED FILL — LORATADINE 10 MG TAB: 10 mg | ORAL | Qty: 1

## 2015-07-25 NOTE — Progress Notes (Signed)
Problem: Suicide/Homicide (Adult/Pediatric)  Goal: *STG: Remains safe in hospital  Outcome: Progressing Towards Goal  Pt has been isolative to room.Compliant with meals and medications.CIWA 0.Pt says he is feeling better.No participation in groups.Denies SI/HI.Contracts for safety.Continue to monitor pt for s/s of withdrawal and anticipate needs.

## 2015-07-25 NOTE — Behavioral Health Treatment Team (Cosign Needed)
Pt did not attend coping skills group.

## 2015-07-25 NOTE — Progress Notes (Signed)
Initial Nutrition Assessment:    INTERVENTIONS/RECOMMENDATIONS:   ?? Meals/Snacks: General/healthful diet:  Continue regular diet.  Enc po/fluids.    ASSESSMENT:   Chart reviewed; med noted for mood disorder.  Hx of alcohol and polysubstance abuse.  MST trigger for unsure weight loss.  No acute nutrition dx at this time.    Diet Order: Regular  % Eaten:  No data found.    Pertinent Medications: Reviewed Other  Pertinent Labs: Reviewed Other  Food Allergies: None Other   Last BM:    Active     Hyperactive  Hypoactive        Absent BS  Skin:     Intact    Incision   Breakdown   Other:    Anthropometrics:   Height:  (175.3 cm) Weight: 81.6 kg (180 lb)   IBW (%IBW):   ( ) UBW (%UBW):   (  %)   Last Weight Metrics:  Weight Loss Metrics 07/23/2015 04/11/2015 06/24/2013 11/09/2012 12/24/2011 12/02/2010 06/20/2010   Today's Wt 180 lb 195 lb 180 lb 180 lb 160 lb 199 lb 165 lb   BMI 26.58 kg/m2 28.78 kg/m2 26.57 kg/m2 26.57 kg/m2 25.84 kg/m2 30.26 kg/m2 24.36 kg/m2       BMI: Body mass index is 26.58 kg/(m^2).    This BMI is indicative of:   Underweight    Normal    Overweight     Obesity    Extreme Obesity (BMI>40)     Estimated Nutrition Needs (Based on):   2119 Kcals/day (BMR (1630) x 1.3AF) , 82 g (1.0 g/kg bw) Protein  Carbohydrate: At Least 130 g/day  Fluids: 2100 mL/day (15ml/kcal)    Pt expected to meet estimated nutrient needs: Yes No    NUTRITION DIAGNOSES:   Problem:   (No acute nutrition dx)      Etiology: related to       Signs/Symptoms: as evidenced by        NUTRITION INTERVENTIONS:  Meals/Snacks: General/healthful diet                  GOAL:   PO intake at leastb 50% of meals next 5-7 days    LEARNING NEEDS (Diet, Food/Nutrient-Drug Interaction):     None Identified    Identified and Education Provided/Documented    Identified and Pt declined/was not appropriate     Cultureal, Religious, OR Ethnic Dietary Needs:     None Identified    Identified and Addressed      Interdisciplinary Care Plan Reviewed/Documented      Discharge Planning: Continue regular diet as tolerated      MONITORING /EVALUATION:      Food/Nutrient Intake Outcomes: Total energy intake  Physical Signs/Symptoms Outcomes: Weight/weight change    NUTRITION RISK:     High               Moderate             Low    Minimal/Uncompromised    PT SEEN FOR:      MD Consult: Calorie Count      Diabetic Diet Education        Diet Education     Electrolyte Management     General Nutrition Management and Supplements     Management of Tube Feeding     TPN Recommendations      RN Referral:  MST score >=2     Enteral/Parenteral Nutrition PTA     Pregnant: Gestational DM or Multigestation  Pressure Ulcer/Wound Care needs          Low BMI    DTR Referral       Berenice Primas, [place]  Pager [phone]  Weekend Pager [phone]

## 2015-07-25 NOTE — Progress Notes (Signed)
General Daily Progress Note    Admit Date: 07/23/2015    Subjective:     Asked to evaluate pt for need for antihypertensive. BP was noted to be high yesterday but since tyhen has been normal.      Current Facility-Administered Medications   Medication Dose Route Frequency   ??? FLUoxetine (PROzac) capsule 20 mg  20 mg Oral DAILY   ??? albuterol (PROVENTIL HFA, VENTOLIN HFA, PROAIR HFA) inhaler 2 Puff  2 Puff Inhalation Q6H PRN   ??? loratadine (CLARITIN) tablet 10 mg  10 mg Oral DAILY   ??? gabapentin (NEURONTIN) capsule 300 mg  300 mg Oral TID   ??? LORazepam (ATIVAN) tablet 2 mg  2 mg Oral Q1H PRN   ??? LORazepam (ATIVAN) tablet 4 mg  4 mg Oral Q1H PRN   ??? promethazine (PHENERGAN) tablet 25 mg  25 mg Oral Q6H PRN   ??? folic acid (FOLVITE) tablet 1 mg  1 mg Oral DAILY   ??? thiamine (B-1) tablet 100 mg  100 mg Oral DAILY   ??? multivitamin, tx-iron-ca-min (THERA-M w/ IRON) tablet 1 Tab  1 Tab Oral DAILY   ??? ziprasidone (GEODON) 20 mg in sterile water (preservative free) 1 mL injection  20 mg IntraMUSCular BID PRN   ??? OLANZapine (ZyPREXA) tablet 5 mg  5 mg Oral Q6H PRN   ??? benztropine (COGENTIN) tablet 2 mg  2 mg Oral BID PRN   ??? benztropine (COGENTIN) injection 2 mg  2 mg IntraMUSCular BID PRN   ??? zolpidem (AMBIEN) tablet 10 mg  10 mg Oral QHS PRN   ??? acetaminophen (TYLENOL) tablet 650 mg  650 mg Oral Q4H PRN   ??? ibuprofen (MOTRIN) tablet 400 mg  400 mg Oral Q8H PRN   ??? magnesium hydroxide (MILK OF MAGNESIA) oral suspension 30 mL  30 mL Oral DAILY PRN   ??? nicotine (NICODERM CQ) 21 mg/24 hr patch 1 Patch  1 Patch TransDERmal DAILY PRN            Objective:     Patient Vitals for the past 8 hrs:   BP Temp Pulse   07/25/15 1602 141/87 97.9 ??F (36.6 ??C) 87   07/25/15 1355 145/72 - 86   07/25/15 1308 (!) 166/101 - 94             Physical Exam:   Visit Vitals   ??? BP 141/87   ??? Pulse 87   ??? Temp 97.9 ??F (36.6 ??C)   ??? Resp 18   ??? Ht  (1.753 m)   ??? Wt 81.6 kg (180 lb)   ??? SpO2 96%   ??? BMI 26.58 kg/m2      General:  Alert, cooperative, no distress, appears stated age.   Head:  Normocephalic, without obvious abnormality, atraumatic.   Eyes:  Conjunctivae/corneas clear. PERRL, EOMs intact.            Lungs:   Clear to auscultation bilaterally.   Chest wall:  No tenderness or deformity.   Heart:  Regular rate and rhythm, S1, S2 normal, no murmur, click, rub or gallop.   Abdomen:   Soft, non-tender. Bowel sounds normal. No masses,  No organomegaly.           Extremities: Extremities normal, atraumatic, no cyanosis or edema.   Pulses: 2+ and symmetric all extremities.   Skin: Skin color, texture, turgor normal. No rashes or lesions             No  results found for this or any previous visit (from the past 24 hour(s)).      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

## 2015-07-25 NOTE — Progress Notes (Signed)
Problem: Suicide/Homicide (Adult/Pediatric)  Goal: *STG: Remains safe in hospital  Outcome: Progressing Towards Goal  Patient has been resting in their room with their eyes closed and has showed no signs of distress through out the shift. Patient slept for 7 hours. Patient is on every 15 minute checks for safety.

## 2015-07-25 NOTE — Progress Notes (Signed)
Problem: Suicide/Homicide (Adult/Pediatric)  Goal: *STG: Remains safe in hospital  Client's b/p lower this pm, talked with Dr Concha Norway, will continue to observe for 24 hours.  Has been out of his room more, watching tv.  Very little interaction between client and his peers.  Meal and med compliant, very quietly stays to himself.  Q 15 min checks for safety continue.

## 2015-07-25 NOTE — Behavioral Health Treatment Team (Cosign Needed)
Eugene Ferguson participated in reflections group. He became very agitated when told he could not get a number out of cell phone.Eugene Ferguson expressed that he had a good day and was doing well.

## 2015-07-25 NOTE — Behavioral Health Treatment Team (Signed)
Eugene Ferguson consulted for patient elevated BP patient added on list for MD to see today. Will continue to monitor patient and assess needs.
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
## 2015-07-25 NOTE — Behavioral Health Treatment Team (Signed)
Pt did not participate in coping skills group.

## 2015-07-25 NOTE — Behavioral Health Treatment Team (Signed)
SOCIAL WORK NOTE:  Pt did not attend processing group.    Priscilla Witwer, MSW, Supervisee in Social Work

## 2015-07-25 NOTE — ACP (Advance Care Planning) (Cosign Needed)
GROUP THERAPY PROGRESS NOTE    Eugene Ferguson is participating in [place].     Group time: 30 minutes    Personal goal for participation: daily orientation    Goal orientation: personal    Group therapy participation: minimal    Therapeutic interventions reviewed and discussed: yes    Impression of participation: needed prompting

## 2015-07-25 NOTE — Behavioral Health Treatment Team (Cosign Needed)
Pt did not attend creative expression group.
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
## 2015-07-26 MED ORDER — FLUOXETINE 20 MG CAP
20 mg | ORAL_CAPSULE | Freq: Every day | ORAL | 0 refills | Status: AC
Start: 2015-07-26 — End: 2015-08-25

## 2015-07-26 MED ORDER — GABAPENTIN 300 MG CAP
300 mg | ORAL_CAPSULE | Freq: Three times a day (TID) | ORAL | 0 refills | Status: AC
Start: 2015-07-26 — End: 2015-08-25

## 2015-07-26 MED ORDER — MULTIVITAMIN-IRON 9 MG-FOLIC ACID 400 MCG-CALCIUM & MINERALS TABLET
9 mg iron-400 mcg | ORAL_TABLET | Freq: Every day | ORAL | 0 refills | Status: AC
Start: 2015-07-26 — End: 2015-08-25

## 2015-07-26 MED ORDER — THIAMINE HCL 100 MG TAB
100 mg | ORAL_TABLET | Freq: Every day | ORAL | 0 refills | Status: AC
Start: 2015-07-26 — End: 2015-08-25

## 2015-07-26 MED ORDER — FOLIC ACID 1 MG TAB
1 mg | ORAL_TABLET | Freq: Every day | ORAL | 0 refills | Status: AC
Start: 2015-07-26 — End: 2015-08-25

## 2015-07-26 MED FILL — VITAMIN B-1 100 MG TABLET: 100 mg | ORAL | Qty: 1

## 2015-07-26 MED FILL — GABAPENTIN 300 MG CAP: 300 mg | ORAL | Qty: 1

## 2015-07-26 MED FILL — FLUOXETINE 20 MG CAP: 20 mg | ORAL | Qty: 1

## 2015-07-26 MED FILL — LORAZEPAM 1 MG TAB: 1 mg | ORAL | Qty: 2

## 2015-07-26 MED FILL — LORATADINE 10 MG TAB: 10 mg | ORAL | Qty: 1

## 2015-07-26 MED FILL — THERA M PLUS (FERROUS FUMARATE) 9 MG IRON-400 MCG TABLET: 9 mg iron-400 mcg | ORAL | Qty: 1

## 2015-07-26 MED FILL — ZOLPIDEM 10 MG TAB: 10 mg | ORAL | Qty: 1

## 2015-07-26 MED FILL — FOLIC ACID 1 MG TAB: 1 mg | ORAL | Qty: 1

## 2015-07-26 NOTE — Progress Notes (Signed)
Problem: Suicide/Homicide (Adult/Pediatric)  Goal: *STG: Remains safe in hospital  Outcome: Progressing Towards Goal  Patient has been resting in their room with their eyes closed and has showed no signs of distress through out the shift. Patient slept for 5.5 hours. Patient is on every 15 minute checks for safety.
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
## 2015-07-26 NOTE — Behavioral Health Treatment Team (Cosign Needed)
GROUP THERAPY PROGRESS NOTE    Eugene Ferguson is participating in [place].     Group time: 30 minutes    Personal goal for participation: daily orientation    Goal orientation: personal    Group therapy participation: active    Therapeutic interventions reviewed and discussed: yes    Impression of participation: good

## 2015-07-26 NOTE — Discharge Summary (Signed)
PSYCHIATRIC DISCHARGE SUMMARY         IDENTIFICATION:    Patient Name  Eugene Ferguson   Date of Birth August 25, 1956   CSN [id number]   Medical Record Number  [id number]      Age  59 y.o.   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 WHITE OR CAUCASIAN male with a past psychiatric history significant for ? Schizophrenia, Alcohol use d/o, cocaine use, non compliance, who presents at this time with complaints of (and/or evidence of) the following emotional symptoms: depression, suicidal thoughts/threats, anxiety and psychosis. Additional symptomatology include increased irritability. The above symptoms have  been present for few days. These symptoms are of moderate severity. These symptoms are constant in nature. The patient's condition has been precipitated by and psychosocial stressors (living alone ). Patient's condition made worse by continued illicit drug use and alcohol use as well as treatment noncompliance. UDS + cocaine; BAL= 292 Pt was seen with the team. He was anxious, disheveled, fixated on the ? cancerous growth on his nose. He believes that it is causing him trouble with his eyes. Mostly logical and linear. Denies any withdrawal sx. Denies any SI or HI. Currently not in any outpatient psych Rx. Reports that his PCP is Dr. Doyle Askew.      SOCIAL HISTORY:    Social History     Social History   ??? Marital status: DIVORCED     Spouse name: N/A   ??? Number of children: 0   ??? Years of education: N/A  Occupational History   ??? disability      Social History Main Topics   ??? Smoking status: Current Every Day Smoker     Packs/day: 1.50     Years: 29.00   ??? Smokeless tobacco: Never Used   ??? Alcohol use 0.0 oz/week      Comment: unable to elaborate   ??? Drug use: Yes     Special: Marijuana, Cocaine   ??? Sexual activity: Not Currently     Other Topics Concern   ??? Not on file     Social History Narrative    Lives alone.       FAMILY HISTORY:   Family History   Problem Relation Age of Onset   ??? Heart Disease Mother    ??? Heart Disease Father    ??? Cancer Brother      THROAT             HOSPITALIZATION COURSE:    Deion Swift was admitted to the inpatient psychiatric unit [facility] for acute psychiatric stabilization in regards to symptomatology as described in the HPI above. The differential diagnosis at time of admission included: SIMD vs: Depressive d/o.  While on the unit Tarance Balan was involved in individual, group, occupational and milieu therapy.  Psychiatric medications were adjusted during this hospitalization including antidepressant.   Kennieth Francois demonstrated a  slow, but progressive improvement in overall condition.  Much of patient's depression appeared to be related to situational stressors, effects of drugs of abuse, and psychological factors.  Please see individual progress notes for more specific details regarding patient's hospitalization course.   At time of discharge, Odysseus Cada is without significant problems of depression, psychosis or mania. Patient free of suicidal and homicidal ideations (appears to be at very low risk of suicide or homicide) and reports many positive predictive factors in terms of not attempting suicide or homicide. 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  All members of the treatment team concur with each other in regards to plans for discharge today per patient's request.  Patient and family are aware and in agreement with discharge and discharge plan.    Per my last note: Pt reports to be doing better. Compliant. No drug/alcohol withdrawals. Denies any SI or HI.         LABS AND IMAGAING:    Labs Reviewed   CBC WITH AUTOMATED DIFF - Abnormal; Notable for the following:        Result Value    RDW 15.0 (*)     PLATELET 69 (*)     All other components within normal limits   METABOLIC PANEL, COMPREHENSIVE - Abnormal; Notable for the following:     Glucose 138 (*)     BUN 5 (*)     BUN/Creatinine ratio 7 (*)     Bilirubin, total 2.6 (*)     AST 80 (*)     Protein, total 8.9 (*)     Albumin 3.4 (*)     Globulin 5.5 (*)     A-G Ratio 0.6 (*)     All other components within normal limits   ETHYL ALCOHOL - Abnormal; Notable for the following:     ALCOHOL(ETHYL),SERUM 292 (*)     All other components within normal limits   DRUG SCREEN, URINE - Abnormal; Notable for the following:  COCAINE POSITIVE (*)     All other components within normal limits    URINALYSIS W/ REFLEX CULTURE   GLUCOSE, FASTING   TSH 3RD GENERATION   LIPID PANEL   CBC WITH AUTOMATED DIFF     No results found for: DS35, PHEN, PHENO, PHENT, DILF, DS39, PHENY, PTN, VALF2, VALAC, VALP, VALPR, DS6, CRBAM, CRBAMP, CARB2, XCRBAM  Admission on 07/23/2015   Component Date Value Ref Range Status   ??? WBC 07/23/2015 6.8  4.1 - 11.1 K/uL Final   ??? RBC 07/23/2015 4.40  4.10 - 5.70 M/uL Final   ??? HGB 07/23/2015 14.4  12.1 - 17.0 g/dL Final   ??? HCT 07/23/2015 41.5  36.6 - 50.3 % Final   ??? MCV 07/23/2015 94.3  80.0 - 99.0 FL Final   ??? MCH 07/23/2015 32.7  26.0 - 34.0 PG Final   ??? MCHC 07/23/2015 34.7  30.0 - 36.5 g/dL Final   ??? RDW 07/23/2015 15.0* 11.5 - 14.5 % Final   ??? PLATELET 07/23/2015 69* 150 - 400 K/uL Final   ??? NEUTROPHILS 07/23/2015 58  32 - 75 % Final   ??? LYMPHOCYTES 07/23/2015 31  12 - 49 % Final   ??? MONOCYTES 07/23/2015 10  5 - 13 % Final   ??? EOSINOPHILS 07/23/2015 1  0 - 7 % Final   ??? BASOPHILS 07/23/2015 0  0 - 1 % Final   ??? ABS. NEUTROPHILS 07/23/2015 3.9  1.8 - 8.0 K/UL Final   ??? ABS. LYMPHOCYTES 07/23/2015 2.1  0.8 - 3.5 K/UL Final   ??? ABS. MONOCYTES 07/23/2015 0.7  0.0 - 1.0 K/UL Final   ??? ABS. EOSINOPHILS 07/23/2015 0.1  0.0 - 0.4 K/UL Final   ??? ABS. BASOPHILS 07/23/2015 0.0  0.0 - 0.1 K/UL Final   ??? Sodium 07/23/2015 139  136 - 145 mmol/L Final   ??? Potassium 07/23/2015 4.3  3.5 - 5.1 mmol/L Final   ??? Chloride 07/23/2015 103  97 - 108 mmol/L Final   ??? CO2 07/23/2015 22  21 - 32 mmol/L Final   ??? Anion gap 07/23/2015 14  5 - 15 mmol/L Final   ??? Glucose 07/23/2015 138* 65 - 100 mg/dL Final   ??? BUN 07/23/2015 5* 6 - 20 MG/DL Final   ??? Creatinine 07/23/2015 0.75  0.70 - 1.30 MG/DL Final   ??? BUN/Creatinine ratio 07/23/2015 7* 12 - 20   Final   ??? GFR est AA 07/23/2015 >60  >60 ml/min/1.53m Final   ??? GFR est non-AA 07/23/2015 >60  >60 ml/min/1.712mFinal   ??? Calcium 07/23/2015 8.9  8.5 - 10.1 MG/DL Final   ??? Bilirubin, total 07/23/2015 2.6* 0.2 - 1.0 MG/DL Final    ??? ALT 07/23/2015 53  12 - 78 U/L Final   ??? AST 07/23/2015 80* 15 - 37 U/L Final   ??? Alk. phosphatase 07/23/2015 91  45 - 117 U/L Final   ??? Protein, total 07/23/2015 8.9* 6.4 - 8.2 g/dL Final   ??? Albumin 07/23/2015 3.4* 3.5 - 5.0 g/dL Final   ??? Globulin 07/23/2015 5.5* 2.0 - 4.0 g/dL Final   ??? A-G Ratio 07/23/2015 0.6* 1.1 - 2.2   Final   ??? ALCOHOL(ETHYL),SERUM 07/23/2015 292* <10 MG/DL Final   ??? AMPHETAMINE 07/23/2015 NEGATIVE   NEG   Final   ??? BARBITURATES 07/23/2015 NEGATIVE   NEG   Final   ??? BENZODIAZEPINE 07/23/2015 NEGATIVE   NEG   Final   ??? COCAINE 07/23/2015 POSITIVE* NEG  Final   ??? METHADONE 07/23/2015 NEGATIVE   NEG   Final   ??? OPIATES 07/23/2015 NEGATIVE   NEG   Final   ??? PCP(PHENCYCLIDINE) 07/23/2015 NEGATIVE   NEG   Final   ??? THC (TH-CANNABINOL) 07/23/2015 NEGATIVE   NEG   Final   ??? Drug screen comment 07/23/2015 (NOTE)   Final   ??? Color 07/23/2015 YELLOW/STRAW    Final   ??? Appearance 07/23/2015 CLEAR  CLEAR   Final   ??? Specific gravity 07/23/2015 <1.005  1.003 - 1.030 Final   ??? pH (UA) 07/23/2015 5.5  5.0 - 8.0   Final   ??? Protein 07/23/2015 NEGATIVE   NEG mg/dL Final   ??? Glucose 07/23/2015 NEGATIVE   NEG mg/dL Final   ??? Ketone 07/23/2015 NEGATIVE   NEG mg/dL Final   ??? Bilirubin 07/23/2015 NEGATIVE   NEG   Final   ??? Blood 07/23/2015 NEGATIVE   NEG   Final   ??? Urobilinogen 07/23/2015 1.0  0.2 - 1.0 EU/dL Final   ??? Nitrites 07/23/2015 NEGATIVE   NEG   Final   ??? Leukocyte Esterase 07/23/2015 NEGATIVE   NEG   Final   ??? WBC 07/23/2015 0-4  0 - 4 /hpf Final   ??? RBC 07/23/2015 0-5  0 - 5 /hpf Final   ??? Epithelial cells 07/23/2015 FEW  FEW /lpf Final   ??? Bacteria 07/23/2015 NEGATIVE   NEG /hpf Final   ??? UA:UC IF INDICATED 07/23/2015 CULTURE NOT INDICATED BY UA RESULT  CNI   Final   ??? Glucose 07/24/2015 87  65 - 100 MG/DL Final   ??? TSH 07/24/2015 1.91  0.36 - 3.74 uIU/mL Final   ??? LIPID PROFILE 07/24/2015        Final   ??? Cholesterol, total 07/24/2015 143  <200 MG/DL Final    ??? Triglyceride 07/24/2015 129  <150 MG/DL Final   ??? HDL Cholesterol 07/24/2015 34  MG/DL Final   ??? LDL, calculated 07/24/2015 83.2  0 - 100 MG/DL Final   ??? VLDL, calculated 07/24/2015 25.8  MG/DL Final   ??? CHOL/HDL Ratio 07/24/2015 4.2  0 - 5.0   Final     No results found.                DISPOSITION:    Home. Patient to f/u with drug/etoh rehabilitation, psychiatric, and psychotherapy appointments. Patient is to f/u with internist as directed.  Patient should have a depakote level and associated labs checked within the next 1-2 weeks by patient's o/p psychiatrist/internist.               FOLLOW-UP CARE:    Activity as tolerated  Regular Diet  Wound Care: none needed.  Follow-up Information     Follow up With Details Pole Ojea, MD   [address]    [place] [address]  [phone]                   PROGNOSIS:    Fair---- based on nature of patient's pathology/ies and treatment compliance issues.  Prognosis is greatly dependent upon patient's ability to remain sober and to follow up with drug/etoh rehabilitation and psychiatric/psychotherapy appointments as well as to comply with psychiatric medications as prescribed.            DISCHARGE MEDICATIONS: (changes made).    Informed consent given for the use of following psychotropic medications:  Current Discharge Medication List      START taking these medications    Details   FLUoxetine (PROZAC) 20 mg capsule Take 1 Cap by mouth daily for 30 days. Indications: Depressive d/o  Qty: 30 Cap, Refills: 0      gabapentin (NEURONTIN) 300 mg capsule Take 1 Cap by mouth three (3) times daily for 30 days. Indications: NEUROPATHIC PAIN  Qty: 90 Cap, Refills: 0      folic acid (FOLVITE) 1 mg tablet Take 1 Tab by mouth daily for 30 days. Indications: FOLATE DEFICIENCY  Qty: 30 Tab, Refills: 0      multivitamin, tx-iron-ca-min (THERA-M W/ IRON) 9 mg iron-400 mcg tab  tablet Take 1 Tab by mouth daily for 30 days. Indications: VITAMIN DEFICIENCY  Qty: 30 Tab, Refills: 0      thiamine (B-1) 100 mg tablet Take 1 Tab by mouth daily for 30 days. Indications: THIAMINE DEFICIENCY  Qty: 30 Tab, Refills: 0         CONTINUE these medications which have NOT CHANGED    Details   loratadine (CLARITIN) 10 mg tablet Take 1 Tab by mouth daily.  Qty: 14 Tab, Refills: 0      albuterol (PROVENTIL, VENTOLIN) 90 mcg/Actuation inhaler Take 2 Puffs by inhalation every four (4) hours as needed. Verified through [place] taking these medications       guaiFENesin-codeine (ROBITUSSIN AC) 100-10 mg/5 mL solution Comments:   Reason for Stopping:                      A coordinated, multidisplinary treatment team round was conducted with Tagg Eustice is done daily here at [facility]. This team consists of the nurse, psychiatric unit pharmcist, Catering manager.     I have spent greater than 35 minutes on discharge work.    Signed:  Johnanna Schneiders, MD  07/26/2015

## 2015-08-16 ENCOUNTER — Encounter: Attending: Geriatric Psychiatry | Primary: Internal Medicine

## 2015-11-18 ENCOUNTER — Inpatient Hospital Stay
Admit: 2015-11-18 | Discharge: 2015-11-19 | Disposition: A | Discharge status: Home / Self Care | Payer: MEDICAID | Attending: Emergency Medicine

## 2015-11-18 DIAGNOSIS — F10129 Alcohol abuse with intoxication, unspecified: Secondary | ICD-10-CM

## 2015-11-18 LAB — URINALYSIS W/ REFLEX CULTURE
Bacteria: NEGATIVE /hpf
Bilirubin: NEGATIVE
Blood: NEGATIVE
Glucose: NEGATIVE mg/dL
Ketone: NEGATIVE mg/dL
Leukocyte Esterase: NEGATIVE
Nitrites: NEGATIVE
Protein: NEGATIVE mg/dL
Specific gravity: <1.005 (ref 1.003–1.030)
Urobilinogen: 1 EU/dL (ref 0.2–1.0)
pH (UA): 5.5 (ref 5.0–8.0)

## 2015-11-18 LAB — METABOLIC PANEL, COMPREHENSIVE
A-G Ratio: 0.7 — ABNORMAL LOW (ref 1.1–2.2)
ALT (SGPT): 84 U/L — ABNORMAL HIGH (ref 12–78)
AST (SGOT): 176 U/L — ABNORMAL HIGH (ref 15–37)
Albumin: 3.4 g/dL — ABNORMAL LOW (ref 3.5–5.0)
Alk. phosphatase: 107 U/L (ref 45–117)
Anion gap: 11 mmol/L (ref 5–15)
BUN/Creatinine ratio: 8 — ABNORMAL LOW (ref 12–20)
BUN: 7 MG/DL (ref 6–20)
Bilirubin, total: 1.9 MG/DL — ABNORMAL HIGH (ref 0.2–1.0)
CO2: 25 mmol/L (ref 21–32)
Calcium: 8.3 MG/DL — ABNORMAL LOW (ref 8.5–10.1)
Chloride: 107 mmol/L (ref 97–108)
Creatinine: 0.89 MG/DL (ref 0.70–1.30)
GFR est AA: >60 mL/min/{1.73_m2} (ref >60)
GFR est non-AA: >60 mL/min/{1.73_m2} (ref >60)
Globulin: 5 g/dL — ABNORMAL HIGH (ref 2.0–4.0)
Glucose: 106 mg/dL — ABNORMAL HIGH (ref 65–100)
Potassium: 4.2 mmol/L (ref 3.5–5.1)
Protein, total: 8.4 g/dL — ABNORMAL HIGH (ref 6.4–8.2)
Sodium: 143 mmol/L (ref 136–145)

## 2015-11-18 LAB — CBC WITH AUTOMATED DIFF
ABS. BASOPHILS: 0 10*3/uL (ref 0.0–0.1)
ABS. EOSINOPHILS: 0.2 10*3/uL (ref 0.0–0.4)
ABS. LYMPHOCYTES: 2.4 10*3/uL (ref 0.8–3.5)
ABS. MONOCYTES: 0.4 10*3/uL (ref 0.0–1.0)
ABS. NEUTROPHILS: 2.5 10*3/uL (ref 1.8–8.0)
BASOPHILS: 1 % (ref 0–1)
EOSINOPHILS: 4 % (ref 0–7)
HCT: 39.2 % (ref 36.6–50.3)
HGB: 14 g/dL (ref 12.1–17.0)
LYMPHOCYTES: 44 % (ref 12–49)
MCH: 32 PG (ref 26.0–34.0)
MCHC: 35.7 g/dL (ref 30.0–36.5)
MCV: 89.7 FL (ref 80.0–99.0)
MONOCYTES: 8 % (ref 5–13)
NEUTROPHILS: 43 % (ref 32–75)
PLATELET: 96 10*3/uL — ABNORMAL LOW (ref 150–400)
RBC: 4.37 M/uL (ref 4.10–5.70)
RDW: 15.7 % — ABNORMAL HIGH (ref 11.5–14.5)
WBC: 5.5 10*3/uL (ref 4.1–11.1)

## 2015-11-18 LAB — ETHYL ALCOHOL: ALCOHOL(ETHYL),SERUM: 304 MG/DL — ABNORMAL HIGH (ref <10)

## 2015-11-18 LAB — DRUG SCREEN, URINE
AMPHETAMINES: NEGATIVE
BARBITURATES: NEGATIVE
BENZODIAZEPINES: NEGATIVE
COCAINE: NEGATIVE
METHADONE: NEGATIVE
OPIATES: NEGATIVE
PCP(PHENCYCLIDINE): NEGATIVE
THC (TH-CANNABINOL): NEGATIVE

## 2015-11-18 LAB — MAGNESIUM: Magnesium: 1.8 mg/dL (ref 1.6–2.4)

## 2015-11-18 MED ORDER — THIAMINE 100 MG/ML INJECTION
100 mg/mL | INTRAMUSCULAR | Status: AC
Start: 2015-11-18 — End: 2015-11-18
  Administered 2015-11-18: 23:00:00 via INTRAVENOUS

## 2015-11-18 MED ORDER — IPRATROPIUM-ALBUTEROL 2.5 MG-0.5 MG/3 ML NEB SOLUTION
2.5 mg-0.5 mg/3 ml | RESPIRATORY_TRACT | Status: AC
Start: 2015-11-18 — End: 2015-11-18
  Administered 2015-11-18: 23:00:00 via RESPIRATORY_TRACT

## 2015-11-18 MED ORDER — IPRATROPIUM-ALBUTEROL 2.5 MG-0.5 MG/3 ML NEB SOLUTION
2.5 mg-0.5 mg/3 ml | Freq: Four times a day (QID) | RESPIRATORY_TRACT | Status: DC
Start: 2015-11-18 — End: 2015-11-18

## 2015-11-18 MED FILL — SODIUM CHLORIDE 0.9 % IV: INTRAVENOUS | Qty: 1000

## 2015-11-18 MED FILL — IPRATROPIUM-ALBUTEROL 2.5 MG-0.5 MG/3 ML NEB SOLUTION: 2.5 mg-0.5 mg/3 ml | RESPIRATORY_TRACT | Qty: 3
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
## 2015-11-18 NOTE — ED Notes (Signed)
Patient given bus ticket at this time. Patient refused discharge instructions at this time. Refused discharge vital signs, ambulatory out of ED with steady gait at this time.

## 2015-11-18 NOTE — ED Notes (Signed)
Bedside and Verbal shift change report given to Amanda RN (oncoming nurse) by Eucelina RN (offgoing nurse). Report included the following information SBAR, ED Summary, Procedure Summary and MAR.

## 2015-11-18 NOTE — ED Notes (Signed)
Patient standing in room yelling " I want to go home". Provider aware, Sherriff deputy at bedside. Awaiting dishcharge paperwork

## 2016-05-02 ENCOUNTER — Emergency Department: Admit: 2016-05-03 | Payer: MEDICAID | Primary: Internal Medicine

## 2016-05-02 DIAGNOSIS — F10229 Alcohol dependence with intoxication, unspecified: Principal | ICD-10-CM

## 2016-05-02 NOTE — ED Notes (Signed)
.  See Nursing Assessment      Emergency Department Nursing Plan of Care       The Nursing Plan of Care is developed from the Nursing assessment and Emergency Department Attending provider initial evaluation.  The plan of care may be reviewed in the ???ED Provider note???.    The Plan of Care was developed with the following considerations:   Patient / Family readiness to learn indicated ZO:[id number]:verbalized understanding  Persons(s) to be included in education: patient  Barriers to Learning/Limitations:No    Signed     Baldo Ashiffany G Jackson, RN    05/02/2016   9:34 PM

## 2016-05-02 NOTE — ED Notes (Signed)
Patient was placed on 2 liters of oxygen via nasal canula. Patients oxygen is 88% on room air. When placed on 2 liters patients oxygen increased to 95% on 2 liters.
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
## 2016-05-02 NOTE — ED Notes (Signed)
Patient was provided a phone in the room, he is wanting to call his girlfriend first then his wife. He was told not to have them both coming to the ED fighting over him. The patient began to laugh & reported that they know about each other & he sleeps in the middle.

## 2016-05-02 NOTE — ED Notes (Signed)
Patient was provided water to drink.

## 2016-05-02 NOTE — ED Notes (Signed)
Patients girlfriend is in the room with him at this time. She reports that she has not seen the patient x 8 days, she reports that he has not been home. She reports that the patient is not eating & only drinking alcohol, knowing that he has liver cancer. The patient was asked if he had liver cancer the patient stated "yes". The patient reports that he is supposed to take Inferon Injection medication but has not had it x 2 months. The patients girlfriend reports that he had no permanent home. The patients girlfriend reports him recently being released from jail & was sober without any problems but is now drinking daily again. She states "I think he's trying to kill hisself". Patient denies being suicidal or homicidal.

## 2016-05-02 NOTE — ED Notes (Signed)
Patient is sleeping in the bed at this time.

## 2016-05-02 NOTE — ED Notes (Signed)
Patient reports getting in contact with his girlfriend, he reports that she is on her way down here. The patient was asked not to call his wife. The patient laughed.

## 2016-05-02 NOTE — ED Notes (Signed)
Verbal shift change report given to Cordelia PenSherry RN (oncoming nurse) by Cristal Generousiffany RN (offgoing nurse). Report included the following information SBAR, Kardex, ED Summary, Procedure Summary, MAR and Recent Results.
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
## 2016-05-03 ENCOUNTER — Inpatient Hospital Stay

## 2016-05-03 ENCOUNTER — Inpatient Hospital Stay
Admit: 2016-05-03 | Discharge: 2016-05-03 | Disposition: A | Discharge status: Home / Self Care | Payer: MEDICAID | Attending: Internal Medicine | Admitting: Internal Medicine

## 2016-05-03 LAB — CBC WITH AUTOMATED DIFF
ABS. BASOPHILS: 0 10*3/uL (ref 0.0–0.1)
ABS. EOSINOPHILS: 0.1 10*3/uL (ref 0.0–0.4)
ABS. LYMPHOCYTES: 2.4 10*3/uL (ref 0.8–3.5)
ABS. MONOCYTES: 0.5 10*3/uL (ref 0.0–1.0)
ABS. NEUTROPHILS: 1.9 10*3/uL (ref 1.8–8.0)
BASOPHILS: 0 % (ref 0–1)
EOSINOPHILS: 3 % (ref 0–7)
HCT: 38.5 % (ref 36.6–50.3)
HGB: 13.5 g/dL (ref 12.1–17.0)
LYMPHOCYTES: 48 % (ref 12–49)
MCH: 31.8 PG (ref 26.0–34.0)
MCHC: 35.1 g/dL (ref 30.0–36.5)
MCV: 90.8 FL (ref 80.0–99.0)
MONOCYTES: 10 % (ref 5–13)
NEUTROPHILS: 39 % (ref 32–75)
PLATELET: 69 10*3/uL — ABNORMAL LOW (ref 150–400)
RBC: 4.24 M/uL (ref 4.10–5.70)
RDW: 16 % — ABNORMAL HIGH (ref 11.5–14.5)
WBC: 4.9 10*3/uL (ref 4.1–11.1)

## 2016-05-03 LAB — METABOLIC PANEL, COMPREHENSIVE
A-G Ratio: 0.7 — ABNORMAL LOW (ref 1.1–2.2)
ALT (SGPT): 40 U/L (ref 12–78)
AST (SGOT): 113 U/L — ABNORMAL HIGH (ref 15–37)
Albumin: 2.9 g/dL — ABNORMAL LOW (ref 3.5–5.0)
Alk. phosphatase: 174 U/L — ABNORMAL HIGH (ref 45–117)
Anion gap: 5 mmol/L (ref 5–15)
BUN/Creatinine ratio: 7 — ABNORMAL LOW (ref 12–20)
BUN: 6 MG/DL (ref 6–20)
Bilirubin, total: 2.4 MG/DL — ABNORMAL HIGH (ref 0.2–1.0)
CO2: 30 mmol/L (ref 21–32)
Calcium: 7.5 MG/DL — ABNORMAL LOW (ref 8.5–10.1)
Chloride: 108 mmol/L (ref 97–108)
Creatinine: 0.91 MG/DL (ref 0.70–1.30)
GFR est AA: >60 mL/min/{1.73_m2} (ref >60)
GFR est non-AA: >60 mL/min/{1.73_m2} (ref >60)
Globulin: 4.4 g/dL — ABNORMAL HIGH (ref 2.0–4.0)
Glucose: 83 mg/dL (ref 65–100)
Potassium: 3.4 mmol/L — ABNORMAL LOW (ref 3.5–5.1)
Protein, total: 7.3 g/dL (ref 6.4–8.2)
Sodium: 143 mmol/L (ref 136–145)

## 2016-05-03 LAB — URINALYSIS W/ REFLEX CULTURE
Bacteria: NEGATIVE /hpf
Bilirubin: NEGATIVE
Glucose: NEGATIVE mg/dL
Ketone: NEGATIVE mg/dL
Leukocyte Esterase: NEGATIVE
Nitrites: NEGATIVE
Protein: NEGATIVE mg/dL
Specific gravity: 1.01 (ref 1.003–1.030)
Urobilinogen: 2 EU/dL — ABNORMAL HIGH (ref 0.2–1.0)
pH (UA): 6 (ref 5.0–8.0)

## 2016-05-03 LAB — ETHYL ALCOHOL: ALCOHOL(ETHYL),SERUM: 283 MG/DL — ABNORMAL HIGH (ref <10)

## 2016-05-03 LAB — DRUG SCREEN, URINE
AMPHETAMINES: NEGATIVE
BARBITURATES: NEGATIVE
BENZODIAZEPINES: POSITIVE — AB
COCAINE: POSITIVE — AB
METHADONE: NEGATIVE
OPIATES: NEGATIVE
PCP(PHENCYCLIDINE): NEGATIVE
THC (TH-CANNABINOL): NEGATIVE

## 2016-05-03 LAB — LIPASE: Lipase: 294 U/L (ref 73–393)

## 2016-05-03 LAB — AMMONIA
Ammonia, plasma: 62 umol/L — ABNORMAL HIGH (ref <32)
Ammonia, plasma: 55 umol/L — ABNORMAL HIGH (ref <32)

## 2016-05-03 LAB — GLUCOSE, POC
Glucose (POC): 84 mg/dL (ref 65–100)
Glucose (POC): 110 mg/dL — ABNORMAL HIGH (ref 65–100)

## 2016-05-03 LAB — PTT: aPTT: 29.4 s (ref 22.1–32.5)

## 2016-05-03 LAB — PROTHROMBIN TIME + INR
INR: 1.6 — ABNORMAL HIGH (ref 0.9–1.1)
Prothrombin time: 15.7 s — ABNORMAL HIGH (ref 9.0–11.1)

## 2016-05-03 MED ORDER — SODIUM CHLORIDE 0.9 % IV
INTRAVENOUS | Status: DC
Start: 2016-05-03 — End: 2016-05-03
  Administered 2016-05-03: 14:00:00 via INTRAVENOUS

## 2016-05-03 MED ORDER — SODIUM CHLORIDE 0.9 % IJ SYRG
INTRAMUSCULAR | Status: DC | PRN
Start: 2016-05-03 — End: 2016-05-03

## 2016-05-03 MED ORDER — DIPHENHYDRAMINE HCL 50 MG/ML IJ SOLN
50 mg/mL | INTRAMUSCULAR | Status: DC | PRN
Start: 2016-05-03 — End: 2016-05-03

## 2016-05-03 MED ORDER — NALOXONE 0.4 MG/ML INJECTION
0.4 mg/mL | INTRAMUSCULAR | Status: DC | PRN
Start: 2016-05-03 — End: 2016-05-03

## 2016-05-03 MED ORDER — ACETAMINOPHEN 325 MG TABLET
325 mg | ORAL | Status: DC | PRN
Start: 2016-05-03 — End: 2016-05-03

## 2016-05-03 MED ORDER — HYDROCODONE-ACETAMINOPHEN 5 MG-325 MG TAB
5-325 mg | ORAL | Status: DC | PRN
Start: 2016-05-03 — End: 2016-05-03

## 2016-05-03 MED ORDER — DOCUSATE SODIUM 100 MG CAP
100 mg | Freq: Two times a day (BID) | ORAL | Status: DC
Start: 2016-05-03 — End: 2016-05-03
  Administered 2016-05-03: 13:00:00 via ORAL

## 2016-05-03 MED ORDER — LACTULOSE 10 GRAM/15 ML ORAL SOLUTION
10 gram/15 mL | Freq: Three times a day (TID) | ORAL | Status: DC
Start: 2016-05-03 — End: 2016-05-03
  Administered 2016-05-03: 13:00:00 via ORAL

## 2016-05-03 MED ORDER — LORATADINE 10 MG TAB
10 mg | Freq: Every day | ORAL | Status: DC
Start: 2016-05-03 — End: 2016-05-03
  Administered 2016-05-03: 13:00:00 via ORAL

## 2016-05-03 MED ORDER — LACTULOSE 10 GRAM/15 ML ORAL SOLUTION
10 gram/15 mL | ORAL | Status: AC
Start: 2016-05-03 — End: 2016-05-03
  Administered 2016-05-03: 04:00:00 via ORAL

## 2016-05-03 MED ORDER — LORAZEPAM 2 MG/ML IJ SOLN
2 mg/mL | INTRAMUSCULAR | Status: DC | PRN
Start: 2016-05-03 — End: 2016-05-03

## 2016-05-03 MED ORDER — SODIUM CHLORIDE 0.9 % IJ SYRG
Freq: Three times a day (TID) | INTRAMUSCULAR | Status: DC
Start: 2016-05-03 — End: 2016-05-03
  Administered 2016-05-03 (×2): via INTRAVENOUS

## 2016-05-03 MED ORDER — THERAPEUTIC MULTIVITAMIN TAB
ORAL | Status: AC
Start: 2016-05-03 — End: 2016-05-03
  Administered 2016-05-03: 04:00:00 via ORAL

## 2016-05-03 MED ORDER — ENOXAPARIN 40 MG/0.4 ML SUB-Q SYRINGE
40 mg/0.4 mL | SUBCUTANEOUS | Status: DC
Start: 2016-05-03 — End: 2016-05-03
  Administered 2016-05-03: 07:00:00 via SUBCUTANEOUS

## 2016-05-03 MED ORDER — FOLIC ACID 5 MG/ML IJ SOLN
5 mg/mL | INTRAMUSCULAR | Status: DC
Start: 2016-05-03 — End: 2016-05-03
  Administered 2016-05-03: 05:00:00 via INTRAVENOUS

## 2016-05-03 MED ORDER — SALINE PERIPHERAL FLUSH PRN
INTRAMUSCULAR | Status: DC | PRN
Start: 2016-05-03 — End: 2016-05-03

## 2016-05-03 MED ORDER — ONDANSETRON (PF) 4 MG/2 ML INJECTION
4 mg/2 mL | INTRAMUSCULAR | Status: DC | PRN
Start: 2016-05-03 — End: 2016-05-03

## 2016-05-03 MED ORDER — IPRATROPIUM-ALBUTEROL 2.5 MG-0.5 MG/3 ML NEB SOLUTION
2.5 mg-0.5 mg/3 ml | RESPIRATORY_TRACT | Status: AC
Start: 2016-05-03 — End: 2016-05-02
  Administered 2016-05-03: 02:00:00 via RESPIRATORY_TRACT

## 2016-05-03 MED FILL — LORATADINE 10 MG TAB: 10 mg | ORAL | Qty: 1

## 2016-05-03 MED FILL — LACTULOSE 20 GRAM/30 ML ORAL SOLUTION: 20 gram/30 mL | ORAL | Qty: 30

## 2016-05-03 MED FILL — THERAPEUTIC MULTIVITAMIN TAB: ORAL | Qty: 1

## 2016-05-03 MED FILL — DOK 100 MG CAPSULE: 100 mg | ORAL | Qty: 1

## 2016-05-03 MED FILL — SODIUM CHLORIDE 0.9 % IV: INTRAVENOUS | Qty: 1000

## 2016-05-03 MED FILL — BD POSIFLUSH NORMAL SALINE 0.9 % INJECTION SYRINGE: INTRAMUSCULAR | Qty: 10

## 2016-05-03 MED FILL — ENOXAPARIN 40 MG/0.4 ML SUB-Q SYRINGE: 40 mg/0.4 mL | SUBCUTANEOUS | Qty: 0.4

## 2016-05-03 MED FILL — IPRATROPIUM-ALBUTEROL 2.5 MG-0.5 MG/3 ML NEB SOLUTION: 2.5 mg-0.5 mg/3 ml | RESPIRATORY_TRACT | Qty: 3

## 2016-05-03 NOTE — Discharge Summary (Signed)
Discharge Summary by Raechel ChuteYaqub, Francenia Chimenti, Ferguson at 05/03/16 1415                Author: Raechel ChuteYaqub, Kayson Bullis, Ferguson  Service: Internal Medicine  Author Type: Physician       Filed: 05/08/16 0652  Date of Service: 05/03/16 1415  Status: Addendum          Editor: Raechel ChuteYaqub, Markita Stcharles, Ferguson (Physician)          Related Notes: Original Note by Raechel ChuteYaqub, Zackariah Vanderpol, Ferguson (Physician) filed at 05/08/16 [phone]                                       Hospitalist Discharge Summary        Patient ID:   Eugene Ferguson   [id number]   60 y.o.   11/11/1956      PCP on record: Eugene Gaveichard A Jackson, Ferguson      Admit date: 05/02/2016   Discharge date and time: 05/03/2016         DISCHARGE DIAGNOSIS:      ??   Decreased alertness: POA   Polysubstance abuse, cocaine/benzo/alcohol/tobacco   Liver cirrhosis   Hepatitis C   alcohol abuse   Alcoholic hepatitis         CONSULTATIONS:   None      Excerpted HPI from H&P of Eugene EeJayne H Lee, Ferguson:   Admitted overnight by tele hospitalist and seen by me and examined this morning.    Eugene CooterMichael Ferguson is a 60 y.o.  male with h/o asthma, liver CA, GERD, schizophrenia, Liver cirrhosis, hepatitis C, alcohol abuse and illicit drug abuse admitted for decreased alertness. He is still sleepy this morning but able to provide history. Says  he does not remember what happened yesterday but admits to doing cocaine and alcohol and do not know where the benzodiazepine is coming from.  Denies fever, chills, cough or sick contacts or recent travel.  Upon evaluation of the er, he had alcohol  levels in 200's and utox with cocaine and benzos, CT head was negative. His ammonia level was also elevated 62.   ??   We were asked to admit for work up and evaluation of the above problems      ______________________________________________________________________   DISCHARGE SUMMARY/HOSPITAL COURSE:   for full details see H&P, daily progress notes, labs, consult notes.          ??   Decreased alertness: POA   Doubt it was hepatic encephalopathy, likely secondary to  polysubstance abuse and alcohol use   -pt is more awake and conversant this morning, tolerated po diet   -tolerate ing po, ambulatory without assistance, MS back to abseline   ??   Polysubstance abuse, cocaine/benzo/alcohol/tobacco   Counseled to quit, in setting of liver cirrhosis and CA, pt still using drugs and alcohol, not interested in life style change   ??   Liver cirrhosis   Hepatitis C   alcohol abuse   Alcoholic hepatitis   -hepatology follow up advised   ??   Code Status: full    Stable and will discharge this afternoon   ??         _______________________________________________________________________   Patient seen and examined by me on discharge day.   Pertinent Findings:   As per HPI   _______________________________________________________________________   DISCHARGE MEDICATIONS:      Discharge Medication List as of 05/03/2016  1:43 PM  CONTINUE these medications which have NOT CHANGED          Details        loratadine (CLARITIN) 10 mg tablet  Take 1 Tab by mouth daily., Print, Disp-14 Tab, R-0               albuterol (PROVENTIL, VENTOLIN) 90 mcg/Actuation inhaler  Take 2 Puffs by inhalation every four (4) hours as needed. Verified through [facility] pharmacy Historical Med, 2 Puff                         My Recommended Diet, Activity, Wound Care, and follow-up labs are listed in the patient's Discharge Insturctions which I have personally completed and reviewed.      _______________________________________________________________________      DISPOSITION:          Home with Family:  x        Home with HH/PT/OT/RN:          SNF/LTC:          SA[facility]:          OTHER:             Condition at Discharge:  Stable   _______________________________________________________________________   Follow up with:    PCP : Eugene Ferguson     Follow-up Information        Follow up With  Details  Comments  Contact Info             Eugene Ferguson      [address]      [facility] [place] [address]   [phone]                         Total time in minutes spent coordinating this discharge (includes going over instructions, follow-up, prescriptions, and preparing report for sign off to her PCP) :  30 minutes      Signed:   Raechel Chute, Ferguson

## 2016-05-03 NOTE — Progress Notes (Addendum)
Case open for coordination of services.  Came through the ER . [facility] Work-up in progress.   Patient has Juliann Pulseichard Jackson listed as PCP. Office closed over the weekend and to follow-up on Monday. Patient has [employer] listed as insurance and has medication benefits with 0-min co-payment.   Will provide Substance Abuse Resources.   Ongoing Assessment.      Velvet Mangum-Holmes MSW RN   [phone]    Addendum:  Late entry for  05-03-2016     Patient discharge home to call patient on Monday to assist with follow-up.  Care Management Interventions  Palliative Care Consult (Criteria: CHF and RRAT>21): No  Reason for No Palliative Care Consult: Other (see comment)  Transition of Care Consult (CM Consult): Discharge Planning  Health Maintenance Reviewed: Yes  Discharge Location  Discharge Placement: Home with outpatient services

## 2016-05-03 NOTE — ED Notes (Signed)
Pt noted to be resting comfortably. Pt updated on plan of care, has no questions or concerns at this time.

## 2016-05-03 NOTE — ED Notes (Signed)
TRANSFER - OUT REPORT:    Verbal report given to Neysa BonitoChristy (name) on Girard CooterMichael Paletta  being transferred to Med Surg (unit) for routine progression of care       Report consisted of patient???s Situation, Background, Assessment and   Recommendations(SBAR).     Information from the following report(s) SBAR, ED Summary, Intake/Output, MAR and Recent Results was reviewed with the receiving nurse.    Lines:   Peripheral IV 05/02/16 Left Hand (Active)   Site Assessment Clean, dry, & intact 05/02/2016  9:19 PM   Phlebitis Assessment 0 05/02/2016  9:19 PM   Infiltration Assessment 0 05/02/2016  9:19 PM   Dressing Status Clean, dry, & intact 05/02/2016  9:19 PM   Dressing Type Transparent 05/02/2016  9:19 PM   Hub Color/Line Status Pink;Flushed;Patent 05/02/2016  9:19 PM       Peripheral IV 05/02/16 Right Antecubital (Active)   Site Assessment Clean, dry, & intact 05/02/2016  9:32 PM   Phlebitis Assessment 0 05/02/2016  9:32 PM   Infiltration Assessment 0 05/02/2016  9:32 PM   Dressing Status Clean, dry, & intact 05/02/2016  9:32 PM   Dressing Type Transparent 05/02/2016  9:32 PM   Hub Color/Line Status Flushed 05/02/2016  9:32 PM        Opportunity for questions and clarification was provided.      Patient transported with:   [employer]

## 2016-05-03 NOTE — Discharge Summary (Addendum)
Hospitalist Discharge Summary     Patient ID:  Eugene Ferguson  [id number]  60 y.o.  11/18/1955    PCP on record: Dickey Gaveichard A Jackson, MD    Admit date: 05/02/2016  Discharge date and time: 05/03/2016      DISCHARGE DIAGNOSIS:    ??  Decreased alertness: POA  Polysubstance abuse, cocaine/benzo/alcohol/tobacco  Liver cirrhosis  Hepatitis C  alcohol abuse  Alcoholic hepatitis      CONSULTATIONS:  None    Excerpted HPI from H&P of Jory EeJayne H Lee, MD:  Admitted overnight by tele hospitalist and seen by me and examined this morning.   Eugene Ferguson is a 60 y.o.  male with h/o asthma, liver CA, GERD, schizophrenia, Liver cirrhosis, hepatitis C, alcohol abuse and illicit drug abuse admitted for decreased alertness. He is still sleepy this morning but able to provide history. Says he does not remember what happened yesterday but admits to doing cocaine and alcohol and do not know where the benzodiazepine is coming from.  Denies fever, chills, cough or sick contacts or recent travel.  Upon evaluation of the er, he had alcohol levels in 200's and utox with cocaine and benzos, CT head was negative. His ammonia level was also elevated 62.  ??  We were asked to admit for work up and evaluation of the above problems    ______________________________________________________________________  DISCHARGE SUMMARY/HOSPITAL COURSE:  for full details see H&P, daily progress notes, labs, consult notes.       ??  Decreased alertness: POA  Doubt it was hepatic encephalopathy, likely secondary to polysubstance abuse and alcohol use  -pt is more awake and conversant this morning, tolerated po diet  -tolerate ing po, ambulatory without assistance, MS back to abseline  ??  Polysubstance abuse, cocaine/benzo/alcohol/tobacco  Counseled to quit, in setting of liver cirrhosis and CA, pt still using drugs and alcohol, not interested in life style change  ??  Liver cirrhosis  Hepatitis C  alcohol abuse  Alcoholic hepatitis   -hepatology follow up advised  ??  Code Status: full   Stable and will discharge this afternoon  ??      _______________________________________________________________________  Patient seen and examined by me on discharge day.  Pertinent Findings:  As per HPI  _______________________________________________________________________  DISCHARGE MEDICATIONS:   Discharge Medication List as of 05/03/2016  1:43 PM      CONTINUE these medications which have NOT CHANGED    Details   loratadine (CLARITIN) 10 mg tablet Take 1 Tab by mouth daily., Print, Disp-14 Tab, R-0      albuterol (PROVENTIL, VENTOLIN) 90 mcg/Actuation inhaler Take 2 Puffs by inhalation every four (4) hours as needed. Verified through [facility] Historical Med, 2 Puff             My Recommended Diet, Activity, Wound Care, and follow-up labs are listed in the patient's Discharge Insturctions which I have personally completed and reviewed.    _______________________________________________________________________  DISPOSITION:    Home with Family: x   Home with HH/PT/OT/RN:    SNF/LTC:    SAHR:    OTHER:        Condition at Discharge:  Stable  _______________________________________________________________________  Follow up with:   PCP : Dickey Gaveichard A Jackson, MD  Follow-up Information     Follow up With Details Comments Contact Info    Dickey Gaveichard A Jackson, MD   [address]    [facility]  [place] [id number]  [phone]  Total time in minutes spent coordinating this discharge (includes going over instructions, follow-up, prescriptions, and preparing report for sign off to her PCP) :  30 minutes    Signed:  Raechel Chute, MD

## 2016-05-03 NOTE — Progress Notes (Signed)
0127: TRANSFER - IN REPORT:    Verbal report received from [facility](name) on Eugene CooterMichael Ferguson  being received from ED(unit) for routine progression of care      Report consisted of patient???s Situation, Background, Assessment and   Recommendations(SBAR).     Information from the following report(s) SBAR, Kardex, ED Summary, Intake/Output, MAR and Recent Results was reviewed with the receiving nurse.    Opportunity for questions and clarification was provided.      0143: Pt arrived to floor via w/c. Assessment completed upon patient???s arrival to unit and care assumed.     0150: Pt is A & O to person and place. Pt is drowsy at this time and poor historian. Unable to complete admission database. Dr. Nedra HaiLee aware pt is drowsy and AMS.     [phone]: Bedside shift change report given to Kirt BoysAshley Miller (oncoming nurse) by Neysa Bonitohristy (offgoing nurse). Report included the following information SBAR, Kardex, ED Summary, Intake/Output, MAR and Recent Results.

## 2016-05-03 NOTE — Progress Notes (Addendum)
Received bedside/verbal report from off going nurse Cathleen Cortihristy Seward.    0900- Pt sleepy but aroused easily. Oriented to place only; can state his first name but not his whole name. Bed alarm on and call bell within reach.    1250- Pt sleeping; refused to walk stating he "just wants to sleep." Pt had one bowel movement and up with 1 assistance to bathroom. Gets up on own without asking for assistance; bed alarm on and call bell within reach.    1400- Removed pt's IV catheters with both tips intact and reviewed discharge instructions with pt while providing opportunity for questions. Pt being discharged on new prescriptions; lighter and cigarettes returned to pt from server. Pt said his address is [address] and that he'll walk there.

## 2016-05-03 NOTE — ED Notes (Signed)
Attempted to call report to IP nurse. Per unit: IP nurse will call back when available.
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
## 2016-05-03 NOTE — H&P (Signed)
History & Physical Dictation Template      NAME: Eugene Ferguson   Date of Service  05/03/2016   MRN  [id number]   Date of Birth 14-May-1956   AGE: 60 y.o.   ROOM: 213/01     _0 @    _1 @     _2 @     HPI  The patient Eugene Ferguson is a 60 y.o. male that is admitted with altered mental status.  He has a pmh significant for alcohol abuse, polysubstance abuse, possible schoizophrenia.  He was found by ems and was not able to give a history.  He states that he a few beers today.  He denies any symptoms such as pain or sob.  Upon evaluation of the er, he had alcohol levels in his blood and utox with cocaine and benzos. His ct head was negative.  His ammonia level was also elevated and he received a dose of lactulose.  THE PATIENT WAS SEEN VIA REMOTE PRESENCE WITH RN PRESENT.    ASSESSMENT/ PLAN   1. Hepatic encephelopathy: baseline mental status not known, will cont lactulose, ivfs, neuro checks  2. Alcohol abuse: banana bag daily and will give ativan prn  3. Polysubstance abuse: watch for w/d symptoms  4. 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  Visit Vitals   ??? BP 106/68 (BP 1 Location: Left arm, BP Patient Position: At rest)   ??? Pulse 77   ??? Temp 97.4 ??F (36.3 ??C)   ??? Resp 16   ??? Ht _3  (1.702 m)   ??? Wt 80.3 kg (177 lb)   ??? SpO2 96%   ??? BMI 27.72 kg/m2       Pertinent Physical Exam Findings: patient looks disheveled dirty hands and is mumbling, not able to give any history, but states that he is cold, heart and lungs clear per nurse exam   ________________________________________________________________________  family history includes Cancer in his brother; Heart Disease in his father and mother.  ________________________________________________________________________  Present on Admission:  ??? Non-compliance with treatment  ??? Alcohol dependence ([place])  ??? Tobacco dependence  ??? 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 Cocaine use  ??? Liver disease  ??? Hepatic encephalopathy ([place])    ________________________________________________________________________  ________________________________________________________________________  Recent Results (from the past 24 hour(s))   GLUCOSE, POC    Collection Time: 05/02/16  9:04 PM   Result Value Ref Range    Glucose (POC) 84 65 - 100 mg/dL    Performed by [facility]    ETHYL ALCOHOL    Collection Time: 05/02/16  9:21 PM   Result Value Ref Range    ALCOHOL(ETHYL),SERUM 283 (H) <10 MG/DL   CBC WITH AUTOMATED DIFF    Collection Time: 05/02/16  9:21 PM   Result Value Ref Range    WBC 4.9 4.1 - 11.1 K/uL    RBC  4.24 4.10 - 5.70 M/uL    HGB 13.5 12.1 - 17.0 g/dL    HCT 38.5 36.6 - 50.3 %    MCV 90.8 80.0 - 99.0 FL    MCH 31.8 26.0 - 34.0 PG    MCHC 35.1 30.0 - 36.5 g/dL    RDW 16.0 (H) 11.5 - 14.5 %    PLATELET 69 (L) 150 - 400 K/uL    NEUTROPHILS 39 32 - 75 %    LYMPHOCYTES 48 12 - 49 %    MONOCYTES 10 5 - 13 %    EOSINOPHILS 3 0 - 7 %    BASOPHILS 0 0 - 1 %    ABS. NEUTROPHILS 1.9 1.8 - 8.0 K/UL    ABS. LYMPHOCYTES 2.4 0.8 - 3.5 K/UL    ABS. MONOCYTES 0.5 0.0 - 1.0 K/UL    ABS. EOSINOPHILS 0.1 0.0 - 0.4 K/UL    ABS. BASOPHILS 0.0 0.0 - 0.1 K/UL   METABOLIC PANEL, COMPREHENSIVE    Collection Time: 05/02/16  9:21 PM   Result Value Ref Range    Sodium 143 136 - 145 mmol/L    Potassium 3.4 (L) 3.5 - 5.1 mmol/L    Chloride 108 97 - 108 mmol/L    CO2 30 21 - 32 mmol/L    Anion gap 5 5 - 15 mmol/L    Glucose 83 65 - 100 mg/dL    BUN 6 6 - 20 MG/DL    Creatinine 0.91 0.70 - 1.30 MG/DL    BUN/Creatinine ratio 7 (L) 12 - 20       GFR est AA >60 >60 ml/min/1.62m    GFR est non-AA >60 >60 ml/min/1.781m   Calcium 7.5 (L) 8.5 - 10.1 MG/DL    Bilirubin, total 2.4 (H) 0.2 - 1.0 MG/DL    ALT (SGPT) 40 12 - 78 U/L    AST (SGOT) 113 (H) 15 - 37 U/L    Alk. phosphatase 174 (H) 45 - 117 U/L    Protein, total 7.3 6.4 - 8.2 g/dL    Albumin 2.9 (L) 3.5 - 5.0 g/dL    Globulin 4.4 (H) 2.0 - 4.0 g/dL    A-G Ratio 0.7 (L) 1.1 - 2.2     LIPASE    Collection Time: 05/02/16  9:21 PM   Result Value Ref Range    Lipase 294 73 - 393 U/L   AMMONIA    Collection Time: 05/02/16  9:21 PM   Result Value Ref Range    Ammonia 62 (H) <32 UMOL/L   GLUCOSE, POC    Collection Time: 05/02/16 11:12 PM   Result Value Ref Range    Glucose (POC) 110 (H) 65 - 100 mg/dL    Performed by [facility]    Collection Time: 05/02/16 11:31 PM   Result Value Ref Range    AMPHETAMINE NEGATIVE  NEG      BARBITURATES NEGATIVE  NEG      BENZODIAZEPINE POSITIVE (A) NEG      COCAINE POSITIVE (A) NEG      METHADONE NEGATIVE  NEG      OPIATES NEGATIVE  NEG      PCP(PHENCYCLIDINE) NEGATIVE  NEG      THC (TH-CANNABINOL) NEGATIVE  NEG      Drug screen comment (NOTE)    URINALYSIS W/ REFLEX CULTURE    Collection Time: 05/02/16 11:31 PM   Result Value Ref Range    Color DARK  YELLOW      Appearance CLEAR CLEAR      Specific gravity 1.010 1.003 - 1.030      pH (UA) 6.0 5.0 - 8.0      Protein NEGATIVE  NEG mg/dL    Glucose NEGATIVE  NEG mg/dL    Ketone NEGATIVE  NEG mg/dL    Bilirubin NEGATIVE  NEG      Blood LARGE (A) NEG      Urobilinogen 2.0 (H) 0.2 - 1.0 EU/dL    Nitrites NEGATIVE  NEG      Leukocyte Esterase NEGATIVE  NEG      WBC 0-4 0 - 4 /hpf    RBC 10-20 0 - 5 /hpf    Epithelial cells FEW FEW /lpf    Bacteria NEGATIVE  NEG /hpf    UA:UC IF INDICATED CULTURE NOT INDICATED BY UA RESULT CNI      CA Oxalate crystals FEW (A) NEG     PROTHROMBIN TIME + INR    Collection Time: 05/03/16 12:11 AM   Result Value Ref Range    INR 1.6 (H) 0.9 - 1.1       Prothrombin time 15.7 (H) 9.0 - 11.1 sec   PTT    Collection Time: 05/03/16 12:11 AM   Result Value Ref Range    aPTT 29.4 22.1 - 32.5 sec    aPTT, therapeutic range     58.0 - 77.0 SECS     ________________________________________________________________________  Medications reviewed  Current Facility-Administered Medications   Medication Dose Route Frequency   ??? loratadine (CLARITIN) tablet 10 mg  10 mg Oral DAILY   ??? 0.9% sodium chloride infusion  125 mL/hr IntraVENous CONTINUOUS   ??? sodium chloride (NS) flush 5-10 mL  5-10 mL IntraVENous Q8H   ??? sodium chloride (NS) flush 5-10 mL  5-10 mL IntraVENous PRN   ??? acetaminophen (TYLENOL) tablet 650 mg  650 mg Oral Q4H PRN   ??? HYDROcodone-acetaminophen (NORCO) 5-325 mg per tablet 1 Tab  1 Tab Oral Q4H PRN   ??? naloxone (NARCAN) injection 0.4 mg  0.4 mg IntraVENous PRN   ??? diphenhydrAMINE (BENADRYL) injection 12.5 mg  12.5 mg IntraVENous Q4H PRN   ??? ondansetron (ZOFRAN) injection 4 mg  4 mg IntraVENous Q4H PRN   ??? docusate sodium (COLACE) capsule 100 mg  100 mg Oral BID   ??? enoxaparin (LOVENOX) injection 40 mg  40 mg SubCUTAneous Q24H   ??? lactulose (CHRONULAC) solution 30 g  30 g Oral TID   ??? LORazepam (ATIVAN) injection 1 mg  1 mg IntraVENous Q4H PRN   ??? 0.9% sodium chloride 1,000 mL with mvi, adult no.4 with vit K 10 mL, thiamine 195 mg, folic acid 1 mg infusion   IntraVENous Q24H   ??? saline peripheral flush soln 10 mL  10 mL IntraVENous PRN   ??? sodium chloride (NS) flush 5-10 mL  5-10 mL IntraVENous Q8H   ??? sodium chloride (NS) flush 5-10 mL  5-10 mL IntraVENous PRN       Windle Guard, MD  05/03/2016  2:19 AM

## 2016-05-05 NOTE — Telephone Encounter (Signed)
Care manager called patient to follow up. Unable to reach patient, left voicemail. Will need to confirm PCP prior to scheduling f/u visit.    Spero GeraldsAlexandra Chamika Cunanan, MSW  [phone]

## 2016-05-08 NOTE — Telephone Encounter (Signed)
Second attempt to reach patient unsuccessful. Left voicemail.    Spero GeraldsAlexandra Gladys Gutman, MSW  [phone]

## 2016-10-16 ENCOUNTER — Inpatient Hospital Stay
Admit: 2016-10-16 | Discharge: 2016-10-16 | Disposition: A | Discharge status: Home / Self Care | Payer: MEDICAID | Attending: Emergency Medicine

## 2016-10-16 DIAGNOSIS — R0781 Pleurodynia: Secondary | ICD-10-CM

## 2016-10-16 MED ORDER — NAPROXEN 500 MG TAB
500 mg | ORAL_TABLET | Freq: Two times a day (BID) | ORAL | 0 refills | Status: AC | PRN
Start: 2016-10-16 — End: ?

## 2016-10-16 MED ORDER — NAPROXEN 250 MG TAB
250 mg | ORAL | Status: AC
Start: 2016-10-16 — End: 2016-10-16
  Administered 2016-10-16: 07:00:00 via ORAL

## 2016-10-16 MED ORDER — TRAMADOL 50 MG TAB
50 mg | ORAL | Status: AC
Start: 2016-10-16 — End: 2016-10-16
  Administered 2016-10-16: 07:00:00 via ORAL

## 2016-10-16 MED ORDER — HYDROCODONE-ACETAMINOPHEN 5 MG-325 MG TAB
5-325 mg | ORAL_TABLET | ORAL | 0 refills | Status: AC | PRN
Start: 2016-10-16 — End: ?

## 2016-10-16 MED FILL — NAPROXEN 250 MG TAB: 250 mg | ORAL | Qty: 2

## 2016-10-16 MED FILL — TRAMADOL 50 MG TAB: 50 mg | ORAL | Qty: 1
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
## 2016-11-18 DIAGNOSIS — Z5321 Procedure and treatment not carried out due to patient leaving prior to being seen by health care provider: Secondary | ICD-10-CM

## 2016-11-18 NOTE — ED Notes (Signed)
Attempted to bring patient into back, he states " I'm Ok", patient then walked out of ED doors.

## 2016-11-19 ENCOUNTER — Inpatient Hospital Stay: Admit: 2016-11-19 | Discharge: 2016-11-19 | Payer: MEDICAID | Attending: Emergency Medicine

## 2017-12-19 ENCOUNTER — Emergency Department (HOSPITAL_COMMUNITY)
Admission: EM | Admit: 2017-12-19 | Discharge: 2017-12-20 | Disposition: A | Discharge status: Home / Self Care | Payer: Medicaid Other | Attending: Emergency Medicine | Admitting: Emergency Medicine

## 2017-12-19 ENCOUNTER — Encounter (HOSPITAL_COMMUNITY): Payer: Self-pay | Admitting: Emergency Medicine

## 2017-12-19 ENCOUNTER — Other Ambulatory Visit: Payer: Self-pay

## 2017-12-19 DIAGNOSIS — R569 Unspecified convulsions: Secondary | ICD-10-CM | POA: Insufficient documentation

## 2017-12-19 DIAGNOSIS — R892 Abnormal level of other drugs, medicaments and biological substances in specimens from other organs, systems and tissues: Secondary | ICD-10-CM | POA: Diagnosis not present

## 2017-12-19 DIAGNOSIS — Z5181 Encounter for therapeutic drug level monitoring: Secondary | ICD-10-CM

## 2017-12-19 DIAGNOSIS — F10220 Alcohol dependence with intoxication, uncomplicated: Secondary | ICD-10-CM | POA: Insufficient documentation

## 2017-12-19 DIAGNOSIS — Z8505 Personal history of malignant neoplasm of liver: Secondary | ICD-10-CM | POA: Diagnosis not present

## 2017-12-19 DIAGNOSIS — F209 Schizophrenia, unspecified: Secondary | ICD-10-CM | POA: Insufficient documentation

## 2017-12-19 DIAGNOSIS — F17210 Nicotine dependence, cigarettes, uncomplicated: Secondary | ICD-10-CM | POA: Insufficient documentation

## 2017-12-19 HISTORY — DX: Unspecified viral hepatitis C without hepatic coma: B19.20

## 2017-12-19 HISTORY — DX: Malignant (primary) neoplasm, unspecified: C80.1

## 2017-12-19 HISTORY — DX: Schizophrenia, unspecified: F20.9

## 2017-12-19 MED ORDER — THIAMINE HCL 100 MG/ML IJ SOLN
Freq: Once | INTRAVENOUS | Status: AC
  Administered 2017-12-20: 01:00:00 via INTRAVENOUS
  Filled 2017-12-19: qty 1000

## 2017-12-19 NOTE — ED Triage Notes (Signed)
Pt presents by EMS for evaluation of alcohol intoxication. EMS reports that pt has hx of seizures, liver cancer, hepitits C, and schizophrenia. Pt states that he is having a seizure while alert and talking without any difficulty. Pt also reports having 3 other seizures today and typically drinks 2.5gallons of liquor daily.
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
## 2017-12-20 LAB — ETHANOL: ALCOHOL ETHYL (B): 285 mg/dL — AB (ref <10)

## 2017-12-20 LAB — PHENYTOIN LEVEL, TOTAL

## 2017-12-20 MED ORDER — PHENYTOIN SODIUM EXTENDED 100 MG PO CAPS: 100.0000 mg | ORAL_CAPSULE | Freq: Three times a day (TID) | ORAL | 0 refills | Status: DC

## 2017-12-20 MED ORDER — PHENYTOIN SODIUM 50 MG/ML IJ SOLN
1000.0000 mg | Freq: Once | INTRAMUSCULAR | Status: AC
  Administered 2017-12-20: 1000 mg via INTRAVENOUS
  Filled 2017-12-20: qty 20

## 2017-12-20 NOTE — ED Notes (Addendum)
Pt ambulated to bathroom. Full, one person assist was needed to keep him from falling. Meal given and PO fluids. Pt is sleeping.

## 2017-12-20 NOTE — ED Notes (Signed)
Pt able to ambulate without difficulty down hallway.

## 2017-12-20 NOTE — ED Notes (Signed)
Pt ambulatory to the bathroom but still stumbling. Pt given a meal tray for lunch and is currently eating

## 2017-12-20 NOTE — ED Notes (Signed)
Pt given discharge instructions and pt denies any questions or concerns

## 2017-12-20 NOTE — ED Notes (Signed)
Pt asleep, will obtain vital signs once pt wakes up.

## 2017-12-20 NOTE — ED Notes (Signed)
Pt steady in ambulation to the bathroom.

## 2018-05-22 ENCOUNTER — Other Ambulatory Visit: Payer: Self-pay

## 2018-05-22 ENCOUNTER — Emergency Department (HOSPITAL_COMMUNITY)
Admission: EM | Admit: 2018-05-22 | Discharge: 2018-05-22 | Disposition: A | Discharge status: Home / Self Care | Payer: Medicaid Other | Attending: Emergency Medicine | Admitting: Emergency Medicine

## 2018-05-22 ENCOUNTER — Emergency Department (HOSPITAL_COMMUNITY): Payer: Medicaid Other

## 2018-05-22 ENCOUNTER — Encounter (HOSPITAL_COMMUNITY): Payer: Self-pay | Admitting: Emergency Medicine

## 2018-05-22 DIAGNOSIS — Y998 Other external cause status: Secondary | ICD-10-CM | POA: Diagnosis not present

## 2018-05-22 DIAGNOSIS — F10120 Alcohol abuse with intoxication, uncomplicated: Secondary | ICD-10-CM | POA: Insufficient documentation

## 2018-05-22 DIAGNOSIS — S0512XA Contusion of eyeball and orbital tissues, left eye, initial encounter: Secondary | ICD-10-CM

## 2018-05-22 DIAGNOSIS — F10920 Alcohol use, unspecified with intoxication, uncomplicated: Secondary | ICD-10-CM

## 2018-05-22 DIAGNOSIS — F17210 Nicotine dependence, cigarettes, uncomplicated: Secondary | ICD-10-CM | POA: Insufficient documentation

## 2018-05-22 DIAGNOSIS — Z8505 Personal history of malignant neoplasm of liver: Secondary | ICD-10-CM | POA: Diagnosis not present

## 2018-05-22 DIAGNOSIS — Y9389 Activity, other specified: Secondary | ICD-10-CM | POA: Insufficient documentation

## 2018-05-22 DIAGNOSIS — S0181XA Laceration without foreign body of other part of head, initial encounter: Secondary | ICD-10-CM | POA: Diagnosis not present

## 2018-05-22 DIAGNOSIS — Y929 Unspecified place or not applicable: Secondary | ICD-10-CM | POA: Insufficient documentation

## 2018-05-22 DIAGNOSIS — Z79899 Other long term (current) drug therapy: Secondary | ICD-10-CM | POA: Diagnosis not present

## 2018-05-22 DIAGNOSIS — Z23 Encounter for immunization: Secondary | ICD-10-CM | POA: Diagnosis not present

## 2018-05-22 DIAGNOSIS — F209 Schizophrenia, unspecified: Secondary | ICD-10-CM | POA: Diagnosis not present

## 2018-05-22 DIAGNOSIS — S0990XA Unspecified injury of head, initial encounter: Secondary | ICD-10-CM | POA: Diagnosis present

## 2018-05-22 DIAGNOSIS — H1132 Conjunctival hemorrhage, left eye: Secondary | ICD-10-CM | POA: Insufficient documentation

## 2018-05-22 LAB — CBC WITH DIFFERENTIAL/PLATELET
ABS IMMATURE GRANULOCYTES: 0 10*3/uL (ref 0.0–0.1)
BASOS PCT: 1 %
Basophils Absolute: 0 10*3/uL (ref 0.0–0.1)
EOS ABS: 0.2 10*3/uL (ref 0.0–0.7)
Eosinophils Relative: 4 %
HCT: 46.4 % (ref 39.0–52.0)
Hemoglobin: 14.9 g/dL (ref 13.0–17.0)
IMMATURE GRANULOCYTES: 0 %
LYMPHS ABS: 1.8 10*3/uL (ref 0.7–4.0)
Lymphocytes Relative: 42 %
MCH: 32.1 pg (ref 26.0–34.0)
MCHC: 32.1 g/dL (ref 30.0–36.0)
MCV: 100 fL (ref 78.0–100.0)
MONOS PCT: 12 %
Monocytes Absolute: 0.5 10*3/uL (ref 0.1–1.0)
NEUTROS ABS: 1.8 10*3/uL (ref 1.7–7.7)
NEUTROS PCT: 41 %
PLATELETS: 60 10*3/uL — AB (ref 150–400)
RBC: 4.64 MIL/uL (ref 4.22–5.81)
RDW: 14.4 % (ref 11.5–15.5)
WBC: 4.4 10*3/uL (ref 4.0–10.5)

## 2018-05-22 LAB — COMPREHENSIVE METABOLIC PANEL
ALBUMIN: 3.4 g/dL — AB (ref 3.5–5.0)
ALK PHOS: 80 U/L (ref 38–126)
ALT: 54 U/L — ABNORMAL HIGH (ref 0–44)
ANION GAP: 13 (ref 5–15)
AST: 85 U/L — AB (ref 15–41)
BILIRUBIN TOTAL: 1.4 mg/dL — AB (ref 0.3–1.2)
BUN: 10 mg/dL (ref 8–23)
CALCIUM: 8.5 mg/dL — AB (ref 8.9–10.3)
CO2: 20 mmol/L — ABNORMAL LOW (ref 22–32)
Chloride: 109 mmol/L (ref 98–111)
Creatinine, Ser: 0.73 mg/dL (ref 0.61–1.24)
GFR calc Af Amer: >60 mL/min (ref >60)
Glucose, Bld: 94 mg/dL (ref 70–99)
POTASSIUM: 3.7 mmol/L (ref 3.5–5.1)
Sodium: 142 mmol/L (ref 135–145)
TOTAL PROTEIN: 7.8 g/dL (ref 6.5–8.1)

## 2018-05-22 LAB — ETHANOL: ALCOHOL ETHYL (B): 303 mg/dL — AB (ref <10)

## 2018-05-22 MED ORDER — CEFAZOLIN SODIUM-DEXTROSE 2-4 GM/100ML-% IV SOLN
2.0000 g | Freq: Once | INTRAVENOUS | Status: AC
  Administered 2018-05-22: 2 g via INTRAVENOUS

## 2018-05-22 MED ORDER — TETRACAINE HCL 0.5 % OP SOLN
2.0000 [drp] | Freq: Once | OPHTHALMIC | Status: AC
  Administered 2018-05-22: 2 [drp] via OPHTHALMIC
  Filled 2018-05-22: qty 4

## 2018-05-22 MED ORDER — TETANUS-DIPHTH-ACELL PERTUSSIS 5-2.5-18.5 LF-MCG/0.5 IM SUSP
0.5000 mL | Freq: Once | INTRAMUSCULAR | Status: AC
  Administered 2018-05-22: 0.5 mL via INTRAMUSCULAR
  Filled 2018-05-22: qty 0.5

## 2018-05-22 MED ORDER — FLUORESCEIN SODIUM 1 MG OP STRP
1.0000 | ORAL_STRIP | Freq: Once | OPHTHALMIC | Status: AC
  Administered 2018-05-22: 1 via OPHTHALMIC
  Filled 2018-05-22: qty 1

## 2018-05-22 MED ORDER — CLINDAMYCIN HCL 300 MG PO CAPS: 300.0000 mg | ORAL_CAPSULE | Freq: Three times a day (TID) | ORAL | 0 refills | Status: DC

## 2018-05-22 MED ORDER — SODIUM CHLORIDE 0.9 % IV BOLUS
1000.0000 mL | Freq: Once | INTRAVENOUS | Status: AC
  Administered 2018-05-22: 1000 mL via INTRAVENOUS

## 2018-05-22 MED ORDER — CEFAZOLIN SODIUM-DEXTROSE 2-4 GM/100ML-% IV SOLN
INTRAVENOUS | Status: AC
  Filled 2018-05-22: qty 100

## 2018-05-22 MED ORDER — ERYTHROMYCIN 5 MG/GM OP OINT
1.0000 "application " | TOPICAL_OINTMENT | Freq: Once | OPHTHALMIC | Status: AC
  Administered 2018-05-22: 1 via OPHTHALMIC
  Filled 2018-05-22: qty 3.5

## 2018-05-22 MED ORDER — LORAZEPAM 2 MG/ML IJ SOLN
1.0000 mg | Freq: Once | INTRAMUSCULAR | Status: AC
  Administered 2018-05-22: 1 mg via INTRAVENOUS
  Filled 2018-05-22: qty 1
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
## 2018-05-22 NOTE — ED Provider Notes (Signed)
Patient awake, alert, ambulatory, requesting food.   Carmin Muskrat, MD 05/22/18 1105

## 2018-05-22 NOTE — Discharge Instructions (Addendum)
Avoid drinking alcohol.   Take clindamycin as prescribed.   Expect more ecchymosis around the left eye.   Please try erythromycin ointment to the left eye twice daily for 5 days.   Follow up with eye doctor and ENT doctor   Return to ER if you have worse eye pain, fever, vomiting, headaches.

## 2018-05-22 NOTE — ED Notes (Signed)
Dr. Darl Householder at bedside applying dermabond to left forehead and left eyebrow lacerations
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
## 2018-05-22 NOTE — ED Notes (Signed)
Pt more awake ambulated to restroom to wash up and change into paper scrubs. Pt given d/c papers and bus ticket.

## 2018-05-22 NOTE — ED Notes (Signed)
Pt to CT at this time.

## 2018-05-22 NOTE — ED Notes (Signed)
Pt ambulated to Restroom and given [place] sandwich and sprite.

## 2018-09-06 ENCOUNTER — Other Ambulatory Visit: Payer: Self-pay

## 2018-09-06 ENCOUNTER — Emergency Department (HOSPITAL_COMMUNITY)
Admission: EM | Admit: 2018-09-06 | Discharge: 2018-09-06 | Disposition: A | Discharge status: Home / Self Care | Payer: Medicaid Other | Attending: Emergency Medicine | Admitting: Emergency Medicine

## 2018-09-06 ENCOUNTER — Encounter (HOSPITAL_COMMUNITY): Payer: Self-pay

## 2018-09-06 DIAGNOSIS — F10229 Alcohol dependence with intoxication, unspecified: Secondary | ICD-10-CM | POA: Insufficient documentation

## 2018-09-06 DIAGNOSIS — Y908 Blood alcohol level of 240 mg/100 ml or more: Secondary | ICD-10-CM | POA: Diagnosis not present

## 2018-09-06 DIAGNOSIS — G40909 Epilepsy, unspecified, not intractable, without status epilepticus: Secondary | ICD-10-CM | POA: Insufficient documentation

## 2018-09-06 DIAGNOSIS — F10920 Alcohol use, unspecified with intoxication, uncomplicated: Secondary | ICD-10-CM

## 2018-09-06 LAB — URINALYSIS, ROUTINE W REFLEX MICROSCOPIC
Bilirubin Urine: NEGATIVE
GLUCOSE, UA: NEGATIVE mg/dL
HGB URINE DIPSTICK: NEGATIVE
KETONES UR: NEGATIVE mg/dL
Leukocytes, UA: NEGATIVE
Nitrite: NEGATIVE
PROTEIN: NEGATIVE mg/dL
Specific Gravity, Urine: 1.003 — ABNORMAL LOW (ref 1.005–1.030)
pH: 6 (ref 5.0–8.0)

## 2018-09-06 LAB — COMPREHENSIVE METABOLIC PANEL
ALK PHOS: 66 U/L (ref 38–126)
ALT: 50 U/L — AB (ref 0–44)
ANION GAP: 12 (ref 5–15)
AST: 125 U/L — ABNORMAL HIGH (ref 15–41)
Albumin: 3.7 g/dL (ref 3.5–5.0)
BUN: 5 mg/dL — ABNORMAL LOW (ref 8–23)
CALCIUM: 8.8 mg/dL — AB (ref 8.9–10.3)
CO2: 24 mmol/L (ref 22–32)
Chloride: 104 mmol/L (ref 98–111)
Creatinine, Ser: 0.74 mg/dL (ref 0.61–1.24)
GFR calc Af Amer: >60 mL/min (ref >60)
GFR calc non Af Amer: >60 mL/min (ref >60)
Glucose, Bld: 101 mg/dL — ABNORMAL HIGH (ref 70–99)
Potassium: 3.7 mmol/L (ref 3.5–5.1)
SODIUM: 140 mmol/L (ref 135–145)
Total Bilirubin: 2.3 mg/dL — ABNORMAL HIGH (ref 0.3–1.2)
Total Protein: 8.2 g/dL — ABNORMAL HIGH (ref 6.5–8.1)

## 2018-09-06 LAB — CBC WITH DIFFERENTIAL/PLATELET
ABS IMMATURE GRANULOCYTES: 0.01 10*3/uL (ref 0.00–0.07)
Basophils Absolute: 0 10*3/uL (ref 0.0–0.1)
Basophils Relative: 1 %
EOS ABS: 0.1 10*3/uL (ref 0.0–0.5)
Eosinophils Relative: 2 %
HEMATOCRIT: 44.6 % (ref 39.0–52.0)
Hemoglobin: 14.9 g/dL (ref 13.0–17.0)
IMMATURE GRANULOCYTES: 0 %
LYMPHS ABS: 1.5 10*3/uL (ref 0.7–4.0)
Lymphocytes Relative: 37 %
MCH: 32.2 pg (ref 26.0–34.0)
MCHC: 33.4 g/dL (ref 30.0–36.0)
MCV: 96.3 fL (ref 80.0–100.0)
Monocytes Absolute: 0.4 10*3/uL (ref 0.1–1.0)
Monocytes Relative: 11 %
NEUTROS PCT: 49 %
Neutro Abs: 2 10*3/uL (ref 1.7–7.7)
Platelets: 37 10*3/uL — ABNORMAL LOW (ref 150–400)
RBC: 4.63 MIL/uL (ref 4.22–5.81)
RDW: 14.6 % (ref 11.5–15.5)
WBC: 4 10*3/uL (ref 4.0–10.5)
nRBC: 0 % (ref 0.0–0.2)

## 2018-09-06 LAB — MAGNESIUM: Magnesium: 1.9 mg/dL (ref 1.7–2.4)

## 2018-09-06 LAB — RAPID URINE DRUG SCREEN, HOSP PERFORMED
Amphetamines: NOT DETECTED
BARBITURATES: NOT DETECTED
BENZODIAZEPINES: NOT DETECTED
COCAINE: NOT DETECTED
Opiates: NOT DETECTED
TETRAHYDROCANNABINOL: NOT DETECTED

## 2018-09-06 LAB — PHENYTOIN LEVEL, TOTAL

## 2018-09-06 LAB — ETHANOL: Alcohol, Ethyl (B): 357 mg/dL (ref <10)

## 2018-09-06 MED ORDER — SODIUM CHLORIDE 0.9 % IV SOLN
INTRAVENOUS | Status: DC
  Administered 2018-09-06: 19:00:00 via INTRAVENOUS

## 2018-09-06 MED ORDER — SODIUM CHLORIDE 0.9 % IV BOLUS
1000.0000 mL | Freq: Once | INTRAVENOUS | Status: AC
  Administered 2018-09-06: 1000 mL via INTRAVENOUS

## 2018-09-06 MED ORDER — PHENYTOIN SODIUM EXTENDED 100 MG PO CAPS: 100.0000 mg | ORAL_CAPSULE | Freq: Three times a day (TID) | ORAL | 1 refills | Status: DC

## 2018-09-06 NOTE — Discharge Instructions (Signed)
Take the Dilantin as directed.  Return for any new or worse symptoms.

## 2018-09-06 NOTE — ED Notes (Signed)
Patient verbalizes understanding of discharge instructions. Opportunity for questioning and answers were provided. Armband removed by staff, pt discharged from ED.  
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
## 2018-09-07 LAB — LEVETIRACETAM LEVEL: LEVETIRACETAM: NOT DETECTED ug/mL (ref 10.0–40.0)

## 2018-09-15 ENCOUNTER — Other Ambulatory Visit: Payer: Self-pay

## 2018-09-15 ENCOUNTER — Emergency Department (HOSPITAL_COMMUNITY)
Admission: EM | Admit: 2018-09-15 | Discharge: 2018-09-15 | Disposition: A | Discharge status: Home / Self Care | Payer: Medicaid Other | Attending: Emergency Medicine | Admitting: Emergency Medicine

## 2018-09-15 ENCOUNTER — Encounter (HOSPITAL_COMMUNITY): Payer: Self-pay

## 2018-09-15 DIAGNOSIS — Z5321 Procedure and treatment not carried out due to patient leaving prior to being seen by health care provider: Secondary | ICD-10-CM | POA: Diagnosis not present

## 2018-09-15 DIAGNOSIS — F10129 Alcohol abuse with intoxication, unspecified: Secondary | ICD-10-CM | POA: Insufficient documentation

## 2018-09-15 NOTE — ED Notes (Signed)
No answer x3

## 2018-09-15 NOTE — ED Notes (Signed)
Pt called from the lobby with no response x2 
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
## 2018-09-15 NOTE — ED Notes (Signed)
Pt no answer when called from the lobby

## 2018-09-26 ENCOUNTER — Encounter (HOSPITAL_COMMUNITY): Payer: Self-pay | Admitting: Emergency Medicine

## 2018-09-26 ENCOUNTER — Other Ambulatory Visit: Payer: Self-pay

## 2018-09-26 ENCOUNTER — Emergency Department (HOSPITAL_COMMUNITY)
Admission: EM | Admit: 2018-09-26 | Discharge: 2018-09-27 | Disposition: A | Discharge status: Home / Self Care | Payer: Medicaid Other | Attending: Emergency Medicine | Admitting: Emergency Medicine

## 2018-09-26 ENCOUNTER — Emergency Department (HOSPITAL_COMMUNITY): Payer: Medicaid Other

## 2018-09-26 DIAGNOSIS — F17210 Nicotine dependence, cigarettes, uncomplicated: Secondary | ICD-10-CM | POA: Insufficient documentation

## 2018-09-26 DIAGNOSIS — D015 Carcinoma in situ of liver, gallbladder and bile ducts: Secondary | ICD-10-CM | POA: Insufficient documentation

## 2018-09-26 DIAGNOSIS — Z79899 Other long term (current) drug therapy: Secondary | ICD-10-CM | POA: Insufficient documentation

## 2018-09-26 DIAGNOSIS — Y908 Blood alcohol level of 240 mg/100 ml or more: Secondary | ICD-10-CM | POA: Insufficient documentation

## 2018-09-26 DIAGNOSIS — F10929 Alcohol use, unspecified with intoxication, unspecified: Secondary | ICD-10-CM | POA: Diagnosis present

## 2018-09-26 DIAGNOSIS — R569 Unspecified convulsions: Secondary | ICD-10-CM | POA: Insufficient documentation

## 2018-09-26 DIAGNOSIS — F10920 Alcohol use, unspecified with intoxication, uncomplicated: Secondary | ICD-10-CM | POA: Diagnosis not present

## 2018-09-26 HISTORY — DX: Unspecified convulsions: R56.9

## 2018-09-26 LAB — I-STAT CHEM 8, ED
BUN: 5 mg/dL — ABNORMAL LOW (ref 8–23)
CREATININE: 1.1 mg/dL (ref 0.61–1.24)
Calcium, Ion: 1 mmol/L — ABNORMAL LOW (ref 1.15–1.40)
Chloride: 108 mmol/L (ref 98–111)
Glucose, Bld: 93 mg/dL (ref 70–99)
HEMATOCRIT: 51 % (ref 39.0–52.0)
Hemoglobin: 17.3 g/dL — ABNORMAL HIGH (ref 13.0–17.0)
POTASSIUM: 3.6 mmol/L (ref 3.5–5.1)
Sodium: 144 mmol/L (ref 135–145)
TCO2: 25 mmol/L (ref 22–32)

## 2018-09-26 LAB — ETHANOL: Alcohol, Ethyl (B): 377 mg/dL (ref <10)

## 2018-09-26 MED ORDER — PHENYTOIN SODIUM EXTENDED 100 MG PO CAPS
100.0000 mg | ORAL_CAPSULE | Freq: Once | ORAL | Status: AC
  Administered 2018-09-26: 100 mg via ORAL
  Filled 2018-09-26: qty 1

## 2018-09-26 NOTE — ED Triage Notes (Signed)
Per [facility], Pt reports he had a seizure, pt has hx of grand mal seizures. ETOH on board. Pt alert and oriented at this time.

## 2018-09-26 NOTE — ED Provider Notes (Signed)
[facility] EMERGENCY DEPARTMENT Provider Note   CSN: [id number] Arrival date & time: 09/26/18  2148     History   Chief Complaint Chief Complaint  Patient presents with  . Alcohol Intoxication    HPI Benjamin Peterson is a 62 y.o. male.  HPI 62 year old male with history of schizophrenia, chronic alcoholism, here with alcohol intoxication.  Patient intoxicated on arrival, limiting history.  However, per report, he was found wandering near the road.  He was unable to tell them which would see lived and so he was brought to the ED.  He is notably intoxicated and essentially unable to walk.  On my assessment, the patient states that he is "just fine" and is unable to provide further history. He declines any pain. No trauma.  Level 5 caveat invoked as remainder of history, ROS, and physical exam limited due to patient's alcohol intoxication.   Past Medical History:  Diagnosis Date  . Cancer ([place])    liver  . Hepatitis C   . Schizophrenia ([place])   . Seizures ([place])     There are no active problems to display for this patient.   No past surgical history on file.      Home Medications    Prior to Admission medications   Medication Sig Start Date End Date Taking? Authorizing Provider  phenytoin (DILANTIN) 100 MG ER capsule Take 1 capsule (100 mg total) by mouth 3 (three) times daily. 12/20/17   Molpus, John, MD  phenytoin (DILANTIN) 100 MG ER capsule Take 1 capsule (100 mg total) by mouth 3 (three) times daily. 09/06/18   Fredia Sorrow, MD    Family History No family history on file.  Social History Social History   Tobacco Use  . Smoking status: Heavy Tobacco Smoker    Packs/day: 1.00    Types: Cigarettes  . Smokeless tobacco: Never Used  Substance Use Topics  . Alcohol use: Yes    Comment: 2.5 gallons of liquor  . Drug use: No     Allergies   Patient has no known allergies.   Review of Systems Review of Systems  Unable to perform ROS:  Mental status change     Physical Exam Updated Vital Signs BP (!) 139/93   Pulse 85   Temp (!) 97.3 F (36.3 C)   Resp 19   SpO2 96%   Physical Exam  Constitutional: He appears well-developed and well-nourished.  Disheveled, smells of alcohol  HENT:  Head: Normocephalic and atraumatic.  Poor dentition. OP clear.  Eyes: Conjunctivae are normal.  Neck: Neck supple.  Cardiovascular: Normal rate, regular rhythm and normal heart sounds. Exam reveals no friction rub.  No murmur heard. Pulmonary/Chest: Effort normal and breath sounds normal. No respiratory distress. He has no wheezes. He has no rales.  Abdominal: He exhibits no distension.  Musculoskeletal: He exhibits no edema.  Neurological: He is alert. He exhibits normal muscle tone.  Oriented to person, location but not time. Ataxic gait.  Skin: Skin is warm. Capillary refill takes less than 2 seconds.  Psychiatric: He has a normal mood and affect.  Nursing note and vitals reviewed.    ED Treatments / Results  Labs (all labs ordered are listed, but only abnormal results are displayed) Labs Reviewed  ETHANOL - Abnormal; Notable for the following components:      Result Value   Alcohol, Ethyl (B) 377 (*)    All other components within normal limits  I-STAT CHEM 8, ED - Abnormal;  Notable for the following components:   BUN 5 (*)    Calcium, Ion 1.00 (*)    Hemoglobin 17.3 (*)    All other components within normal limits    EKG None  Radiology Ct Head Wo Contrast  Result Date: 09/26/2018 CLINICAL DATA:  Acute onset of seizure. Altered level of consciousness. EXAM: CT HEAD WITHOUT CONTRAST TECHNIQUE: Contiguous axial images were obtained from the base of the skull through the vertex without intravenous contrast. COMPARISON:  CT of the head performed 05/22/2018 FINDINGS: Brain: No evidence of acute infarction, hemorrhage, hydrocephalus, extra-axial collection or mass lesion/mass effect. Mild periventricular white  matter change likely reflects small vessel ischemic microangiopathy. The posterior fossa, including the cerebellum, brainstem and fourth ventricle, is within normal limits. The third and lateral ventricles, and basal ganglia are unremarkable in appearance. The cerebral hemispheres are symmetric in appearance, with normal gray-white differentiation. No mass effect or midline shift is seen. Vascular: No hyperdense vessel or unexpected calcification. Skull: There is no evidence of fracture; visualized osseous structures are unremarkable in appearance. Multiple maxillary dental caries are noted. Sinuses/Orbits: The orbits are within normal limits. The paranasal sinuses and mastoid air cells are well-aerated. Other: No significant soft tissue abnormalities are seen. IMPRESSION: 1. No acute intracranial pathology seen on CT. 2. Mild small vessel ischemic microangiopathy. 3. Multiple maxillary dental caries noted. Electronically Signed   By: Garald Balding M.D.   On: 09/26/2018 22:45    Procedures Procedures (including critical care time)  Medications Ordered in ED Medications  phenytoin (DILANTIN) ER capsule 100 mg (100 mg Oral Given 09/26/18 2214)     Initial Impression / Assessment and Plan / ED Course  I have reviewed the triage vital signs and the nursing notes.  Pertinent labs & imaging results that were available during my care of the patient were reviewed by me and considered in my medical decision making (see chart for details).     62 year old male here with alcohol intoxication.  Patient has a history of the same.  Alcohol level is 377.  Labs otherwise unremarkable.  CT head negative.  Will allow to sober and reassess.  Final Clinical Impressions(s) / ED Diagnoses   Final diagnoses:  Alcoholic intoxication without complication [facility])    ED Discharge Orders    None       Duffy Bruce, MD 09/26/18 2356

## 2018-09-27 NOTE — ED Provider Notes (Signed)
Patient signed out to me to follow improvement secondary to acute alcohol intoxication.  Patient has been monitored through the night and has done well.  Patient now awake and alert, eating breakfast and ambulatory.  Will discharge.   Orpah Greek, MD 09/27/18 [phone]

## 2018-09-27 NOTE — ED Notes (Signed)
Patient is alert and orientedx4.  Patient was explained discharge instructions and they understood them with no questions.   

## 2018-10-23 ENCOUNTER — Emergency Department (HOSPITAL_COMMUNITY)
Admission: EM | Admit: 2018-10-23 | Discharge: 2018-10-23 | Disposition: A | Discharge status: Home / Self Care | Payer: Medicaid Other | Attending: Emergency Medicine | Admitting: Emergency Medicine

## 2018-10-23 ENCOUNTER — Encounter (HOSPITAL_COMMUNITY): Payer: Self-pay

## 2018-10-23 DIAGNOSIS — F17210 Nicotine dependence, cigarettes, uncomplicated: Secondary | ICD-10-CM | POA: Diagnosis not present

## 2018-10-23 DIAGNOSIS — F10920 Alcohol use, unspecified with intoxication, uncomplicated: Secondary | ICD-10-CM | POA: Insufficient documentation

## 2018-10-23 DIAGNOSIS — Z8505 Personal history of malignant neoplasm of liver: Secondary | ICD-10-CM | POA: Diagnosis not present

## 2018-10-23 DIAGNOSIS — Z79899 Other long term (current) drug therapy: Secondary | ICD-10-CM | POA: Diagnosis not present

## 2018-10-23 DIAGNOSIS — F10929 Alcohol use, unspecified with intoxication, unspecified: Secondary | ICD-10-CM | POA: Diagnosis present

## 2018-10-23 LAB — CBG MONITORING, ED: GLUCOSE-CAPILLARY: 106 mg/dL — AB (ref 70–99)

## 2018-10-23 LAB — ETHANOL: ALCOHOL ETHYL (B): 183 mg/dL — AB (ref <10)

## 2018-10-23 NOTE — ED Notes (Signed)
CBG obtained (106 mg/dL)

## 2018-10-23 NOTE — ED Notes (Signed)
The pt  Reports that he drinks alcohol and has been DRUNK  For 3 weeks

## 2018-10-23 NOTE — ED Notes (Signed)
Pt ambulated. No concerns noted at this time.

## 2018-10-23 NOTE — ED Triage Notes (Signed)
Pt arrived via [facility]; bystander called and saw patient laying on street and called EMS; EMS reports patient was alert, but cld not recall event; pt told EMS he has seizures ; EMS noted smell of ETOH; no obvious injuries noted; 148/84; 80; 18; CBG 136; Pt has ETOH abuse and seizures

## 2018-10-23 NOTE — ED Notes (Signed)
Pt given food

## 2018-10-23 NOTE — ED Notes (Signed)
Pt sleeping still

## 2018-10-23 NOTE — ED Provider Notes (Signed)
[facility] EMERGENCY DEPARTMENT Provider Note   CSN: [id number] Arrival date & time: 10/23/18  1453     History   Chief Complaint Chief Complaint  Patient presents with  . Seizures    HPI Bob Daversa is a 62 y.o. male.  HPI  62 year old male with a known history of liver cancer, hepatitis C, schizophrenia and possible seizure disorder, also noted to be a chronic alcoholic.  He presents to the hospital today approximately 1 month after last being seen in the emergency department for alcohol intoxication.  Per the paramedics who are the primary historians the patient was found sleeping on the sidewalk by a bystander, they were called and found the patient to be laying down, slightly confused but without complaint.  They placed him in a cervical collar considering that he may have fallen and had head trauma or a seizure however the patient states that he remembers laying down, he does not know why he did that, he thinks maybe he has had a seizure or 2 in the last couple of days but does not recall, he cannot tell me the medications that he takes for seizures but does endorse drinking 2 40 oz bottles of ice house beer today.  Denies pain anywhere including chest pain shortness of breath headache or any other complaints.  Past Medical History:  Diagnosis Date  . Cancer ([place])    liver  . Hepatitis C   . Schizophrenia ([place])   . Seizures ([place])     There are no active problems to display for this patient.   History reviewed. No pertinent surgical history.      Home Medications    Prior to Admission medications   Medication Sig Start Date End Date Taking? Authorizing Provider  phenytoin (DILANTIN) 100 MG ER capsule Take 1 capsule (100 mg total) by mouth 3 (three) times daily. 12/20/17   Molpus, John, MD  phenytoin (DILANTIN) 100 MG ER capsule Take 1 capsule (100 mg total) by mouth 3 (three) times daily. 09/06/18   Fredia Sorrow, MD    Family  History History reviewed. No pertinent family history.  Social History Social History   Tobacco Use  . Smoking status: Heavy Tobacco Smoker    Packs/day: 1.00    Types: Cigarettes  . Smokeless tobacco: Never Used  Substance Use Topics  . Alcohol use: Yes    Comment: 2.5 gallons of liquor  . Drug use: No     Allergies   Patient has no known allergies.   Review of Systems Review of Systems  All other systems reviewed and are negative.    Physical Exam Updated Vital Signs BP 100/66   Pulse 65   Resp 16   SpO2 95%   Physical Exam  Constitutional: He appears well-developed and well-nourished. No distress.  HENT:  Head: Normocephalic and atraumatic.  Mouth/Throat: Oropharynx is clear and moist. No oropharyngeal exudate.  Poor dentition, several teeth left on the bottom, no teeth left abdomen top, no swelling of the jaw, no gingival abscesses, phonation is normal, oropharynx is clear, no bleeding, no tongue biting.  There is no tenderness over the scalp, no obvious malocclusion, hemotympanum, raccoon eyes or battle sign  Eyes: Conjunctivae and EOM are normal. Right eye exhibits no discharge. Left eye exhibits no discharge. No scleral icterus.  His right pupil is clear, the left pupil is hazy, he is blind bilaterally, his extraocular movements are normal.  He does not have any pupillary response to light  Neck: Normal range of motion. Neck supple. No JVD present. No thyromegaly present.  Cardiovascular: Normal rate, regular rhythm, normal heart sounds and intact distal pulses. Exam reveals no gallop and no friction rub.  No murmur heard. Pulmonary/Chest: Effort normal and breath sounds normal. No respiratory distress. He has no wheezes. He has no rales.  Abdominal: Soft. Bowel sounds are normal. He exhibits no distension and no mass. There is no tenderness.  Musculoskeletal: Normal range of motion. He exhibits no edema or tenderness.  Joints are diffusely supple, compartments  are diffusely soft, he is able to range all 4 of his extremities without any difficulty.  There is no spinal tenderness over the cervical thoracic or lumbar spines and the patient can fully range his neck without pain  Lymphadenopathy:    He has no cervical adenopathy.  Neurological: He is alert. Coordination normal.  The patient speech is clear, he trails off occasionally and his speech but is able to answer questions and follow commands.  He is able to sit up in the bed, use both arms, both legs, straight leg raise without difficulty.  He cannot perform finger-nose-finger secondary to blindness.  Skin: Skin is warm and dry. No rash noted. No erythema.  There is no skin breakdown lacerations contusions ecchymosis etc.  Psychiatric: He has a normal mood and affect. His behavior is normal.  Nursing note and vitals reviewed.    ED Treatments / Results  Labs (all labs ordered are listed, but only abnormal results are displayed) Labs Reviewed  ETHANOL - Abnormal; Notable for the following components:      Result Value   Alcohol, Ethyl (B) 183 (*)    All other components within normal limits  CBG MONITORING, ED - Abnormal; Notable for the following components:   Glucose-Capillary 106 (*)    All other components within normal limits    EKG EKG Interpretation  Date/Time:  Saturday October 23 2018 15:00:44 EST Ventricular Rate:  73 PR Interval:    QRS Duration: 107 QT Interval:  423 QTC Calculation: 467 R Axis:   -20 Text Interpretation:  Sinus rhythm Abnormal R-wave progression, early transition Left ventricular hypertrophy Nonspecific T abnormalities, inferior leads since 08/2018, thre is now signs of LVH - no other changes Confirmed by Noemi Chapel [phone]) on 10/23/2018 3:09:02 PM   Radiology No results found.  Procedures Procedures (including critical care time)  Medications Ordered in ED Medications - No data to display   Initial Impression / Assessment and Plan / ED  Course  I have reviewed the triage vital signs and the nursing notes.  Pertinent labs & imaging results that were available during my care of the patient were reviewed by me and considered in my medical decision making (see chart for details).    The patient's exam is rather unremarkable other than a slight change in his mental status which is likely secondary to alcohol intoxication which he freely endorses.  His physical exam is totally unremarkable without signs of having a seizure, there is no urinary incontinence tongue biting or focal neurologic deficits shy of his chronic blindness.  There is no signs of a fall, the patient states that he remembers laying down, he does not know why he did that or where it was that he was found.  We will check an alcohol level, blood glucose and an EKG.  EKG shows that the patient has left ventricular hypertrophy but otherwise no signs of ischemia or arrhythmia.  Labs pending, the patient  will be observed until returns to mental status.  I see no indication to do a CT scan at this time.  He has had multiple repeat visits for alcohol intoxication and there is nothing on exam that stands out as a reason to do a CT scan.  No evidence of trauma.  The ETOH is < 200, he is at his baseline at this time based on prior records, CBG was normal and EKG is unremarkable - has ambulated, urinated and tolerated a full meal.  STable for d/c.  Final Clinical Impressions(s) / ED Diagnoses   Final diagnoses:  Alcoholic intoxication without complication ([place])      Noemi Chapel, MD 10/23/18 [phone]

## 2018-10-23 NOTE — ED Notes (Signed)
P[t sleeping in the hallway

## 2018-10-23 NOTE — Discharge Instructions (Signed)
Please follow-up with the health and wellness center above for further counseling and direction towards substance abuse facilities to help with detox.  I have given you the list of resources below as well.  Take all your medications exactly as prescribed and return to the emergency department   Substance Abuse Treatment Programs  Intensive Outpatient Programs [facility]     [place]. [place], [place]       [facility] [place] [address] [place], [place]  [facility]     (Inpatient and outpatient)     [address]           [place] [phone] (Suboxone and Methadone)  [place], [place] [address]      [place] [address] [place], [place]  [facility] (Outpatient/Inpatient, [place])    (insurance only) [phone]             [facility] ([place]) [place], [place]     [facility]     [address]     [place], [place]       [facility] (for caregivers and family) [phone] [address] Kristeen Mans. [place], [place]      Residential Treatment Programs [facility]      [address] [place], [place] [address]  [phone]       [facility] [address] [place], [place] [address] [phone]  [facility]        [phone]       [facility] [phone]  [facility] ([place]             [place], [place] or [place] of [place], [address] [phone]  [facility]    [address]      [place], [place]       The  [facility] [address] [place], [place]  [place]   [address] [place], [place] [address]     [phone]      Admissions: 8am-3pm M-F  [facility] (RTS) [address] [place], [place]  [facility] Program: Residential Program [phone] Days)   [place], [place] or [phone]     [facility]: [facility] [place], [place] (Walk in Hours over  the weekend or by referral)  [facility] [place], [place], [place] [address] [phone]  Crisis Mobile: [facility]:  [phone] (for crisis response 24 hours a day) [facility] Hotline:      [phone] Outpatient Psychiatry and Counseling  [facility]: Mobile Crisis Management 24 hours:  [phone]  [facility] of the [employer] sliding scale fee and walk in schedule: M-F 8am-12pm/1pm-3pm [place], [place] [address] [place], [place] [address] [phone]  [facility] (Former[facility] as [facility])- new patient walk-in appointments available Monday - Friday 8am -3pm.          [address] [place], [place] [address] [phone] or crisis line- [facility]/ Intensive Outpatient Therapy Program [place], [place] [address] [place]      [phone] [address], [place] [address]                 [place]   [facility] [phone]. [place], [place] [address]   [employer] [facility] Care          [address] Johnette Abraham  [place], [place] [address]       [phone]  [facility] [address] [place], [place]  [address] [phone]  [facility]    [address] [place], [place] [address]     [place], [place] Joycelyn Man     [place] [address]     [phone]       [facility] [place] [address]  [facility]     [address], [place], [place], [place] [address] [place]     [address]     [place], [place] [address]     [phone]       [facility] [address] [place], [place] [address] [phone] Resources for Temporary Residential Assistance/Crisis [facility]) M-F 8am-3pm   [address], [place] [address]   [phone] Services include: laundry, barbering, support groups, case management, phone  & computer access, showers, AA/NA mtgs, mental health/substance abuse nurse, job skills class, disability information, VA assistance, spiritual classes, etc.   [facility]   [address], [place]     [place]              [employer] (women and children)       [place]. [place], [place] [address] [phone] [email or web address] for application and process Application Required  [facility]   [address]    [place]  [address]     [phone]  [place], [place] [address] [phone]([email or web address] application appt.) Application Required  [facility] (women only)    [address]     [place], [place] [address]     [phone]      Intake starts 6pm daily Need valid ID, SSC, & Police report [facility] [employer] [facility] [address] [place], [place] [phone] Application Required  [employer] (men only)     [place].      [place], [place]       [facility] (Pregnant women only) [address] [place], [place]  [facility]      [place] [address].      [place], [place] [address]     [phone]             [facility] [address] [place], [place] 90 day commitment/SA/Application process  [facility](men only)     [address]     [place], [place]       Check-in at [facility] [address] [place], [place] [address] [phone] Men/Women/Women and Children must be there by 7 pm  [place], [place]                for severe or worsening symptoms.

## 2018-10-23 NOTE — ED Notes (Signed)
NT collected labs.

## 2018-11-05 ENCOUNTER — Emergency Department (HOSPITAL_COMMUNITY): Payer: Medicaid Other

## 2018-11-05 ENCOUNTER — Emergency Department (HOSPITAL_COMMUNITY)
Admission: EM | Admit: 2018-11-05 | Discharge: 2018-11-06 | Disposition: A | Discharge status: Home / Self Care | Payer: Medicaid Other | Attending: Emergency Medicine | Admitting: Emergency Medicine

## 2018-11-05 ENCOUNTER — Encounter (HOSPITAL_COMMUNITY): Payer: Self-pay | Admitting: Emergency Medicine

## 2018-11-05 ENCOUNTER — Other Ambulatory Visit: Payer: Self-pay

## 2018-11-05 DIAGNOSIS — Z79899 Other long term (current) drug therapy: Secondary | ICD-10-CM | POA: Insufficient documentation

## 2018-11-05 DIAGNOSIS — R52 Pain, unspecified: Secondary | ICD-10-CM

## 2018-11-05 DIAGNOSIS — F1020 Alcohol dependence, uncomplicated: Secondary | ICD-10-CM | POA: Insufficient documentation

## 2018-11-05 DIAGNOSIS — F1010 Alcohol abuse, uncomplicated: Secondary | ICD-10-CM

## 2018-11-05 DIAGNOSIS — F17210 Nicotine dependence, cigarettes, uncomplicated: Secondary | ICD-10-CM | POA: Diagnosis not present

## 2018-11-05 DIAGNOSIS — F10920 Alcohol use, unspecified with intoxication, uncomplicated: Secondary | ICD-10-CM

## 2018-11-05 DIAGNOSIS — R4182 Altered mental status, unspecified: Secondary | ICD-10-CM | POA: Diagnosis present

## 2018-11-05 LAB — CBC WITH DIFFERENTIAL/PLATELET
ABS IMMATURE GRANULOCYTES: 0.01 10*3/uL (ref 0.00–0.07)
BASOS ABS: 0 10*3/uL (ref 0.0–0.1)
Basophils Relative: 1 %
EOS ABS: 0.1 10*3/uL (ref 0.0–0.5)
Eosinophils Relative: 2 %
HEMATOCRIT: 44.8 % (ref 39.0–52.0)
HEMOGLOBIN: 14.7 g/dL (ref 13.0–17.0)
IMMATURE GRANULOCYTES: 0 %
LYMPHS PCT: 34 %
Lymphs Abs: 2 10*3/uL (ref 0.7–4.0)
MCH: 31.7 pg (ref 26.0–34.0)
MCHC: 32.8 g/dL (ref 30.0–36.0)
MCV: 96.8 fL (ref 80.0–100.0)
MONOS PCT: 9 %
Monocytes Absolute: 0.5 10*3/uL (ref 0.1–1.0)
NEUTROS ABS: 3.2 10*3/uL (ref 1.7–7.7)
NEUTROS PCT: 54 %
NRBC: 0 % (ref 0.0–0.2)
Platelets: 56 10*3/uL — ABNORMAL LOW (ref 150–400)
RBC: 4.63 MIL/uL (ref 4.22–5.81)
RDW: 14.3 % (ref 11.5–15.5)
WBC: 5.9 10*3/uL (ref 4.0–10.5)

## 2018-11-05 LAB — RAPID URINE DRUG SCREEN, HOSP PERFORMED
Amphetamines: NOT DETECTED
BENZODIAZEPINES: NOT DETECTED
Barbiturates: NOT DETECTED
COCAINE: NOT DETECTED
OPIATES: NOT DETECTED
TETRAHYDROCANNABINOL: NOT DETECTED

## 2018-11-05 LAB — URINALYSIS, ROUTINE W REFLEX MICROSCOPIC
Bilirubin Urine: NEGATIVE
GLUCOSE, UA: NEGATIVE mg/dL
HGB URINE DIPSTICK: NEGATIVE
Ketones, ur: NEGATIVE mg/dL
LEUKOCYTES UA: NEGATIVE
Nitrite: NEGATIVE
PH: 6 (ref 5.0–8.0)
Protein, ur: NEGATIVE mg/dL
Specific Gravity, Urine: <1.005 — ABNORMAL LOW (ref 1.005–1.030)

## 2018-11-05 LAB — BASIC METABOLIC PANEL
ANION GAP: 13 (ref 5–15)
BUN: 6 mg/dL — ABNORMAL LOW (ref 8–23)
CHLORIDE: 100 mmol/L (ref 98–111)
CO2: 24 mmol/L (ref 22–32)
Calcium: 9 mg/dL (ref 8.9–10.3)
Creatinine, Ser: 0.7 mg/dL (ref 0.61–1.24)
GFR calc non Af Amer: >60 mL/min (ref >60)
GLUCOSE: 112 mg/dL — AB (ref 70–99)
POTASSIUM: 5 mmol/L (ref 3.5–5.1)
Sodium: 137 mmol/L (ref 135–145)

## 2018-11-05 LAB — MAGNESIUM: Magnesium: 1.8 mg/dL (ref 1.7–2.4)

## 2018-11-05 LAB — ETHANOL: Alcohol, Ethyl (B): 241 mg/dL — ABNORMAL HIGH (ref <10)

## 2018-11-05 MED ORDER — LORAZEPAM 2 MG/ML IJ SOLN
2.0000 mg | Freq: Once | INTRAMUSCULAR | Status: AC
  Administered 2018-11-05: 2 mg via INTRAVENOUS
  Filled 2018-11-05: qty 1

## 2018-11-05 MED ORDER — LORAZEPAM 2 MG/ML IJ SOLN
1.0000 mg | Freq: Once | INTRAMUSCULAR | Status: AC
  Administered 2018-11-05: 1 mg via INTRAVENOUS
  Filled 2018-11-05: qty 1

## 2018-11-05 MED ORDER — SODIUM CHLORIDE 0.9 % IV BOLUS
1000.0000 mL | Freq: Once | INTRAVENOUS | Status: AC
  Administered 2018-11-05: 1000 mL via INTRAVENOUS
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
## 2018-11-05 NOTE — ED Notes (Signed)
Pt to xray
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
## 2018-11-05 NOTE — ED Notes (Signed)
Nurse collecting labs. 

## 2018-11-05 NOTE — ED Notes (Signed)
Patient transported to X-ray 

## 2018-11-05 NOTE — ED Notes (Signed)
Patient transported to CT 

## 2018-11-05 NOTE — ED Notes (Signed)
Patient made statements he didn't want to live anymore, and he would sign everything over to male with him here tonight.

## 2018-11-06 ENCOUNTER — Emergency Department (HOSPITAL_COMMUNITY): Payer: Medicaid Other

## 2018-11-06 MED ORDER — VITAMIN B-1 100 MG PO TABS
100.0000 mg | ORAL_TABLET | Freq: Every day | ORAL | Status: DC
  Administered 2018-11-06: 100 mg via ORAL
  Filled 2018-11-06: qty 1

## 2018-11-06 MED ORDER — THIAMINE HCL 100 MG/ML IJ SOLN: 100.0000 mg | Freq: Every day | INTRAMUSCULAR | Status: DC

## 2018-11-06 MED ORDER — GABAPENTIN 300 MG PO CAPS: 300.0000 mg | ORAL_CAPSULE | Freq: Three times a day (TID) | ORAL | Status: DC

## 2018-11-06 MED ORDER — LORAZEPAM 1 MG PO TABS: 0.0000 mg | ORAL_TABLET | Freq: Four times a day (QID) | ORAL | Status: DC

## 2018-11-06 MED ORDER — LORAZEPAM 2 MG/ML IJ SOLN: 0.0000 mg | Freq: Four times a day (QID) | INTRAMUSCULAR | Status: DC

## 2018-11-06 MED ORDER — LORAZEPAM 1 MG PO TABS: 0.0000 mg | ORAL_TABLET | Freq: Two times a day (BID) | ORAL | Status: DC

## 2018-11-06 MED ORDER — THIAMINE HCL 100 MG/ML IJ SOLN
Freq: Once | INTRAVENOUS | Status: AC
  Administered 2018-11-06: 05:00:00 via INTRAVENOUS
  Filled 2018-11-06: qty 1000

## 2018-11-06 MED ORDER — LORAZEPAM 2 MG/ML IJ SOLN: 0.0000 mg | Freq: Two times a day (BID) | INTRAMUSCULAR | Status: DC

## 2018-11-06 NOTE — ED Notes (Signed)
TTS eval being done

## 2018-11-06 NOTE — ED Notes (Signed)
Property inventoried and placed with security (valuables) and in locker #5 in [place].

## 2018-11-06 NOTE — BH Assessment (Signed)
Received TTS consult request. RN states Pt is too somnolent to participate at this time.    Orpah Greek Anson Fret, LPC, [facility], [facility] Triage Specialist [phone]

## 2018-11-06 NOTE — ED Provider Notes (Signed)
Patient cleared by psychiatry.  Given resources for rehab by social work.  Discharged in good condition.   Lennice Sites, DO 11/06/18 1406

## 2018-11-06 NOTE — ED Notes (Signed)
Belongings given back to patient. Secured items also given back. Paper work completed
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
## 2018-11-06 NOTE — Progress Notes (Signed)
CSW consulted with attending physician regarding option of being hospitalized inpatient with [facility] for detoxification and noted patient did not meet criteria. CSW provided patient with resources on Alcohol Outpatient and Residential treatment options. CSW discussed options with the patient and encouraged him to follow through. CSW spoke to patient about [place] housing program and provided patient with contact information. CSW discussed long-term residential options with patient for substance use. CSW noted patient was conversing minimally with CSW but accepted copy of resource information. CSW signing off.  Lamonte Richer, LCSW, [place] Worker II [phone]

## 2018-11-06 NOTE — BH Assessment (Signed)
[facility] Assessment Progress Note    TTS attempting to assist with placement.  Patient's information was faxed to [facility], [place] in an attempt to obtain a detox bed for patient.

## 2018-11-06 NOTE — BH Assessment (Signed)
[facility] Assessment Progress Note   Patient was denied by [facility] due to his seizure history.  Most likely he will be too acute for [facility].  Have not heard back from [facility], message left with assessment office.

## 2018-11-06 NOTE — ED Notes (Signed)
See CSW note. This RN  Will speak with EDP for disposition.

## 2018-11-06 NOTE — ED Notes (Signed)
Pt verbalized understanding of discharge instructions and denies any further questions at this time.   

## 2018-11-22 ENCOUNTER — Emergency Department (HOSPITAL_COMMUNITY)
Admission: EM | Admit: 2018-11-22 | Discharge: 2018-11-23 | Discharge status: Against Medical Advice | Payer: Medicaid Other | Attending: Emergency Medicine | Admitting: Emergency Medicine

## 2018-11-22 DIAGNOSIS — Z8505 Personal history of malignant neoplasm of liver: Secondary | ICD-10-CM | POA: Insufficient documentation

## 2018-11-22 DIAGNOSIS — Z5320 Procedure and treatment not carried out because of patient's decision for unspecified reasons: Secondary | ICD-10-CM | POA: Diagnosis not present

## 2018-11-22 DIAGNOSIS — F10920 Alcohol use, unspecified with intoxication, uncomplicated: Secondary | ICD-10-CM | POA: Insufficient documentation

## 2018-11-22 DIAGNOSIS — F17210 Nicotine dependence, cigarettes, uncomplicated: Secondary | ICD-10-CM | POA: Diagnosis not present

## 2018-11-22 DIAGNOSIS — Z79899 Other long term (current) drug therapy: Secondary | ICD-10-CM | POA: Diagnosis not present

## 2018-11-22 NOTE — ED Provider Notes (Signed)
[facility] EMERGENCY DEPARTMENT Provider Note   CSN: [id number] Arrival date & time: 11/22/18  2007     History   Chief Complaint Chief Complaint  Patient presents with  . Alcohol Intoxication    HPI Benjamin Peterson is a 63 y.o. male.  HPI Patient presents after being found by bystanders, seemingly intoxicated. The patient gives a waxing, waning account of his current presentation, acknowledges drinking alcohol, states some concern for seizures No EMS report of seizures, the patient was seemingly found on the street, seemingly intoxicated. Before my evaluation the patient reportedly threw himself from the wheelchair several times, while in triage. He currently denies pain, states that he feels tired. A 5 caveat secondary to intoxication. Past Medical History:  Diagnosis Date  . Cancer ([place])    liver  . Hepatitis C   . Schizophrenia ([place])   . Seizures ([place])     There are no active problems to display for this patient.   No past surgical history on file.      Home Medications    Prior to Admission medications   Medication Sig Start Date End Date Taking? Authorizing Provider  phenytoin (DILANTIN) 100 MG ER capsule Take 1 capsule (100 mg total) by mouth 3 (three) times daily. 12/20/17   Molpus, John, MD  phenytoin (DILANTIN) 100 MG ER capsule Take 1 capsule (100 mg total) by mouth 3 (three) times daily. 09/06/18   Fredia Sorrow, MD    Family History No family history on file.  Social History Social History   Tobacco Use  . Smoking status: Heavy Tobacco Smoker    Packs/day: 1.00    Types: Cigarettes  . Smokeless tobacco: Never Used  Substance Use Topics  . Alcohol use: Yes    Comment: 2.5 gallons of liquor  . Drug use: No     Allergies   Patient has no known allergies.   Review of Systems Review of Systems  Unable to perform ROS: Other  Intoxication  Physical Exam Updated Vital Signs BP (!) 208/191 (BP Location: Right Arm)    Pulse 99   Temp 97.6 F (36.4 C) (Oral)   Resp 20   SpO2 98%   Physical Exam Vitals signs and nursing note reviewed.  Constitutional:      General: He is not in acute distress.    Appearance: He is well-developed.     Comments: Disheveled adult male with rambling speech, not following commands reliably  HENT:     Head: Normocephalic and atraumatic.  Eyes:     Conjunctiva/sclera: Conjunctivae normal.  Cardiovascular:     Rate and Rhythm: Normal rate and regular rhythm.     Pulses: Normal pulses.  Pulmonary:     Effort: Pulmonary effort is normal. No respiratory distress.     Breath sounds: No stridor.  Abdominal:     General: There is no distension.  Skin:    General: Skin is warm and dry.  Neurological:     Mental Status: He is alert.     Motor: Atrophy present. No tremor or seizure activity.  Psychiatric:        Cognition and Memory: Cognition is impaired.     Comments: Anxious      ED Treatments / Results  Labs (all labs ordered are listed, but only abnormal results are displayed) Labs Reviewed  ETHANOL    Procedures Procedures (including critical care time)  Medications Ordered in ED Medications - No data to display   Initial Impression /  Assessment and Plan / ED Course  I have reviewed the triage vital signs and the nursing notes.  Pertinent labs & imaging results that were available during my care of the patient were reviewed by me and considered in my medical decision making (see chart for details).    Chart review notable for multiple recent evaluations for acute alcohol intoxication, prior behavioral health evaluation.   11:57 PM [place], was not able to be located by clinical staff. Though the patient was awake and alert, speaking, was on the telephone, was in no distress, he did not stay for complete evaluation.   Final Clinical Impressions(s) / ED Diagnoses   Final diagnoses:  Acute alcoholic intoxication without complication [facility])       Carmin Muskrat, MD 11/22/18 2357

## 2018-11-22 NOTE — ED Triage Notes (Signed)
BIB EMS from streets. Pt was found with heavy ETOH on board. Pt has no complaints. Pt also threw himself out of the wheelchair in triage.

## 2018-11-22 NOTE — ED Notes (Signed)
Pt requesting urinal, appears to be trying to get out of bed, although denies. Pt stating he's fine and doesn't need anything else.

## 2018-11-25 ENCOUNTER — Other Ambulatory Visit: Payer: Self-pay

## 2018-11-25 ENCOUNTER — Encounter (HOSPITAL_COMMUNITY): Payer: Self-pay | Admitting: Emergency Medicine

## 2018-11-25 ENCOUNTER — Emergency Department (HOSPITAL_COMMUNITY)
Admission: EM | Admit: 2018-11-25 | Discharge: 2018-11-26 | Disposition: A | Discharge status: Home / Self Care | Payer: Medicaid Other | Attending: Emergency Medicine | Admitting: Emergency Medicine

## 2018-11-25 DIAGNOSIS — Z859 Personal history of malignant neoplasm, unspecified: Secondary | ICD-10-CM | POA: Diagnosis not present

## 2018-11-25 DIAGNOSIS — F1094 Alcohol use, unspecified with alcohol-induced mood disorder: Secondary | ICD-10-CM | POA: Diagnosis not present

## 2018-11-25 DIAGNOSIS — F1020 Alcohol dependence, uncomplicated: Secondary | ICD-10-CM | POA: Insufficient documentation

## 2018-11-25 DIAGNOSIS — F10920 Alcohol use, unspecified with intoxication, uncomplicated: Secondary | ICD-10-CM

## 2018-11-25 DIAGNOSIS — Z5900 Homelessness unspecified: Secondary | ICD-10-CM

## 2018-11-25 DIAGNOSIS — F17210 Nicotine dependence, cigarettes, uncomplicated: Secondary | ICD-10-CM | POA: Diagnosis not present

## 2018-11-25 DIAGNOSIS — R443 Hallucinations, unspecified: Secondary | ICD-10-CM | POA: Diagnosis present

## 2018-11-25 DIAGNOSIS — Z79899 Other long term (current) drug therapy: Secondary | ICD-10-CM | POA: Diagnosis not present

## 2018-11-25 DIAGNOSIS — F10229 Alcohol dependence with intoxication, unspecified: Secondary | ICD-10-CM | POA: Insufficient documentation
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
## 2018-11-26 ENCOUNTER — Emergency Department (HOSPITAL_COMMUNITY): Payer: Medicaid Other

## 2018-11-26 LAB — CBC WITH DIFFERENTIAL/PLATELET
Abs Immature Granulocytes: 0.01 10*3/uL (ref 0.00–0.07)
BASOS PCT: 0 %
Basophils Absolute: 0 10*3/uL (ref 0.0–0.1)
Eosinophils Absolute: 0.2 10*3/uL (ref 0.0–0.5)
Eosinophils Relative: 4 %
HCT: 39.3 % (ref 39.0–52.0)
Hemoglobin: 12.8 g/dL — ABNORMAL LOW (ref 13.0–17.0)
Immature Granulocytes: 0 %
Lymphocytes Relative: 50 %
Lymphs Abs: 2 10*3/uL (ref 0.7–4.0)
MCH: 31.7 pg (ref 26.0–34.0)
MCHC: 32.6 g/dL (ref 30.0–36.0)
MCV: 97.3 fL (ref 80.0–100.0)
Monocytes Absolute: 0.4 10*3/uL (ref 0.1–1.0)
Monocytes Relative: 10 %
Neutro Abs: 1.4 10*3/uL — ABNORMAL LOW (ref 1.7–7.7)
Neutrophils Relative %: 36 %
Platelets: 49 10*3/uL — ABNORMAL LOW (ref 150–400)
RBC: 4.04 MIL/uL — ABNORMAL LOW (ref 4.22–5.81)
RDW: 15.5 % (ref 11.5–15.5)
WBC: 4 10*3/uL (ref 4.0–10.5)
nRBC: 0 % (ref 0.0–0.2)

## 2018-11-26 LAB — COMPREHENSIVE METABOLIC PANEL
ALT: 49 U/L — ABNORMAL HIGH (ref 0–44)
ANION GAP: 7 (ref 5–15)
AST: 124 U/L — ABNORMAL HIGH (ref 15–41)
Albumin: 2.4 g/dL — ABNORMAL LOW (ref 3.5–5.0)
Alkaline Phosphatase: 70 U/L (ref 38–126)
BUN: 5 mg/dL — ABNORMAL LOW (ref 8–23)
CO2: 25 mmol/L (ref 22–32)
Calcium: 8.6 mg/dL — ABNORMAL LOW (ref 8.9–10.3)
Chloride: 111 mmol/L (ref 98–111)
Creatinine, Ser: 0.87 mg/dL (ref 0.61–1.24)
GFR calc Af Amer: >60 mL/min (ref >60)
GFR calc non Af Amer: >60 mL/min (ref >60)
Glucose, Bld: 112 mg/dL — ABNORMAL HIGH (ref 70–99)
Potassium: 3.8 mmol/L (ref 3.5–5.1)
Sodium: 143 mmol/L (ref 135–145)
TOTAL PROTEIN: 6.9 g/dL (ref 6.5–8.1)
Total Bilirubin: 1.4 mg/dL — ABNORMAL HIGH (ref 0.3–1.2)

## 2018-11-26 LAB — ETHANOL: Alcohol, Ethyl (B): 240 mg/dL — ABNORMAL HIGH (ref <10)

## 2018-11-26 LAB — RAPID URINE DRUG SCREEN, HOSP PERFORMED
Amphetamines: NOT DETECTED
Barbiturates: NOT DETECTED
Benzodiazepines: NOT DETECTED
Cocaine: NOT DETECTED
Opiates: NOT DETECTED
Tetrahydrocannabinol: NOT DETECTED

## 2018-11-26 LAB — PHENYTOIN LEVEL, TOTAL: Phenytoin Lvl: <2.5 ug/mL — ABNORMAL LOW (ref 10.0–20.0)

## 2018-11-26 MED ORDER — VITAMIN B-1 100 MG PO TABS: 100.0000 mg | ORAL_TABLET | Freq: Every day | ORAL | Status: DC

## 2018-11-26 MED ORDER — LORAZEPAM 2 MG/ML IJ SOLN
0.0000 mg | Freq: Four times a day (QID) | INTRAMUSCULAR | Status: DC
  Administered 2018-11-26: 1 mg via INTRAVENOUS
  Filled 2018-11-26: qty 1

## 2018-11-26 MED ORDER — SODIUM CHLORIDE 0.9 % IV BOLUS
1000.0000 mL | Freq: Once | INTRAVENOUS | Status: AC
  Administered 2018-11-26: 1000 mL via INTRAVENOUS

## 2018-11-26 MED ORDER — PHENYTOIN SODIUM EXTENDED 100 MG PO CAPS
100.0000 mg | ORAL_CAPSULE | Freq: Three times a day (TID) | ORAL | Status: DC
  Administered 2018-11-26: 100 mg via ORAL
  Filled 2018-11-26: qty 1

## 2018-11-26 MED ORDER — SODIUM CHLORIDE 0.9 % IV SOLN
1500.0000 mg | Freq: Once | INTRAVENOUS | Status: AC
  Administered 2018-11-26: 1500 mg via INTRAVENOUS
  Filled 2018-11-26: qty 30

## 2018-11-26 MED ORDER — LORAZEPAM 2 MG/ML IJ SOLN
1.0000 mg | Freq: Once | INTRAMUSCULAR | Status: AC
  Administered 2018-11-26: 1 mg via INTRAVENOUS
  Filled 2018-11-26: qty 1

## 2018-11-26 MED ORDER — THIAMINE HCL 100 MG/ML IJ SOLN: 100.0000 mg | Freq: Every day | INTRAMUSCULAR | Status: DC

## 2018-11-26 MED ORDER — LORAZEPAM 1 MG PO TABS: 0.0000 mg | ORAL_TABLET | Freq: Two times a day (BID) | ORAL | Status: DC

## 2018-11-26 MED ORDER — LORAZEPAM 2 MG/ML IJ SOLN: 0.0000 mg | Freq: Two times a day (BID) | INTRAMUSCULAR | Status: DC

## 2018-11-26 MED ORDER — LORAZEPAM 1 MG PO TABS: 0.0000 mg | ORAL_TABLET | Freq: Four times a day (QID) | ORAL | Status: DC

## 2018-11-26 NOTE — Progress Notes (Signed)
Nurse, Suezanne Jacquet reports that pt is not in any condition to be assessed at this time due to being intoxicated.

## 2018-11-26 NOTE — Progress Notes (Signed)
Patient is seen by me via tele-psych and I have consulted with Dr. Dwyane Dee.  Patient denies any suicidal homicidal ideations and denies any hallucinations.  Patient reports that he wants to stop abusing alcohol and is requesting detox.  He reports that in the past that when he has stopped drinking he usually has a seizure and is reported having 3 seizures before presenting to the hospital at this visit.  At this time patient does not meet criteria for psychiatric admission and is psychiatrically cleared.  I have contacted Dr. Sedonia Small and notified him of the recommendations and requested for Dr. Sedonia Small to review patient's chart to assess for a medical admission for detox due to his history of seizures.

## 2018-11-26 NOTE — ED Notes (Signed)
Patient transported to CT 

## 2018-11-26 NOTE — BH Assessment (Signed)
Tele Assessment Note   Patient Name: Benjamin Peterson MRN: [id number] Referring Physician: Duffy Bruce Location of Patient:   [facility] Location of Provider: [place]  Tyee Vandevoorde is an 63 y.o. male who presented to [facility] intoxicated stating that he had three seizures prior coming to the ED.  Patient also stated while intoxicated that he was suicidal, but did not identify a plan or intent. Patient states that he has never made any attempts to harm himself in the past.  He states that he never has thoughts of hurting others.  Patient states that his problem is due to his drinking.  Patient states that he has never been in treatment in the past for his alcoholism, but he states that he has been inpatient for his alcoholism, but states that he has been on an inpatient psych unit several years ago.  Patient states that he is currently not seeing an outpatient provider for any mental health treatment.  Patient states that he is no longer suicidal and states that he is able to contract for safety.  Patient states that he has been drinking two quarts of beer daily, but his BAL is 240 which suggests that he is minimizing his use of alcohol. Patient denies any history of abuse or self-mutilation.  Patient denies access to weapons.    Patient presents as oriented and alert.  He presents as disheveled. Patient is cooperative.  His thoughts are organized and his memory intact.  Patient maintained good eye contact and his speech was clear and coherent.  Patient's judgment, insight and impulse control are impaired.  His psycho-motor activity is restless.  He does not appear to be responding to any internal stimuli.  Patient is stating that he would like to get help for his alcohol problem.  With his history of seizures and his medical history, finding a detox bed outside of a hospital setting.  Patient is sgreeable to a medical hospital admission for detox if he is eligible and meets medical  inpatient criteria.   Diagnosis: F10.20 Alcohol Use Disorder Severe, F10.94 Alcohol induced Mood Disorder  Past Medical History:  Past Medical History:  Diagnosis Date  . Cancer ([place])    liver  . Hepatitis C   . Schizophrenia ([place])   . Seizures ([place])     History reviewed. No pertinent surgical history.  Family History: No family history on file.  Social History:  reports that he has been smoking cigarettes. He has been smoking about 1.00 pack per day. He has never used smokeless tobacco. He reports current alcohol use. He reports that he does not use drugs.  Additional Social History:  Alcohol / Drug Use Pain Medications: see MAR Prescriptions: see MAR Over the Counter: see MAR History of alcohol / drug use?: Yes Longest period of sobriety (when/how long): none reported Negative Consequences of Use: Financial, Personal relationships, Work / Youth worker, Scientist, research (physical sciences) Substance #1 Name of Substance 1: alcohol 1 - Age of First Use: 20 1 - Amount (size/oz): 2 quarts of beer  1 - Frequency: daily 1 - Duration: since onset 1 - Last Use / Amount: yesterday, unknown amount, BAL 240  CIWA: CIWA-Ar BP: 127/69 Pulse Rate: 94 Nausea and Vomiting: no nausea and no vomiting Tactile Disturbances: none Tremor: no tremor Auditory Disturbances: not present Paroxysmal Sweats: no sweat visible Visual Disturbances: not present Anxiety: no anxiety, at ease Headache, Fullness in Head: none present Agitation: normal activity Orientation and Clouding of Sensorium: oriented and can do serial additions  CIWA-Ar Total: 0 COWS:    Allergies: No Known Allergies  Home Medications: (Not in a hospital admission)   OB/GYN Status:  No LMP for male patient.  General Assessment Data Location of Assessment: [facility] ED TTS Assessment: In system Is this a Tele or Face-to-Face Assessment?: Face-to-Face Is this an Initial Assessment or a Re-assessment for this encounter?: Initial Assessment Patient Accompanied  by:: N/A Language Other than English: No Living Arrangements: Other (Comment)(states that he lives with his wife) What gender do you identify as?: Male Marital status: Single(pt states that he is married, but prior adm say he is not) Living Arrangements: Spouse/significant other Can pt return to current living arrangement?: Yes Admission Status: Voluntary Is patient capable of signing voluntary admission?: Yes Referral Source: Self/Family/Friend Insurance type: Banker)     Crisis Care Plan Living Arrangements: Spouse/significant other Legal Guardian: Other:(self) Name of Psychiatrist: (none) Name of Therapist: none  Education Status Is patient currently in school?: No Is the patient employed, unemployed or receiving disability?: Receiving disability income  Risk to self with the past 6 months Suicidal Ideation: Yes-Currently Present(suicidal thoughts while intoxicated, no plan/intent) Has patient been a risk to self within the past 6 months prior to admission? : No Suicidal Intent: No Has patient had any suicidal intent within the past 6 months prior to admission? : No Is patient at risk for suicide?: No Suicidal Plan?: No Has patient had any suicidal plan within the past 6 months prior to admission? : No Access to Means: No What has been your use of drugs/alcohol within the last 12 months?: yes, daily use of alcohol(no) Previous Attempts/Gestures: Yes How many times?: 1 Other Self Harm Risks: (alcohol use) Triggers for Past Attempts: None known Intentional Self Injurious Behavior: None Family Suicide History: No Recent stressful life event(s): Other (Comment)(none reported) Persecutory voices/beliefs?: No Depression: No Substance abuse history and/or treatment for substance abuse?: No Suicide prevention information given to non-admitted patients: Not applicable  Risk to Others within the past 6 months Homicidal Ideation: No Does patient have any lifetime risk of  violence toward others beyond the six months prior to admission? : No Thoughts of Harm to Others: No Current Homicidal Intent: No Current Homicidal Plan: No Access to Homicidal Means: No Identified Victim: none History of harm to others?: No Assessment of Violence: None Noted Violent Behavior Description: none Does patient have access to weapons?: No Criminal Charges Pending?: No Does patient have a court date: No Is patient on probation?: No  Psychosis Hallucinations: None noted Delusions: None noted  Mental Status Report Appearance/Hygiene: Disheveled Eye Contact: Good Motor Activity: Freedom of movement Speech: Logical/coherent Level of Consciousness: Alert Mood: Ambivalent Affect: Appropriate to circumstance Anxiety Level: Minimal Thought Processes: Coherent, Relevant Judgement: Impaired Orientation: Person, Place, Time, Situation Obsessive Compulsive Thoughts/Behaviors: None  Cognitive Functioning Concentration: Decreased Memory: Recent Intact, Remote Intact Is patient IDD: No Insight: Poor Impulse Control: Poor Appetite: Poor Have you had any weight changes? : Loss Amount of the weight change? (lbs): (30 lbs) Sleep: Decreased Total Hours of Sleep: (3) Vegetative Symptoms: Decreased grooming  ADLScreening [facility] Assessment Services) Patient's cognitive ability adequate to safely complete daily activities?: Yes Patient able to express need for assistance with ADLs?: Yes Independently performs ADLs?: Yes (appropriate for developmental age)  Prior Inpatient Therapy Prior Inpatient Therapy: No  Prior Outpatient Therapy Prior Outpatient Therapy: No Does patient have an ACCT team?: No Does patient have Intensive In-House Services?  : No Does patient have [facility] services? : No Does  patient have P4CC services?: No  ADL Screening (condition at time of admission) Patient's cognitive ability adequate to safely complete daily activities?: Yes Is the patient deaf  or have difficulty hearing?: No Does the patient have difficulty seeing, even when wearing glasses/contacts?: No Does the patient have difficulty concentrating, remembering, or making decisions?: No Patient able to express need for assistance with ADLs?: Yes Does the patient have difficulty dressing or bathing?: No Independently performs ADLs?: Yes (appropriate for developmental age) Does the patient have difficulty walking or climbing stairs?: No Weakness of Legs: None Weakness of Arms/Hands: None  Home Assistive Devices/Equipment Home Assistive Devices/Equipment: None  Therapy Consults (therapy consults require a physician order) PT Evaluation Needed: No OT Evalulation Needed: No SLP Evaluation Needed: No Abuse/Neglect Assessment (Assessment to be complete while patient is alone) Abuse/Neglect Assessment Can Be Completed: Yes Physical Abuse: Denies Verbal Abuse: Denies Sexual Abuse: Denies Exploitation of patient/patient's resources: Denies Self-Neglect: Denies Values / Beliefs Cultural Requests During Hospitalization: None Spiritual Requests During Hospitalization: None Consults Spiritual Care Consult Needed: No Social Work Consult Needed: No Regulatory affairs officer (For Healthcare) Does Patient Have a Medical Advance Directive?: No Would patient like information on creating a medical advance directive?: No - Patient declined Nutrition Screen- [facility] Adult/[facility]/AP Has the patient recently lost weight without trying?: Yes, 2-13 lbs. Has the patient been eating poorly because of a decreased appetite?: Yes Malnutrition Screening Tool Score: 2        Disposition: Per Marvia Pickles, NP, Patient does not meet inpatient psych admission and is cleared for D/C Disposition Initial Assessment Completed for this Encounter: Yes  This service was provided via telemedicine using a 2-way, interactive audio and video technology.  Names of all persons participating in this telemedicine service  and their role in this encounter. Name:  Kennieth Francois Role: patient  Name: Treyshon Buchanon Role: TTS  Name:  Role:   Name:  Role:     Judeth Porch Yovanna Cogan 11/26/2018 4:00 PM

## 2018-11-26 NOTE — ED Notes (Signed)
Dinner tray ordered for pt

## 2018-11-26 NOTE — Discharge Instructions (Addendum)
You were evaluated in the Emergency Department and after careful evaluation, we did not find any emergent condition requiring admission or further testing in the hospital.  If you are going to quit alcohol, it is important that you do it safely.  Please use the resources provided.  Please return to the Emergency Department if you experience any worsening of your condition.  We encourage you to follow up with a primary care provider.  Thank you for allowing [place] to be a part of your care.
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
## 2018-11-26 NOTE — ED Notes (Signed)
Diet tray ordered for pt 

## 2018-11-26 NOTE — ED Notes (Signed)
Breakfast tray ordered 

## 2018-11-26 NOTE — ED Provider Notes (Signed)
I was asked to evaluate the patient in response to temperature of 100.1 in the setting of alcohol intoxication.  Patient was able to wake easily, no meningismus, denied any infectious symptoms recently, no cough, no dysuria, no rash.  Given that this is not a true fever, will just monitor closely.  6:48 PM I was called by behavioral health, informed that patient does not meet any inpatient criteria for psychiatric care.  There was some question of the patient having seizures prior to coming to the emergency department for care.  Patient has a known seizure disorder and according to laboratory review, is quite subtherapeutic on his Dilantin, likely from noncompliance.  Patient is currently sleeping peacefully, wakes easily, no symptoms of active withdrawal.  No indication for medical admission, advised him to take his medications as directed, advised to follow-up with outpatient resources to quit using alcohol.  After the discussed management above, the patient was determined to be safe for discharge.  The patient was in agreement with this plan and all questions regarding their care were answered.  ED return precautions were discussed and the patient will return to the ED with any significant worsening of condition.   Maudie Flakes, MD 11/26/18 [phone]

## 2018-11-26 NOTE — ED Notes (Signed)
Pt being discharged, spoke with Blanch Media (pt's wife) st's she will be here in approx 1.5 hours.  Advised her that pt would be in ED lobby.

## 2018-11-26 NOTE — BH Assessment (Addendum)
[facility] Assessment Progress Note    TTS spoke to [place], Therapist, sports, patient's nurse, to try to attempt to complete his assessment.  She states that patient is still sleeping and difficult to arouse.  Suggested TTS attempt assessment a little later this morning.

## 2018-12-07 ENCOUNTER — Emergency Department (HOSPITAL_COMMUNITY)
Admission: EM | Admit: 2018-12-07 | Discharge: 2018-12-08 | Disposition: A | Discharge status: Home / Self Care | Payer: Medicaid Other | Attending: Emergency Medicine | Admitting: Emergency Medicine

## 2018-12-07 DIAGNOSIS — F1010 Alcohol abuse, uncomplicated: Secondary | ICD-10-CM | POA: Diagnosis present

## 2018-12-07 DIAGNOSIS — R251 Tremor, unspecified: Secondary | ICD-10-CM | POA: Insufficient documentation

## 2018-12-07 DIAGNOSIS — Z5321 Procedure and treatment not carried out due to patient leaving prior to being seen by health care provider: Secondary | ICD-10-CM | POA: Diagnosis not present

## 2018-12-07 NOTE — ED Triage Notes (Signed)
BIB EMS from [facility], pt is homeless [place]. Pt states he has been "seizing all day" Per EMS ETOH on board. Tremors noted in triage.

## 2018-12-08 NOTE — ED Notes (Signed)
This EMT checks for pt in green hall. Pt is not in hall. Not seen in lobby.

## 2019-01-03 ENCOUNTER — Encounter (HOSPITAL_COMMUNITY): Payer: Self-pay | Admitting: *Deleted

## 2019-01-03 ENCOUNTER — Other Ambulatory Visit: Payer: Self-pay

## 2019-01-03 ENCOUNTER — Emergency Department (HOSPITAL_COMMUNITY)
Admission: EM | Admit: 2019-01-03 | Discharge: 2019-01-04 | Disposition: A | Discharge status: Home / Self Care | Payer: Medicaid Other | Attending: Emergency Medicine | Admitting: Emergency Medicine

## 2019-01-03 DIAGNOSIS — W19XXXA Unspecified fall, initial encounter: Secondary | ICD-10-CM

## 2019-01-03 DIAGNOSIS — Z79899 Other long term (current) drug therapy: Secondary | ICD-10-CM | POA: Diagnosis not present

## 2019-01-03 DIAGNOSIS — F10929 Alcohol use, unspecified with intoxication, unspecified: Secondary | ICD-10-CM | POA: Diagnosis not present

## 2019-01-03 DIAGNOSIS — F17210 Nicotine dependence, cigarettes, uncomplicated: Secondary | ICD-10-CM | POA: Diagnosis not present

## 2019-01-03 DIAGNOSIS — G40909 Epilepsy, unspecified, not intractable, without status epilepticus: Secondary | ICD-10-CM | POA: Diagnosis present

## 2019-01-03 DIAGNOSIS — F10920 Alcohol use, unspecified with intoxication, uncomplicated: Secondary | ICD-10-CM

## 2019-01-03 DIAGNOSIS — Z23 Encounter for immunization: Secondary | ICD-10-CM | POA: Diagnosis not present
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
## 2019-01-04 ENCOUNTER — Emergency Department (HOSPITAL_COMMUNITY): Payer: Medicaid Other

## 2019-01-04 LAB — CBC WITH DIFFERENTIAL/PLATELET
Abs Immature Granulocytes: 0.04 10*3/uL (ref 0.00–0.07)
Basophils Absolute: 0 10*3/uL (ref 0.0–0.1)
Basophils Relative: 0 %
Eosinophils Absolute: 0.2 10*3/uL (ref 0.0–0.5)
Eosinophils Relative: 3 %
HCT: 45.7 % (ref 39.0–52.0)
Hemoglobin: 15 g/dL (ref 13.0–17.0)
Immature Granulocytes: 1 %
Lymphocytes Relative: 37 %
Lymphs Abs: 2.2 10*3/uL (ref 0.7–4.0)
MCH: 33 pg (ref 26.0–34.0)
MCHC: 32.8 g/dL (ref 30.0–36.0)
MCV: 100.4 fL — ABNORMAL HIGH (ref 80.0–100.0)
MONOS PCT: 10 %
Monocytes Absolute: 0.6 10*3/uL (ref 0.1–1.0)
NEUTROS PCT: 49 %
NRBC: 0 % (ref 0.0–0.2)
Neutro Abs: 3 10*3/uL (ref 1.7–7.7)
Platelets: 60 10*3/uL — ABNORMAL LOW (ref 150–400)
RBC: 4.55 MIL/uL (ref 4.22–5.81)
RDW: 15.1 % (ref 11.5–15.5)
WBC: 6.1 10*3/uL (ref 4.0–10.5)

## 2019-01-04 LAB — COMPREHENSIVE METABOLIC PANEL
ALT: 42 U/L (ref 0–44)
AST: 90 U/L — ABNORMAL HIGH (ref 15–41)
Albumin: 3.3 g/dL — ABNORMAL LOW (ref 3.5–5.0)
Alkaline Phosphatase: 115 U/L (ref 38–126)
Anion gap: 12 (ref 5–15)
BILIRUBIN TOTAL: 2.1 mg/dL — AB (ref 0.3–1.2)
BUN: 8 mg/dL (ref 8–23)
CHLORIDE: 104 mmol/L (ref 98–111)
CO2: 21 mmol/L — ABNORMAL LOW (ref 22–32)
Calcium: 8.9 mg/dL (ref 8.9–10.3)
Creatinine, Ser: 0.63 mg/dL (ref 0.61–1.24)
GFR calc Af Amer: >60 mL/min (ref >60)
GFR calc non Af Amer: >60 mL/min (ref >60)
Glucose, Bld: 108 mg/dL — ABNORMAL HIGH (ref 70–99)
POTASSIUM: 3.6 mmol/L (ref 3.5–5.1)
Sodium: 137 mmol/L (ref 135–145)
TOTAL PROTEIN: 8.7 g/dL — AB (ref 6.5–8.1)

## 2019-01-04 LAB — RAPID URINE DRUG SCREEN, HOSP PERFORMED
Amphetamines: NOT DETECTED
Barbiturates: NOT DETECTED
Benzodiazepines: NOT DETECTED
Cocaine: NOT DETECTED
Opiates: NOT DETECTED
TETRAHYDROCANNABINOL: NOT DETECTED

## 2019-01-04 LAB — PHENYTOIN LEVEL, TOTAL: Phenytoin Lvl: <2.5 ug/mL — ABNORMAL LOW (ref 10.0–20.0)

## 2019-01-04 LAB — SALICYLATE LEVEL: Salicylate Lvl: <7 mg/dL (ref 2.8–30.0)

## 2019-01-04 LAB — ACETAMINOPHEN LEVEL: Acetaminophen (Tylenol), Serum: <10 ug/mL — ABNORMAL LOW (ref 10–30)

## 2019-01-04 LAB — ETHANOL: ALCOHOL ETHYL (B): 331 mg/dL — AB (ref <10)

## 2019-01-04 MED ORDER — TETANUS-DIPHTH-ACELL PERTUSSIS 5-2.5-18.5 LF-MCG/0.5 IM SUSP
0.5000 mL | Freq: Once | INTRAMUSCULAR | Status: AC
  Administered 2019-01-04: 0.5 mL via INTRAMUSCULAR
  Filled 2019-01-04: qty 0.5

## 2019-01-04 MED ORDER — PHENYTOIN SODIUM EXTENDED 100 MG PO CAPS: 100.0000 mg | ORAL_CAPSULE | Freq: Three times a day (TID) | ORAL | 0 refills | Status: DC

## 2019-01-04 MED ORDER — PHENYTOIN SODIUM EXTENDED 100 MG PO CAPS
100.0000 mg | ORAL_CAPSULE | Freq: Once | ORAL | Status: AC
  Administered 2019-01-04: 100 mg via ORAL
  Filled 2019-01-04: qty 1

## 2019-01-04 NOTE — ED Notes (Signed)
Pt discharged from ED; instructions provided; Pt encouraged to return to ED if symptoms worsen and to f/u with PCP; Pt verbalized understanding of all instructions 

## 2019-01-04 NOTE — ED Provider Notes (Signed)
[facility] EMERGENCY DEPARTMENT Provider Note   CSN: [id number] Arrival date & time: 01/03/19  2339    History   Chief Complaint Chief Complaint  Patient presents with  . Head Injury    HPI Cicero Noy is a 63 y.o. male.     Patient with past medical history markable for schizophrenia, seizure disorder, hepatitis C and liver cancer, presents to the emergency department with a chief complaint of head injury.  He arrives by EMS from the [facility].  Was reported to have seizure-like activity and fell and hit his head.  Level 5 caveat applies 2/2 altered mental status.  The history is provided by the patient. No language interpreter was used.    Past Medical History:  Diagnosis Date  . Cancer ([place])    liver  . Hepatitis C   . Schizophrenia ([place])   . Seizures ([place])     There are no active problems to display for this patient.   History reviewed. No pertinent surgical history.      Home Medications    Prior to Admission medications   Medication Sig Start Date End Date Taking? Authorizing Provider  phenytoin (DILANTIN) 100 MG ER capsule Take 1 capsule (100 mg total) by mouth 3 (three) times daily. Patient not taking: Reported on 11/26/2018 12/20/17   Molpus, Jenny Reichmann, MD  phenytoin (DILANTIN) 100 MG ER capsule Take 1 capsule (100 mg total) by mouth 3 (three) times daily. Patient not taking: Reported on 11/26/2018 09/06/18   Fredia Sorrow, MD    Family History No family history on file.  Social History Social History   Tobacco Use  . Smoking status: Heavy Tobacco Smoker    Packs/day: 1.00    Types: Cigarettes  . Smokeless tobacco: Never Used  Substance Use Topics  . Alcohol use: Yes    Comment: 2.5 gallons of liquor  . Drug use: No     Allergies   Patient has no known allergies.   Review of Systems Review of Systems  Unable to perform ROS: Mental status change     Physical Exam Updated Vital Signs BP 123/76 (BP Location: Right Arm)    Pulse 70   Temp (!) 97.5 F (36.4 C) (Oral)   Resp 18   SpO2 97%   Physical Exam Vitals signs and nursing note reviewed.  Constitutional:      Appearance: He is well-developed.  HENT:     Head: Normocephalic and atraumatic.     Comments: Hematoma to left forehead Eyes:     General: No scleral icterus.       Right eye: No discharge.        Left eye: No discharge.     Conjunctiva/sclera: Conjunctivae normal.     Pupils: Pupils are equal, round, and reactive to light.  Neck:     Musculoskeletal: Normal range of motion and neck supple.     Vascular: No JVD.  Cardiovascular:     Rate and Rhythm: Normal rate and regular rhythm.     Heart sounds: Normal heart sounds. No murmur. No friction rub. No gallop.   Pulmonary:     Effort: Pulmonary effort is normal. No respiratory distress.     Breath sounds: Normal breath sounds. No wheezing or rales.  Chest:     Chest wall: No tenderness.  Abdominal:     General: There is no distension.     Palpations: Abdomen is soft. There is no mass.     Tenderness: There is  no abdominal tenderness. There is no guarding or rebound.  Musculoskeletal: Normal range of motion.        General: No tenderness.     Comments: Moves all extremities  Skin:    General: Skin is warm and dry.  Neurological:     Mental Status: He is alert and oriented to person, place, and time.  Psychiatric:        Behavior: Behavior normal.        Thought Content: Thought content normal.        Judgment: Judgment normal.      ED Treatments / Results  Labs (all labs ordered are listed, but only abnormal results are displayed) Labs Reviewed  CBC WITH DIFFERENTIAL/PLATELET  COMPREHENSIVE METABOLIC PANEL  ETHANOL  ACETAMINOPHEN LEVEL  SALICYLATE LEVEL  RAPID URINE DRUG SCREEN, HOSP PERFORMED  PHENYTOIN LEVEL, TOTAL    EKG None  Radiology No results found.  Procedures Procedures (including critical care time)  Medications Ordered in ED Medications  Tdap  (BOOSTRIX) injection 0.5 mL (has no administration in time range)     Initial Impression / Assessment and Plan / ED Course  I have reviewed the triage vital signs and the nursing notes.  Pertinent labs & imaging results that were available during my care of the patient were reviewed by me and considered in my medical decision making (see chart for details).        Patient with history of seizures.  Reportedly had seizure-like activity today and fell and hit his head.  He takes phenytoin.  Unclear about compliance.  Patient also seems intoxicated.  Will check labs and CT head.  CT head and neck negative for acute traumatic findings.  Labs reassuring.  ETOH is high.  Doubt alcohol withdrawal.  Not post-ictal, doubt that he had a seizure, but will refill his phenytoin.  PCP follow-up.   Final Clinical Impressions(s) / ED Diagnoses   Final diagnoses:  Fall, initial encounter  Alcoholic intoxication without complication [facility])    ED Discharge Orders    None       Montine Circle, PA-C 01/04/19 9983    Ward, Delice Bison, DO 01/04/19 [phone]

## 2019-02-07 NOTE — ED Provider Notes (Signed)
EKG Interpretation  Date/Time:  Tuesday January 04 2019 00:25:37 EST Ventricular Rate:  72 PR Interval:    QRS Duration: 93 QT Interval:  438 QTC Calculation: 480 R Axis:   -8 Text Interpretation:  Sinus rhythm Abnormal R-wave progression, early transition Borderline prolonged QT interval ----------unconfirmed---------- Confirmed by UNCONFIRMED, DOCTOR ([id number]), editor Frederich Cha [phone]) on 01/04/2019 8:55:06 AM         Montine Circle, PA-C 02/07/19 2243    Ward, Delice Bison, DO 02/08/19 1920

## 2019-02-20 ENCOUNTER — Encounter (HOSPITAL_COMMUNITY): Payer: Self-pay

## 2019-02-20 ENCOUNTER — Other Ambulatory Visit: Payer: Self-pay

## 2019-02-20 ENCOUNTER — Emergency Department (HOSPITAL_COMMUNITY): Payer: Medicaid Other

## 2019-02-20 ENCOUNTER — Emergency Department (HOSPITAL_COMMUNITY)
Admission: EM | Admit: 2019-02-20 | Discharge: 2019-02-21 | Disposition: A | Discharge status: Home / Self Care | Payer: Medicaid Other | Attending: Emergency Medicine | Admitting: Emergency Medicine

## 2019-02-20 DIAGNOSIS — B1710 Acute hepatitis C without hepatic coma: Secondary | ICD-10-CM | POA: Diagnosis not present

## 2019-02-20 DIAGNOSIS — R4182 Altered mental status, unspecified: Secondary | ICD-10-CM | POA: Diagnosis not present

## 2019-02-20 DIAGNOSIS — W1830XA Fall on same level, unspecified, initial encounter: Secondary | ICD-10-CM | POA: Diagnosis not present

## 2019-02-20 DIAGNOSIS — Y908 Blood alcohol level of 240 mg/100 ml or more: Secondary | ICD-10-CM | POA: Insufficient documentation

## 2019-02-20 DIAGNOSIS — F17210 Nicotine dependence, cigarettes, uncomplicated: Secondary | ICD-10-CM | POA: Diagnosis not present

## 2019-02-20 DIAGNOSIS — F10929 Alcohol use, unspecified with intoxication, unspecified: Secondary | ICD-10-CM | POA: Insufficient documentation

## 2019-02-20 DIAGNOSIS — Z8505 Personal history of malignant neoplasm of liver: Secondary | ICD-10-CM | POA: Diagnosis not present

## 2019-02-20 DIAGNOSIS — F209 Schizophrenia, unspecified: Secondary | ICD-10-CM | POA: Diagnosis not present

## 2019-02-20 LAB — URINALYSIS, ROUTINE W REFLEX MICROSCOPIC
Bilirubin Urine: NEGATIVE
Glucose, UA: NEGATIVE mg/dL
Hgb urine dipstick: NEGATIVE
Ketones, ur: NEGATIVE mg/dL
Leukocytes,Ua: NEGATIVE
Nitrite: NEGATIVE
Protein, ur: NEGATIVE mg/dL
Specific Gravity, Urine: 1.005 (ref 1.005–1.030)
pH: 5 (ref 5.0–8.0)

## 2019-02-20 LAB — COMPREHENSIVE METABOLIC PANEL
ALT: 47 U/L — ABNORMAL HIGH (ref 0–44)
AST: 113 U/L — ABNORMAL HIGH (ref 15–41)
Albumin: 2.9 g/dL — ABNORMAL LOW (ref 3.5–5.0)
Alkaline Phosphatase: 82 U/L (ref 38–126)
Anion gap: 9 (ref 5–15)
BUN: 8 mg/dL (ref 8–23)
CO2: 22 mmol/L (ref 22–32)
Calcium: 8.4 mg/dL — ABNORMAL LOW (ref 8.9–10.3)
Chloride: 108 mmol/L (ref 98–111)
Creatinine, Ser: 0.72 mg/dL (ref 0.61–1.24)
GFR calc Af Amer: >60 mL/min (ref >60)
GFR calc non Af Amer: >60 mL/min (ref >60)
Glucose, Bld: 94 mg/dL (ref 70–99)
Potassium: 3.8 mmol/L (ref 3.5–5.1)
Sodium: 139 mmol/L (ref 135–145)
Total Bilirubin: 1.4 mg/dL — ABNORMAL HIGH (ref 0.3–1.2)
Total Protein: 7.1 g/dL (ref 6.5–8.1)

## 2019-02-20 LAB — CBG MONITORING, ED: Glucose-Capillary: 83 mg/dL (ref 70–99)

## 2019-02-20 LAB — CBC WITH DIFFERENTIAL/PLATELET
Abs Immature Granulocytes: 0.01 10*3/uL (ref 0.00–0.07)
Basophils Absolute: 0 10*3/uL (ref 0.0–0.1)
Basophils Relative: 1 %
Eosinophils Absolute: 0.2 10*3/uL (ref 0.0–0.5)
Eosinophils Relative: 4 %
HCT: 41.9 % (ref 39.0–52.0)
Hemoglobin: 13.6 g/dL (ref 13.0–17.0)
Immature Granulocytes: 0 %
Lymphocytes Relative: 47 %
Lymphs Abs: 2.3 10*3/uL (ref 0.7–4.0)
MCH: 32.1 pg (ref 26.0–34.0)
MCHC: 32.5 g/dL (ref 30.0–36.0)
MCV: 98.8 fL (ref 80.0–100.0)
Monocytes Absolute: 0.5 10*3/uL (ref 0.1–1.0)
Monocytes Relative: 10 %
Neutro Abs: 1.8 10*3/uL (ref 1.7–7.7)
Neutrophils Relative %: 38 %
Platelets: 65 10*3/uL — ABNORMAL LOW (ref 150–400)
RBC: 4.24 MIL/uL (ref 4.22–5.81)
RDW: 14.1 % (ref 11.5–15.5)
WBC: 4.8 10*3/uL (ref 4.0–10.5)
nRBC: 0 % (ref 0.0–0.2)

## 2019-02-20 LAB — ETHANOL: Alcohol, Ethyl (B): 332 mg/dL (ref <10)

## 2019-02-20 MED ORDER — THIAMINE HCL 100 MG/ML IJ SOLN
Freq: Once | INTRAVENOUS | Status: AC
  Administered 2019-02-20: 20:00:00 via INTRAVENOUS
  Filled 2019-02-20: qty 1000

## 2019-02-20 NOTE — ED Notes (Signed)
O2 placed on patient while sleeping due to him continuing to drop sats into low 80's. On 2L Nasal cannula

## 2019-02-20 NOTE — ED Notes (Signed)
Critical lab etoh 332 - PA notified
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
## 2019-02-20 NOTE — ED Notes (Signed)
Patient transported to CT 
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
## 2019-02-21 NOTE — ED Notes (Signed)
Ambulated patient, patient had no complaints at this time.

## 2019-03-23 ENCOUNTER — Emergency Department (HOSPITAL_COMMUNITY): Payer: Medicaid Other

## 2019-03-23 ENCOUNTER — Emergency Department (HOSPITAL_COMMUNITY)
Admission: EM | Admit: 2019-03-23 | Discharge: 2019-03-24 | Disposition: A | Discharge status: Home / Self Care | Payer: Medicaid Other | Attending: Emergency Medicine | Admitting: Emergency Medicine

## 2019-03-23 ENCOUNTER — Encounter (HOSPITAL_COMMUNITY): Payer: Self-pay | Admitting: Emergency Medicine

## 2019-03-23 DIAGNOSIS — F17210 Nicotine dependence, cigarettes, uncomplicated: Secondary | ICD-10-CM | POA: Diagnosis not present

## 2019-03-23 DIAGNOSIS — Y939 Activity, unspecified: Secondary | ICD-10-CM | POA: Insufficient documentation

## 2019-03-23 DIAGNOSIS — Y908 Blood alcohol level of 240 mg/100 ml or more: Secondary | ICD-10-CM | POA: Diagnosis not present

## 2019-03-23 DIAGNOSIS — S2232XA Fracture of one rib, left side, initial encounter for closed fracture: Secondary | ICD-10-CM | POA: Diagnosis not present

## 2019-03-23 DIAGNOSIS — F10920 Alcohol use, unspecified with intoxication, uncomplicated: Secondary | ICD-10-CM | POA: Diagnosis not present

## 2019-03-23 DIAGNOSIS — Y999 Unspecified external cause status: Secondary | ICD-10-CM | POA: Diagnosis not present

## 2019-03-23 DIAGNOSIS — S20302A Unspecified superficial injuries of left front wall of thorax, initial encounter: Secondary | ICD-10-CM | POA: Diagnosis present

## 2019-03-23 DIAGNOSIS — S0181XA Laceration without foreign body of other part of head, initial encounter: Secondary | ICD-10-CM | POA: Diagnosis not present

## 2019-03-23 DIAGNOSIS — Y929 Unspecified place or not applicable: Secondary | ICD-10-CM | POA: Diagnosis not present

## 2019-03-23 LAB — CBC WITH DIFFERENTIAL/PLATELET
Abs Immature Granulocytes: 0.01 10*3/uL (ref 0.00–0.07)
Basophils Absolute: 0 10*3/uL (ref 0.0–0.1)
Basophils Relative: 1 %
Eosinophils Absolute: 0.2 10*3/uL (ref 0.0–0.5)
Eosinophils Relative: 4 %
HCT: 43.2 % (ref 39.0–52.0)
Hemoglobin: 14.7 g/dL (ref 13.0–17.0)
Immature Granulocytes: 0 %
Lymphocytes Relative: 36 %
Lymphs Abs: 2 10*3/uL (ref 0.7–4.0)
MCH: 33.7 pg (ref 26.0–34.0)
MCHC: 34 g/dL (ref 30.0–36.0)
MCV: 99.1 fL (ref 80.0–100.0)
Monocytes Absolute: 0.7 10*3/uL (ref 0.1–1.0)
Monocytes Relative: 13 %
Neutro Abs: 2.6 10*3/uL (ref 1.7–7.7)
Neutrophils Relative %: 46 %
Platelets: 68 10*3/uL — ABNORMAL LOW (ref 150–400)
RBC: 4.36 MIL/uL (ref 4.22–5.81)
RDW: 13.9 % (ref 11.5–15.5)
WBC: 5.5 10*3/uL (ref 4.0–10.5)
nRBC: 0 % (ref 0.0–0.2)

## 2019-03-23 LAB — CBG MONITORING, ED: Glucose-Capillary: 95 mg/dL (ref 70–99)

## 2019-03-23 LAB — URINALYSIS, ROUTINE W REFLEX MICROSCOPIC
Bilirubin Urine: NEGATIVE
Glucose, UA: NEGATIVE mg/dL
Hgb urine dipstick: NEGATIVE
Ketones, ur: NEGATIVE mg/dL
Leukocytes,Ua: NEGATIVE
Nitrite: NEGATIVE
Protein, ur: NEGATIVE mg/dL
Specific Gravity, Urine: 1.001 — ABNORMAL LOW (ref 1.005–1.030)
pH: 6 (ref 5.0–8.0)

## 2019-03-23 LAB — COMPREHENSIVE METABOLIC PANEL
ALT: 41 U/L (ref 0–44)
AST: 90 U/L — ABNORMAL HIGH (ref 15–41)
Albumin: 3.3 g/dL — ABNORMAL LOW (ref 3.5–5.0)
Alkaline Phosphatase: 99 U/L (ref 38–126)
Anion gap: 7 (ref 5–15)
BUN: 7 mg/dL — ABNORMAL LOW (ref 8–23)
CO2: 25 mmol/L (ref 22–32)
Calcium: 8.2 mg/dL — ABNORMAL LOW (ref 8.9–10.3)
Chloride: 104 mmol/L (ref 98–111)
Creatinine, Ser: 0.53 mg/dL — ABNORMAL LOW (ref 0.61–1.24)
GFR calc Af Amer: >60 mL/min (ref >60)
GFR calc non Af Amer: >60 mL/min (ref >60)
Glucose, Bld: 100 mg/dL — ABNORMAL HIGH (ref 70–99)
Potassium: 3.4 mmol/L — ABNORMAL LOW (ref 3.5–5.1)
Sodium: 136 mmol/L (ref 135–145)
Total Bilirubin: 2.4 mg/dL — ABNORMAL HIGH (ref 0.3–1.2)
Total Protein: 7.9 g/dL (ref 6.5–8.1)

## 2019-03-23 LAB — AMMONIA: Ammonia: 59 umol/L — ABNORMAL HIGH (ref 9–35)

## 2019-03-23 LAB — ETHANOL: Alcohol, Ethyl (B): 332 mg/dL (ref <10)

## 2019-03-23 MED ORDER — IOHEXOL 300 MG/ML  SOLN
100.0000 mL | Freq: Once | INTRAMUSCULAR | Status: AC | PRN
  Administered 2019-03-23: 100 mL via INTRAVENOUS
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
## 2019-03-23 NOTE — ED Notes (Signed)
Pt undressed and belongings are at bedside, including his wallet.

## 2019-03-23 NOTE — ED Notes (Signed)
cbg 95 
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
## 2019-03-23 NOTE — ED Notes (Signed)
Urinal given
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
## 2019-03-24 NOTE — ED Notes (Signed)
Pt soiled the bed, linens changed and pt provided with peri care.

## 2019-03-24 NOTE — ED Notes (Signed)
Pt urinated on floor  adls performed

## 2019-03-24 NOTE — ED Notes (Signed)
Pt given water, tolerating well

## 2019-03-24 NOTE — ED Notes (Signed)
Pt soiled the bed, pt cleaned and given new linens.

## 2019-03-24 NOTE — ED Notes (Signed)
Pt given new urinal.

## 2019-03-24 NOTE — ED Notes (Addendum)
Attempted to ambulate pt but pt refuses stating "he cannot walk". Pt sat up in the bed with no assistance but pt then stated "I can walk it just hurts too bad". Lattie Haw, EDPA made aware.

## 2019-03-24 NOTE — ED Notes (Signed)
Pt urinated on floor.

## 2019-03-24 NOTE — Discharge Instructions (Addendum)
Stop drinking alcohol. Can take tylenol or motrin for pain. Follow-up with your primary care doctor.

## 2019-03-24 NOTE — ED Notes (Signed)
Patient given discharge teaching and verbalized understanding. Patient was ambulated out of ED w/ steady gate. Patient ambulated out of ED w/ ED staff to assist w/ showing the patient directions.

## 2019-04-16 ENCOUNTER — Other Ambulatory Visit: Payer: Self-pay

## 2019-04-16 ENCOUNTER — Ambulatory Visit (HOSPITAL_COMMUNITY)
Admission: AD | Admit: 2019-04-16 | Discharge: 2019-04-16 | Disposition: A | Discharge status: Home / Self Care | Payer: Medicaid Other | Attending: Psychiatry | Admitting: Psychiatry

## 2019-04-16 ENCOUNTER — Emergency Department (HOSPITAL_COMMUNITY)
Admission: EM | Admit: 2019-04-16 | Discharge: 2019-04-17 | Disposition: A | Discharge status: Home / Self Care | Payer: Medicaid Other | Attending: Emergency Medicine | Admitting: Emergency Medicine

## 2019-04-16 ENCOUNTER — Encounter (HOSPITAL_COMMUNITY): Payer: Self-pay | Admitting: Emergency Medicine

## 2019-04-16 DIAGNOSIS — F1010 Alcohol abuse, uncomplicated: Secondary | ICD-10-CM | POA: Insufficient documentation

## 2019-04-16 DIAGNOSIS — R45851 Suicidal ideations: Secondary | ICD-10-CM | POA: Insufficient documentation

## 2019-04-16 DIAGNOSIS — F209 Schizophrenia, unspecified: Secondary | ICD-10-CM | POA: Diagnosis not present

## 2019-04-16 DIAGNOSIS — F17210 Nicotine dependence, cigarettes, uncomplicated: Secondary | ICD-10-CM | POA: Diagnosis not present

## 2019-04-16 DIAGNOSIS — F1014 Alcohol abuse with alcohol-induced mood disorder: Secondary | ICD-10-CM | POA: Diagnosis not present

## 2019-04-16 DIAGNOSIS — R569 Unspecified convulsions: Secondary | ICD-10-CM | POA: Diagnosis not present

## 2019-04-16 DIAGNOSIS — Z0489 Encounter for examination and observation for other specified reasons: Secondary | ICD-10-CM | POA: Diagnosis present

## 2019-04-16 MED ORDER — THIAMINE HCL 100 MG/ML IJ SOLN: 100.0000 mg | Freq: Every day | INTRAMUSCULAR | Status: DC

## 2019-04-16 MED ORDER — NICOTINE 14 MG/24HR TD PT24
14.0000 mg | MEDICATED_PATCH | Freq: Every day | TRANSDERMAL | Status: DC
  Filled 2019-04-16: qty 1

## 2019-04-16 MED ORDER — LORAZEPAM 2 MG/ML IJ SOLN: 0.0000 mg | Freq: Two times a day (BID) | INTRAMUSCULAR | Status: DC

## 2019-04-16 MED ORDER — LORAZEPAM 1 MG PO TABS: 0.0000 mg | ORAL_TABLET | Freq: Two times a day (BID) | ORAL | Status: DC

## 2019-04-16 MED ORDER — LORAZEPAM 1 MG PO TABS
0.0000 mg | ORAL_TABLET | Freq: Four times a day (QID) | ORAL | Status: DC
  Administered 2019-04-17: 1 mg via ORAL
  Filled 2019-04-16: qty 1

## 2019-04-16 MED ORDER — LORAZEPAM 2 MG/ML IJ SOLN: 0.0000 mg | Freq: Four times a day (QID) | INTRAMUSCULAR | Status: DC

## 2019-04-16 MED ORDER — VITAMIN B-1 100 MG PO TABS
100.0000 mg | ORAL_TABLET | Freq: Every day | ORAL | Status: DC
  Administered 2019-04-17: 100 mg via ORAL
  Filled 2019-04-16: qty 1
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
## 2019-04-16 NOTE — ED Notes (Signed)
Bed: LP53 Expected date:  Expected time:  Means of arrival:  Comments: EMS from [facility]-PTSD/[place]-hx seizures

## 2019-04-16 NOTE — ED Provider Notes (Signed)
[facility] DEPT Provider Note   CSN: [id number] Arrival date & time: 04/16/19  2257    History   Chief Complaint No chief complaint on file.   HPI Tamika Nou is a 63 y.o. male.     The history is provided by the patient.  Mental Health Problem  Presenting symptoms: suicidal thoughts   Degree of incapacity (severity):  Severe Onset quality:  Sudden Timing:  Constant Progression:  Worsening Chronicity:  New Relieved by:  None tried Worsened by:  Nothing Patient history of hepatitis, schizophrenia, seizures presents from behavioral health.  Patient was first brought to behavioral health and transferred to our hospital.  He reported a plan to harm himself with a gun. He reports he had a seizure recently.  He reports he drank 2 beers today.  Past Medical History:  Diagnosis Date  . Cancer ([place])    liver  . Hepatitis C   . Schizophrenia ([place])   . Seizures ([place])     There are no active problems to display for this patient.   History reviewed. No pertinent surgical history.      Home Medications    Prior to Admission medications   Medication Sig Start Date End Date Taking? Authorizing Provider  phenytoin (DILANTIN) 100 MG ER capsule Take 1 capsule (100 mg total) by mouth 3 (three) times daily. Patient not taking: Reported on 03/23/2019 01/04/19   Montine Circle, PA-C    Family History History reviewed. No pertinent family history.  Social History Social History   Tobacco Use  . Smoking status: Heavy Tobacco Smoker    Packs/day: 1.00    Types: Cigarettes  . Smokeless tobacco: Never Used  Substance Use Topics  . Alcohol use: Yes    Comment: 2.5 gallons of liquor  . Drug use: No     Allergies   Patient has no known allergies.   Review of Systems Review of Systems  Constitutional: Negative for fever.  Neurological: Positive for seizures.  Psychiatric/Behavioral: Positive for suicidal ideas.  All other systems  reviewed and are negative.    Physical Exam Updated Vital Signs BP 121/83 (BP Location: Left Arm)   Pulse 84   Temp 98.3 F (36.8 C) (Oral)   Resp 20   SpO2 95%   Physical Exam CONSTITUTIONAL: Disheveled, no acute distress HEAD: Normocephalic/atraumatic EYES: EOMI/PERRL ENMT: Mucous membranes moist NECK: supple no meningeal signs SPINE/BACK:entire spine nontender CV: S1/S2 noted, no murmurs/rubs/gallops noted LUNGS: Lungs are clear to auscultation bilaterally, no apparent distress ABDOMEN: soft, nontender, no rebound or guarding, bowel sounds noted throughout abdomen GU:no cva tenderness NEURO: Pt is awake/alert/appropriate, moves all extremitiesx4.  No facial droop.  Mild tremor to his hands.  Patient answers questions appropriately EXTREMITIES: pulses normal/equal, full ROM SKIN: warm, color normal PSYCH: no abnormalities of mood noted, alert and oriented to situation   ED Treatments / Results  Labs (all labs ordered are listed, but only abnormal results are displayed) Labs Reviewed  CBC WITH DIFFERENTIAL/PLATELET - Abnormal; Notable for the following components:      Result Value   Platelets 72 (*)    All other components within normal limits  COMPREHENSIVE METABOLIC PANEL - Abnormal; Notable for the following components:   Glucose, Bld 101 (*)    BUN 7 (*)    Calcium 8.7 (*)    Albumin 3.2 (*)    AST 97 (*)    ALT 53 (*)    Total Bilirubin 1.7 (*)    All  other components within normal limits  ETHANOL - Abnormal; Notable for the following components:   Alcohol, Ethyl (B) 188 (*)    All other components within normal limits  PHENYTOIN LEVEL, TOTAL - Abnormal; Notable for the following components:   Phenytoin Lvl <2.5 (*)    All other components within normal limits  RAPID URINE DRUG SCREEN, HOSP PERFORMED    EKG None  Radiology No results found.  Procedures Procedures    Medications Ordered in ED Medications  LORazepam (ATIVAN) injection 0-4 mg (  Intravenous See Alternative 04/17/19 0019)    Or  LORazepam (ATIVAN) tablet 0-4 mg (1 mg Oral Given 04/17/19 0019)  LORazepam (ATIVAN) injection 0-4 mg (has no administration in time range)    Or  LORazepam (ATIVAN) tablet 0-4 mg (has no administration in time range)  thiamine (VITAMIN B-1) tablet 100 mg (has no administration in time range)    Or  thiamine (B-1) injection 100 mg (has no administration in time range)  nicotine (NICODERM CQ - dosed in mg/24 hours) patch 14 mg (has no administration in time range)  phenytoin (DILANTIN) ER capsule 100 mg (has no administration in time range)     Initial Impression / Assessment and Plan / ED Course  I have reviewed the triage vital signs and the nursing notes.  Pertinent labs & imaging results that were available during my care of the patient were reviewed by me and considered in my medical decision making (see chart for details).        11:43 PM Patient in the emergency department for behavioral health evaluation. Labs are pending.  Will consult psych. Vitals are appropriate at this time 12:59 AM Patient is medically stable.  Awaiting psych consultation Final Clinical Impressions(s) / ED Diagnoses   Final diagnoses:  Alcohol abuse    ED Discharge Orders    None       Ripley Fraise, MD 04/17/19 0100

## 2019-04-17 DIAGNOSIS — F1014 Alcohol abuse with alcohol-induced mood disorder: Secondary | ICD-10-CM

## 2019-04-17 DIAGNOSIS — F1021 Alcohol dependence, in remission: Secondary | ICD-10-CM | POA: Insufficient documentation

## 2019-04-17 DIAGNOSIS — F1010 Alcohol abuse, uncomplicated: Secondary | ICD-10-CM | POA: Insufficient documentation

## 2019-04-17 HISTORY — DX: Alcohol abuse with alcohol-induced mood disorder: F10.14

## 2019-04-17 LAB — COMPREHENSIVE METABOLIC PANEL
ALT: 53 U/L — ABNORMAL HIGH (ref 0–44)
AST: 97 U/L — ABNORMAL HIGH (ref 15–41)
Albumin: 3.2 g/dL — ABNORMAL LOW (ref 3.5–5.0)
Alkaline Phosphatase: 93 U/L (ref 38–126)
Anion gap: 9 (ref 5–15)
BUN: 7 mg/dL — ABNORMAL LOW (ref 8–23)
CO2: 25 mmol/L (ref 22–32)
Calcium: 8.7 mg/dL — ABNORMAL LOW (ref 8.9–10.3)
Chloride: 104 mmol/L (ref 98–111)
Creatinine, Ser: 0.73 mg/dL (ref 0.61–1.24)
GFR calc Af Amer: >60 mL/min (ref >60)
GFR calc non Af Amer: >60 mL/min (ref >60)
Glucose, Bld: 101 mg/dL — ABNORMAL HIGH (ref 70–99)
Potassium: 3.7 mmol/L (ref 3.5–5.1)
Sodium: 138 mmol/L (ref 135–145)
Total Bilirubin: 1.7 mg/dL — ABNORMAL HIGH (ref 0.3–1.2)
Total Protein: 7.7 g/dL (ref 6.5–8.1)

## 2019-04-17 LAB — CBC WITH DIFFERENTIAL/PLATELET
Abs Immature Granulocytes: 0.02 10*3/uL (ref 0.00–0.07)
Basophils Absolute: 0 10*3/uL (ref 0.0–0.1)
Basophils Relative: 1 %
Eosinophils Absolute: 0.4 10*3/uL (ref 0.0–0.5)
Eosinophils Relative: 7 %
HCT: 44.3 % (ref 39.0–52.0)
Hemoglobin: 14.5 g/dL (ref 13.0–17.0)
Immature Granulocytes: 0 %
Lymphocytes Relative: 42 %
Lymphs Abs: 2.2 10*3/uL (ref 0.7–4.0)
MCH: 32.5 pg (ref 26.0–34.0)
MCHC: 32.7 g/dL (ref 30.0–36.0)
MCV: 99.3 fL (ref 80.0–100.0)
Monocytes Absolute: 0.6 10*3/uL (ref 0.1–1.0)
Monocytes Relative: 11 %
Neutro Abs: 2 10*3/uL (ref 1.7–7.7)
Neutrophils Relative %: 39 %
Platelets: 72 10*3/uL — ABNORMAL LOW (ref 150–400)
RBC: 4.46 MIL/uL (ref 4.22–5.81)
RDW: 13.7 % (ref 11.5–15.5)
WBC: 5.3 10*3/uL (ref 4.0–10.5)
nRBC: 0 % (ref 0.0–0.2)

## 2019-04-17 LAB — RAPID URINE DRUG SCREEN, HOSP PERFORMED
Amphetamines: NOT DETECTED
Barbiturates: NOT DETECTED
Benzodiazepines: NOT DETECTED
Cocaine: NOT DETECTED
Opiates: NOT DETECTED
Tetrahydrocannabinol: NOT DETECTED

## 2019-04-17 LAB — PHENYTOIN LEVEL, TOTAL: Phenytoin Lvl: <2.5 ug/mL — ABNORMAL LOW (ref 10.0–20.0)

## 2019-04-17 LAB — ETHANOL: Alcohol, Ethyl (B): 188 mg/dL — ABNORMAL HIGH (ref <10)

## 2019-04-17 MED ORDER — PHENYTOIN SODIUM EXTENDED 100 MG PO CAPS
100.0000 mg | ORAL_CAPSULE | Freq: Three times a day (TID) | ORAL | Status: DC
  Administered 2019-04-17: 100 mg via ORAL
  Filled 2019-04-17: qty 1

## 2019-04-17 NOTE — ED Notes (Signed)
Pt DCd off unit to home per MD order. Pt calm, cooperative, no s/s of distress. Pt DC information reviewed with pt, information given to pt, pt acknowledged understanding. Pt belongings given to pt . Pt ambulatory . Pt using bus for transportation per pt.

## 2019-04-17 NOTE — ED Notes (Addendum)
Pt transferred from [facility], presents with SI, plan to shoot self with Gun and AVH.  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  Pt reports he has flashbacks.  Denies HI and reports he has been off his meds for 1-2 weeks.  History of Schizophrenia and PTSD.  Pt also reports he had a seizure today.  Bilateral itching noted to both arms.  Dr Christy Gentles at bedside to eval pt.  Pt A&O x 3, calm & cooperative, sitter at bedside, monitoring for safety.  Pending labs, no seizure activity noted at present.

## 2019-04-17 NOTE — Progress Notes (Signed)
CSW called [facility] and AOD reports that pt is not registered in their system.  Pt can still be referred but must be registered before receiving services.   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  Pt has never received care from the [place] in [place] or [place] because "I don't like it."  Pt prefers to receive care at another hospital and does not want to be connected to [place]. Winferd Humphrey, MSW, LCSW Clinical Social Worker 04/17/2019 11:05 AM

## 2019-04-17 NOTE — ED Notes (Signed)
Pt resting comfortably in bed. Sitter at bedside. 

## 2019-04-17 NOTE — BH Assessment (Signed)
TTS Reassessment  The pt continues to be suicidal with a plan to cut himself.  The pt stated he has cut himself in the past as a suicide attempt.  The pt reported he served in the [employer] and hears voices of people he served with.  He stated he heard voices all night.  The pt was drowsy and pleasant during the assessment.
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
## 2019-04-17 NOTE — Consult Note (Addendum)
[facility] Psych ED Discharge  04/17/2019 11:32 AM Benjamin Peterson  MRN:  [id number] Principal Problem: Alcohol abuse with alcohol-induced mood disorder [facility]) Discharge Diagnoses: Principal Problem:   Alcohol abuse with alcohol-induced mood disorder ([place])   Subjective:Pt was seen and chart reviewed with treatment team and Dr Darleene Cleaver. Pt denies suicidal/homicidal ideation, denies auditory/visual hallucinations and does not appear to be responding to internal stimuli. Pt stated he has been off his medications for 1 month since moving to [place] from [place]. He has been drinking alcohol, his BAL was 188 on admit. His UDS was negative. He stated he was homeless but now has a house to live in with some friends. Pt denies that he drinks daily. He stated he is safe at his home and is able to contract for safety upon discharge. Pt will be provided with options for outpatient services in his discharge paperwork. He was encouraged to follow up outpatient for medication management. Pt is psychiatrically clear.   Total Time spent with patient: 30 minutes  Past Psychiatric History: As above  Past Medical History:  Past Medical History:  Diagnosis Date  . Cancer ([place])    liver  . Hepatitis C   . Schizophrenia ([place])   . Seizures ([place])    History reviewed. No pertinent surgical history. Family History: History reviewed. No pertinent family history. Family Psychiatric  History: Pt did not give this information Social History:  Social History   Substance and Sexual Activity  Alcohol Use Yes   Comment: 2.5 gallons of liquor     Social History   Substance and Sexual Activity  Drug Use No    Social History   Socioeconomic History  . Marital status: Married    Spouse name: Not on file  . Number of children: Not on file  . Years of education: Not on file  . Highest education level: Not on file  Occupational History  . Not on file  Social Needs  . Financial resource strain: Not on file  . Food  insecurity:    Worry: Not on file    Inability: Not on file  . Transportation needs:    Medical: Not on file    Non-medical: Not on file  Tobacco Use  . Smoking status: Heavy Tobacco Smoker    Packs/day: 1.00    Types: Cigarettes  . Smokeless tobacco: Never Used  Substance and Sexual Activity  . Alcohol use: Yes    Comment: 2.5 gallons of liquor  . Drug use: No  . Sexual activity: Not on file  Lifestyle  . Physical activity:    Days per week: Not on file    Minutes per session: Not on file  . Stress: Not on file  Relationships  . Social connections:    Talks on phone: Not on file    Gets together: Not on file    Attends religious service: Not on file    Active member of club or organization: Not on file    Attends meetings of clubs or organizations: Not on file    Relationship status: Not on file  Other Topics Concern  . Not on file  Social History Narrative  . Not on file    Has this patient used any form of tobacco in the last 30 days? (Cigarettes, Smokeless Tobacco, Cigars, and/or Pipes) Prescription not provided because: Pt declined  Current Medications: Current Facility-Administered Medications  Medication Dose Route Frequency Provider Last Rate Last Dose  . LORazepam (ATIVAN) injection 0-4 mg  0-4 mg Intravenous Q6H Ripley Fraise, MD       Or  . LORazepam (ATIVAN) tablet 0-4 mg  0-4 mg Oral Q6H Ripley Fraise, MD   1 mg at 04/17/19 0019  . [START ON 04/19/2019] LORazepam (ATIVAN) injection 0-4 mg  0-4 mg Intravenous Q12H Ripley Fraise, MD       Or  . Derrill Memo ON 04/19/2019] LORazepam (ATIVAN) tablet 0-4 mg  0-4 mg Oral Q12H Ripley Fraise, MD      . nicotine (NICODERM CQ - dosed in mg/24 hours) patch 14 mg  14 mg Transdermal Daily Ripley Fraise, MD      . phenytoin (DILANTIN) ER capsule 100 mg  100 mg Oral TID Ripley Fraise, MD   100 mg at 04/17/19 0949  . thiamine (VITAMIN B-1) tablet 100 mg  100 mg Oral Daily Ripley Fraise, MD   100 mg at 04/17/19  0086   Or  . thiamine (B-1) injection 100 mg  100 mg Intravenous Daily Ripley Fraise, MD       Current Outpatient Medications  Medication Sig Dispense Refill  . phenytoin (DILANTIN) 100 MG ER capsule Take 1 capsule (100 mg total) by mouth 3 (three) times daily. (Patient not taking: Reported on 03/23/2019) 90 capsule 0     Musculoskeletal: Strength & Muscle Tone: within normal limits Gait & Station: normal Patient leans: N/A  Psychiatric Specialty Exam: Physical Exam  Constitutional: He is oriented to person, place, and time. He appears well-developed and well-nourished.  HENT:  Head: Normocephalic.  Respiratory: Effort normal.  Musculoskeletal: Normal range of motion.  Neurological: He is alert and oriented to person, place, and time.  Psychiatric: His speech is normal and behavior is normal. Judgment and thought content normal. Cognition and memory are normal. He exhibits a depressed mood.    Review of Systems  Psychiatric/Behavioral: Positive for substance abuse (ETOH).  All other systems reviewed and are negative.   Blood pressure 126/61, pulse 75, temperature 98.5 F (36.9 C), temperature source Oral, resp. rate 20, SpO2 96 %.There is no height or weight on file to calculate BMI.  General Appearance: Casual  Eye Contact:  Good  Speech:  Clear and Coherent and Normal Rate  Volume:  Normal  Mood:  Depressed  Affect:  Congruent and Depressed  Thought Process:  Coherent, Linear and Descriptions of Associations: Intact  Orientation:  Full (Time, Place, and Person)  Thought Content:  Logical  Suicidal Thoughts:  No  Homicidal Thoughts:  No  Memory:  Immediate;   Good Recent;   Good Remote;   Fair  Judgement:  Fair  Insight:  Fair  Psychomotor Activity:  Normal  Concentration:  Concentration: Good and Attention Span: Good  Recall:  Good  Fund of Knowledge:  Good  Language:  Good  Akathisia:  Negative  Handed:  Right  AIMS (if indicated):     Assets:  Medical sales representative Housing Resilience Social Support  ADL's:  Intact  Cognition:  WNL  Sleep:        Demographic Factors:  Male, Caucasian, Low socioeconomic status and Unemployed  Loss Factors: Financial problems/change in socioeconomic status  Historical Factors: Family history of mental illness or substance abuse  Risk Reduction Factors:   Sense of responsibility to family  Continued Clinical Symptoms:  Alcohol/Substance Abuse/Dependencies  Cognitive Features That Contribute To Risk:  Closed-mindedness    Suicide Risk:  Minimal: No identifiable suicidal ideation.  Patients presenting with no risk factors but with morbid ruminations; may  be classified as minimal risk based on the severity of the depressive symptoms    Plan Of Care/Follow-up recommendations:  Activity:  as tolerated Diet:  Heart healthy  Disposition: Alcohol Abuse with Alcohol Induced Mood Disorder Take all medications as prescribed by your outpatient provider Keep all follow-up appointments as scheduled. Please follow up with [facility] in [place] for an appointment for medication management.  Do not consume alcohol or use illegal drugs while on prescription medications. Report any adverse effects from your medications to your primary care provider promptly.  In the event of recurrent symptoms or worsening symptoms, call 911, a crisis hotline, or go to the nearest emergency department for evaluation.   Ethelene Hal, NP 04/17/2019, 11:32 AM  Patient seen face-to-face for psychiatric evaluation, chart reviewed and case discussed with the physician extender and developed treatment plan. Reviewed the information documented and agree with the treatment plan. Corena Pilgrim, MD

## 2019-05-29 ENCOUNTER — Emergency Department (HOSPITAL_COMMUNITY)
Admission: EM | Admit: 2019-05-29 | Discharge: 2019-05-30 | Disposition: A | Discharge status: Home / Self Care | Payer: Medicaid Other | Attending: Emergency Medicine | Admitting: Emergency Medicine

## 2019-05-29 ENCOUNTER — Encounter (HOSPITAL_COMMUNITY): Payer: Self-pay

## 2019-05-29 ENCOUNTER — Emergency Department (HOSPITAL_COMMUNITY): Payer: Medicaid Other

## 2019-05-29 DIAGNOSIS — Y92521 Bus station as the place of occurrence of the external cause: Secondary | ICD-10-CM | POA: Insufficient documentation

## 2019-05-29 DIAGNOSIS — W010XXA Fall on same level from slipping, tripping and stumbling without subsequent striking against object, initial encounter: Secondary | ICD-10-CM | POA: Diagnosis not present

## 2019-05-29 DIAGNOSIS — F17210 Nicotine dependence, cigarettes, uncomplicated: Secondary | ICD-10-CM | POA: Diagnosis not present

## 2019-05-29 DIAGNOSIS — R4182 Altered mental status, unspecified: Secondary | ICD-10-CM | POA: Insufficient documentation

## 2019-05-29 DIAGNOSIS — Y999 Unspecified external cause status: Secondary | ICD-10-CM | POA: Insufficient documentation

## 2019-05-29 DIAGNOSIS — Z8505 Personal history of malignant neoplasm of liver: Secondary | ICD-10-CM | POA: Diagnosis not present

## 2019-05-29 DIAGNOSIS — F10920 Alcohol use, unspecified with intoxication, uncomplicated: Secondary | ICD-10-CM | POA: Diagnosis not present

## 2019-05-29 DIAGNOSIS — Y908 Blood alcohol level of 240 mg/100 ml or more: Secondary | ICD-10-CM | POA: Insufficient documentation

## 2019-05-29 DIAGNOSIS — Y939 Activity, unspecified: Secondary | ICD-10-CM | POA: Diagnosis not present

## 2019-05-29 LAB — URINALYSIS, ROUTINE W REFLEX MICROSCOPIC
Bilirubin Urine: NEGATIVE
Glucose, UA: NEGATIVE mg/dL
Hgb urine dipstick: NEGATIVE
Ketones, ur: NEGATIVE mg/dL
Leukocytes,Ua: NEGATIVE
Nitrite: NEGATIVE
Protein, ur: NEGATIVE mg/dL
Specific Gravity, Urine: 1.004 — ABNORMAL LOW (ref 1.005–1.030)
pH: 6 (ref 5.0–8.0)

## 2019-05-29 LAB — CBC WITH DIFFERENTIAL/PLATELET
Abs Immature Granulocytes: 0.01 10*3/uL (ref 0.00–0.07)
Basophils Absolute: 0 10*3/uL (ref 0.0–0.1)
Basophils Relative: 1 %
Eosinophils Absolute: 0.2 10*3/uL (ref 0.0–0.5)
Eosinophils Relative: 5 %
HCT: 41.6 % (ref 39.0–52.0)
Hemoglobin: 14.2 g/dL (ref 13.0–17.0)
Immature Granulocytes: 0 %
Lymphocytes Relative: 44 %
Lymphs Abs: 2 10*3/uL (ref 0.7–4.0)
MCH: 33.2 pg (ref 26.0–34.0)
MCHC: 34.1 g/dL (ref 30.0–36.0)
MCV: 97.2 fL (ref 80.0–100.0)
Monocytes Absolute: 0.5 10*3/uL (ref 0.1–1.0)
Monocytes Relative: 11 %
Neutro Abs: 1.7 10*3/uL (ref 1.7–7.7)
Neutrophils Relative %: 39 %
Platelets: 40 10*3/uL — ABNORMAL LOW (ref 150–400)
RBC: 4.28 MIL/uL (ref 4.22–5.81)
RDW: 14.1 % (ref 11.5–15.5)
WBC: 4.4 10*3/uL (ref 4.0–10.5)
nRBC: 0 % (ref 0.0–0.2)

## 2019-05-29 LAB — COMPREHENSIVE METABOLIC PANEL
ALT: 48 U/L — ABNORMAL HIGH (ref 0–44)
AST: 115 U/L — ABNORMAL HIGH (ref 15–41)
Albumin: 3.3 g/dL — ABNORMAL LOW (ref 3.5–5.0)
Alkaline Phosphatase: 95 U/L (ref 38–126)
Anion gap: 11 (ref 5–15)
BUN: 5 mg/dL — ABNORMAL LOW (ref 8–23)
CO2: 23 mmol/L (ref 22–32)
Calcium: 8.5 mg/dL — ABNORMAL LOW (ref 8.9–10.3)
Chloride: 106 mmol/L (ref 98–111)
Creatinine, Ser: 0.67 mg/dL (ref 0.61–1.24)
GFR calc Af Amer: >60 mL/min (ref >60)
GFR calc non Af Amer: >60 mL/min (ref >60)
Glucose, Bld: 92 mg/dL (ref 70–99)
Potassium: 3.4 mmol/L — ABNORMAL LOW (ref 3.5–5.1)
Sodium: 140 mmol/L (ref 135–145)
Total Bilirubin: 2.3 mg/dL — ABNORMAL HIGH (ref 0.3–1.2)
Total Protein: 7.8 g/dL (ref 6.5–8.1)

## 2019-05-29 LAB — RAPID URINE DRUG SCREEN, HOSP PERFORMED
Amphetamines: NOT DETECTED
Barbiturates: NOT DETECTED
Benzodiazepines: NOT DETECTED
Cocaine: NOT DETECTED
Opiates: NOT DETECTED
Tetrahydrocannabinol: NOT DETECTED

## 2019-05-29 LAB — ETHANOL: Alcohol, Ethyl (B): 329 mg/dL (ref <10)

## 2019-05-29 MED ORDER — THIAMINE HCL 100 MG/ML IJ SOLN
100.0000 mg | Freq: Every day | INTRAMUSCULAR | Status: DC
  Administered 2019-05-29: 20:00:00 100 mg via INTRAVENOUS
  Filled 2019-05-29: qty 2

## 2019-05-29 MED ORDER — LORAZEPAM 2 MG/ML IJ SOLN: 0.0000 mg | Freq: Two times a day (BID) | INTRAMUSCULAR | Status: DC

## 2019-05-29 MED ORDER — SODIUM CHLORIDE 0.9 % IV BOLUS
1000.0000 mL | Freq: Once | INTRAVENOUS | Status: AC
  Administered 2019-05-29: 1000 mL via INTRAVENOUS

## 2019-05-29 MED ORDER — VITAMIN B-1 100 MG PO TABS: 100.0000 mg | ORAL_TABLET | Freq: Every day | ORAL | Status: DC

## 2019-05-29 MED ORDER — LORAZEPAM 1 MG PO TABS: 0.0000 mg | ORAL_TABLET | Freq: Two times a day (BID) | ORAL | Status: DC

## 2019-05-29 MED ORDER — LORAZEPAM 1 MG PO TABS: 0.0000 mg | ORAL_TABLET | Freq: Four times a day (QID) | ORAL | Status: DC

## 2019-05-29 MED ORDER — LORAZEPAM 2 MG/ML IJ SOLN: 0.0000 mg | Freq: Four times a day (QID) | INTRAMUSCULAR | Status: DC

## 2019-05-29 NOTE — ED Notes (Signed)
Unable to perform CIWA - pt resting.
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
## 2019-05-30 NOTE — ED Notes (Signed)
Discharge instructions discussed with pt. Pt verbalized understanding. Pt stable and ambulatory. No signature pad available. 

## 2019-05-30 NOTE — ED Notes (Signed)
Pt ambulated in hall then to bathroom and back with no assistance from staff.

## 2019-05-30 NOTE — ED Provider Notes (Signed)
I assumed care of this patient from Dr. Vanita Panda at 2300.  Please see their note for further details of Hx, PE.  Briefly patient is a 63 y.o. male who presented with alcohol intoxication awaiting MTF.   Allowed to MTF  Ambulated without complication.  The patient is safe for discharge with strict return precautions.   The patient appears reasonably screened and/or stabilized for discharge and I doubt any other medical condition or other [facility] requiring further screening, evaluation, or treatment in the ED at this time prior to discharge.  Disposition: Discharge  Condition: Good  I have discussed the results, Dx and Tx plan with the patient who expressed understanding and agree(s) with the plan. Discharge instructions discussed at great length. The patient was given strict return precautions who verbalized understanding of the instructions. No further questions at time of discharge.    ED Discharge Orders    None      Follow Up: Primary care provider  Schedule an appointment as soon as possible for a visit        Timon Geissinger, Grayce Sessions, MD 05/30/19 [phone]

## 2019-06-17 ENCOUNTER — Emergency Department (HOSPITAL_COMMUNITY): Payer: Medicaid Other

## 2019-06-17 ENCOUNTER — Other Ambulatory Visit: Payer: Self-pay

## 2019-06-17 ENCOUNTER — Emergency Department (HOSPITAL_COMMUNITY)
Admission: EM | Admit: 2019-06-17 | Discharge: 2019-06-17 | Disposition: A | Discharge status: Home / Self Care | Payer: Medicaid Other | Attending: Emergency Medicine | Admitting: Emergency Medicine

## 2019-06-17 DIAGNOSIS — F10120 Alcohol abuse with intoxication, uncomplicated: Secondary | ICD-10-CM | POA: Diagnosis not present

## 2019-06-17 DIAGNOSIS — Z9114 Patient's other noncompliance with medication regimen: Secondary | ICD-10-CM | POA: Insufficient documentation

## 2019-06-17 DIAGNOSIS — F17210 Nicotine dependence, cigarettes, uncomplicated: Secondary | ICD-10-CM | POA: Diagnosis not present

## 2019-06-17 DIAGNOSIS — F10920 Alcohol use, unspecified with intoxication, uncomplicated: Secondary | ICD-10-CM

## 2019-06-17 DIAGNOSIS — M542 Cervicalgia: Secondary | ICD-10-CM | POA: Diagnosis not present

## 2019-06-17 DIAGNOSIS — Z79899 Other long term (current) drug therapy: Secondary | ICD-10-CM | POA: Diagnosis not present

## 2019-06-17 DIAGNOSIS — F209 Schizophrenia, unspecified: Secondary | ICD-10-CM | POA: Diagnosis not present

## 2019-06-17 DIAGNOSIS — Z8505 Personal history of malignant neoplasm of liver: Secondary | ICD-10-CM | POA: Insufficient documentation

## 2019-06-17 DIAGNOSIS — M79601 Pain in right arm: Secondary | ICD-10-CM | POA: Diagnosis present

## 2019-06-17 LAB — BASIC METABOLIC PANEL
Anion gap: 11 (ref 5–15)
BUN: 6 mg/dL — ABNORMAL LOW (ref 8–23)
CO2: 22 mmol/L (ref 22–32)
Calcium: 8.4 mg/dL — ABNORMAL LOW (ref 8.9–10.3)
Chloride: 105 mmol/L (ref 98–111)
Creatinine, Ser: 0.59 mg/dL — ABNORMAL LOW (ref 0.61–1.24)
GFR calc Af Amer: >60 mL/min (ref >60)
GFR calc non Af Amer: >60 mL/min (ref >60)
Glucose, Bld: 75 mg/dL (ref 70–99)
Potassium: 3.7 mmol/L (ref 3.5–5.1)
Sodium: 138 mmol/L (ref 135–145)

## 2019-06-17 LAB — CBC WITH DIFFERENTIAL/PLATELET
Abs Immature Granulocytes: 0.02 10*3/uL (ref 0.00–0.07)
Basophils Absolute: 0 10*3/uL (ref 0.0–0.1)
Basophils Relative: 1 %
Eosinophils Absolute: 0.1 10*3/uL (ref 0.0–0.5)
Eosinophils Relative: 2 %
HCT: 39 % (ref 39.0–52.0)
Hemoglobin: 13.3 g/dL (ref 13.0–17.0)
Immature Granulocytes: 1 %
Lymphocytes Relative: 35 %
Lymphs Abs: 1.5 10*3/uL (ref 0.7–4.0)
MCH: 33.4 pg (ref 26.0–34.0)
MCHC: 34.1 g/dL (ref 30.0–36.0)
MCV: 98 fL (ref 80.0–100.0)
Monocytes Absolute: 0.4 10*3/uL (ref 0.1–1.0)
Monocytes Relative: 10 %
Neutro Abs: 2.2 10*3/uL (ref 1.7–7.7)
Neutrophils Relative %: 51 %
Platelets: 49 10*3/uL — ABNORMAL LOW (ref 150–400)
RBC: 3.98 MIL/uL — ABNORMAL LOW (ref 4.22–5.81)
RDW: 14.4 % (ref 11.5–15.5)
WBC: 4.2 10*3/uL (ref 4.0–10.5)
nRBC: 0 % (ref 0.0–0.2)

## 2019-06-17 LAB — ETHANOL: Alcohol, Ethyl (B): 220 mg/dL — ABNORMAL HIGH (ref <10)

## 2019-06-17 MED ORDER — SODIUM CHLORIDE 0.9 % IV BOLUS
1000.0000 mL | Freq: Once | INTRAVENOUS | Status: AC
  Administered 2019-06-17: 1000 mL via INTRAVENOUS
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
## 2019-06-17 NOTE — ED Notes (Signed)
Patient transported to X-ray 

## 2019-06-17 NOTE — ED Notes (Signed)
Pt independently ambulatory with mildly steady gait in hallway.
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
## 2019-06-26 ENCOUNTER — Emergency Department (HOSPITAL_COMMUNITY): Payer: Medicaid Other

## 2019-06-26 ENCOUNTER — Emergency Department (HOSPITAL_COMMUNITY)
Admission: EM | Admit: 2019-06-26 | Discharge: 2019-06-27 | Disposition: A | Discharge status: Home / Self Care | Payer: Medicaid Other | Attending: Emergency Medicine | Admitting: Emergency Medicine

## 2019-06-26 ENCOUNTER — Encounter (HOSPITAL_COMMUNITY): Payer: Self-pay

## 2019-06-26 ENCOUNTER — Other Ambulatory Visit: Payer: Self-pay

## 2019-06-26 DIAGNOSIS — F17210 Nicotine dependence, cigarettes, uncomplicated: Secondary | ICD-10-CM | POA: Insufficient documentation

## 2019-06-26 DIAGNOSIS — F10920 Alcohol use, unspecified with intoxication, uncomplicated: Secondary | ICD-10-CM | POA: Diagnosis not present

## 2019-06-26 DIAGNOSIS — Y929 Unspecified place or not applicable: Secondary | ICD-10-CM | POA: Diagnosis not present

## 2019-06-26 DIAGNOSIS — Z8505 Personal history of malignant neoplasm of liver: Secondary | ICD-10-CM | POA: Insufficient documentation

## 2019-06-26 DIAGNOSIS — R51 Headache: Secondary | ICD-10-CM | POA: Diagnosis not present

## 2019-06-26 DIAGNOSIS — S0990XA Unspecified injury of head, initial encounter: Secondary | ICD-10-CM | POA: Diagnosis present

## 2019-06-26 DIAGNOSIS — S0001XA Abrasion of scalp, initial encounter: Secondary | ICD-10-CM | POA: Diagnosis not present

## 2019-06-26 DIAGNOSIS — Y998 Other external cause status: Secondary | ICD-10-CM | POA: Diagnosis not present

## 2019-06-26 DIAGNOSIS — S0003XA Contusion of scalp, initial encounter: Secondary | ICD-10-CM | POA: Insufficient documentation

## 2019-06-26 DIAGNOSIS — R4182 Altered mental status, unspecified: Secondary | ICD-10-CM | POA: Insufficient documentation

## 2019-06-26 DIAGNOSIS — Y9389 Activity, other specified: Secondary | ICD-10-CM | POA: Diagnosis not present

## 2019-06-26 LAB — CBC
HCT: 41.4 % (ref 39.0–52.0)
Hemoglobin: 13.9 g/dL (ref 13.0–17.0)
MCH: 33.1 pg (ref 26.0–34.0)
MCHC: 33.6 g/dL (ref 30.0–36.0)
MCV: 98.6 fL (ref 80.0–100.0)
Platelets: 44 10*3/uL — ABNORMAL LOW (ref 150–400)
RBC: 4.2 MIL/uL — ABNORMAL LOW (ref 4.22–5.81)
RDW: 14.9 % (ref 11.5–15.5)
WBC: 4.3 10*3/uL (ref 4.0–10.5)
nRBC: 0 % (ref 0.0–0.2)

## 2019-06-26 LAB — COMPREHENSIVE METABOLIC PANEL
ALT: 38 U/L (ref 0–44)
AST: 93 U/L — ABNORMAL HIGH (ref 15–41)
Albumin: 3.1 g/dL — ABNORMAL LOW (ref 3.5–5.0)
Alkaline Phosphatase: 101 U/L (ref 38–126)
Anion gap: 12 (ref 5–15)
BUN: 5 mg/dL — ABNORMAL LOW (ref 8–23)
CO2: 23 mmol/L (ref 22–32)
Calcium: 8.2 mg/dL — ABNORMAL LOW (ref 8.9–10.3)
Chloride: 106 mmol/L (ref 98–111)
Creatinine, Ser: 0.72 mg/dL (ref 0.61–1.24)
GFR calc Af Amer: >60 mL/min (ref >60)
GFR calc non Af Amer: >60 mL/min (ref >60)
Glucose, Bld: 92 mg/dL (ref 70–99)
Potassium: 3.7 mmol/L (ref 3.5–5.1)
Sodium: 141 mmol/L (ref 135–145)
Total Bilirubin: 1.9 mg/dL — ABNORMAL HIGH (ref 0.3–1.2)
Total Protein: 7.4 g/dL (ref 6.5–8.1)

## 2019-06-26 MED ORDER — SODIUM CHLORIDE 0.9 % IV BOLUS
1000.0000 mL | Freq: Once | INTRAVENOUS | Status: AC
  Administered 2019-06-26: 23:00:00 1000 mL via INTRAVENOUS
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
## 2019-06-27 NOTE — ED Notes (Signed)
Sleeping  For awhile now

## 2019-06-27 NOTE — ED Notes (Signed)
Walked pt around nurses station. Pt got up to walk and refused the walker stating he can walk fine. After halfway through the nurses station he started putting his arms out saying he was completely blind and couldn't see. Walked around the nurses station with no trouble.

## 2019-07-14 ENCOUNTER — Emergency Department (HOSPITAL_COMMUNITY): Payer: Medicaid Other

## 2019-07-14 ENCOUNTER — Encounter (HOSPITAL_COMMUNITY): Payer: Self-pay

## 2019-07-14 ENCOUNTER — Emergency Department (HOSPITAL_COMMUNITY)
Admission: EM | Admit: 2019-07-14 | Discharge: 2019-07-14 | Disposition: A | Discharge status: Home / Self Care | Payer: Medicaid Other | Attending: Emergency Medicine | Admitting: Emergency Medicine

## 2019-07-14 ENCOUNTER — Other Ambulatory Visit: Payer: Self-pay

## 2019-07-14 DIAGNOSIS — S50312A Abrasion of left elbow, initial encounter: Secondary | ICD-10-CM | POA: Insufficient documentation

## 2019-07-14 DIAGNOSIS — M25521 Pain in right elbow: Secondary | ICD-10-CM | POA: Diagnosis not present

## 2019-07-14 DIAGNOSIS — Y92240 Courthouse as the place of occurrence of the external cause: Secondary | ICD-10-CM | POA: Diagnosis not present

## 2019-07-14 DIAGNOSIS — Z79899 Other long term (current) drug therapy: Secondary | ICD-10-CM | POA: Insufficient documentation

## 2019-07-14 DIAGNOSIS — F17210 Nicotine dependence, cigarettes, uncomplicated: Secondary | ICD-10-CM | POA: Insufficient documentation

## 2019-07-14 DIAGNOSIS — Y999 Unspecified external cause status: Secondary | ICD-10-CM | POA: Diagnosis not present

## 2019-07-14 DIAGNOSIS — H548 Legal blindness, as defined in USA: Secondary | ICD-10-CM | POA: Diagnosis not present

## 2019-07-14 DIAGNOSIS — R296 Repeated falls: Secondary | ICD-10-CM | POA: Diagnosis not present

## 2019-07-14 DIAGNOSIS — H5789 Other specified disorders of eye and adnexa: Secondary | ICD-10-CM | POA: Diagnosis not present

## 2019-07-14 DIAGNOSIS — Z8505 Personal history of malignant neoplasm of liver: Secondary | ICD-10-CM | POA: Insufficient documentation

## 2019-07-14 DIAGNOSIS — Y9389 Activity, other specified: Secondary | ICD-10-CM | POA: Insufficient documentation

## 2019-07-14 DIAGNOSIS — Z23 Encounter for immunization: Secondary | ICD-10-CM | POA: Insufficient documentation

## 2019-07-14 DIAGNOSIS — T148XXA Other injury of unspecified body region, initial encounter: Secondary | ICD-10-CM

## 2019-07-14 DIAGNOSIS — W108XXA Fall (on) (from) other stairs and steps, initial encounter: Secondary | ICD-10-CM | POA: Insufficient documentation

## 2019-07-14 DIAGNOSIS — S299XXA Unspecified injury of thorax, initial encounter: Secondary | ICD-10-CM | POA: Diagnosis present

## 2019-07-14 DIAGNOSIS — S20219A Contusion of unspecified front wall of thorax, initial encounter: Secondary | ICD-10-CM | POA: Diagnosis not present

## 2019-07-14 DIAGNOSIS — S80812A Abrasion, left lower leg, initial encounter: Secondary | ICD-10-CM | POA: Diagnosis not present

## 2019-07-14 LAB — CBC WITH DIFFERENTIAL/PLATELET
Abs Immature Granulocytes: 0.01 10*3/uL (ref 0.00–0.07)
Basophils Absolute: 0 10*3/uL (ref 0.0–0.1)
Basophils Relative: 1 %
Eosinophils Absolute: 0.1 10*3/uL (ref 0.0–0.5)
Eosinophils Relative: 2 %
HCT: 41 % (ref 39.0–52.0)
Hemoglobin: 13.6 g/dL (ref 13.0–17.0)
Immature Granulocytes: 0 %
Lymphocytes Relative: 27 %
Lymphs Abs: 0.8 10*3/uL (ref 0.7–4.0)
MCH: 33.1 pg (ref 26.0–34.0)
MCHC: 33.2 g/dL (ref 30.0–36.0)
MCV: 99.8 fL (ref 80.0–100.0)
Monocytes Absolute: 0.4 10*3/uL (ref 0.1–1.0)
Monocytes Relative: 12 %
Neutro Abs: 1.8 10*3/uL (ref 1.7–7.7)
Neutrophils Relative %: 58 %
Platelets: 32 10*3/uL — ABNORMAL LOW (ref 150–400)
RBC: 4.11 MIL/uL — ABNORMAL LOW (ref 4.22–5.81)
RDW: 14.6 % (ref 11.5–15.5)
WBC: 3 10*3/uL — ABNORMAL LOW (ref 4.0–10.5)
nRBC: 0 % (ref 0.0–0.2)

## 2019-07-14 LAB — BASIC METABOLIC PANEL
Anion gap: 11 (ref 5–15)
BUN: 6 mg/dL — ABNORMAL LOW (ref 8–23)
CO2: 26 mmol/L (ref 22–32)
Calcium: 9 mg/dL (ref 8.9–10.3)
Chloride: 102 mmol/L (ref 98–111)
Creatinine, Ser: 0.7 mg/dL (ref 0.61–1.24)
GFR calc Af Amer: >60 mL/min (ref >60)
GFR calc non Af Amer: >60 mL/min (ref >60)
Glucose, Bld: 86 mg/dL (ref 70–99)
Potassium: 3.6 mmol/L (ref 3.5–5.1)
Sodium: 139 mmol/L (ref 135–145)

## 2019-07-14 MED ORDER — TETRACAINE HCL 0.5 % OP SOLN
2.0000 [drp] | Freq: Once | OPHTHALMIC | Status: AC
  Administered 2019-07-14: 13:00:00 2 [drp] via OPHTHALMIC
  Filled 2019-07-14: qty 4

## 2019-07-14 MED ORDER — FENTANYL CITRATE (PF) 100 MCG/2ML IJ SOLN
50.0000 ug | Freq: Once | INTRAMUSCULAR | Status: AC
  Administered 2019-07-14: 50 ug via INTRAVENOUS
  Filled 2019-07-14: qty 2

## 2019-07-14 MED ORDER — TETANUS-DIPHTH-ACELL PERTUSSIS 5-2.5-18.5 LF-MCG/0.5 IM SUSP
0.5000 mL | Freq: Once | INTRAMUSCULAR | Status: AC
  Administered 2019-07-14: 0.5 mL via INTRAMUSCULAR
  Filled 2019-07-14: qty 0.5

## 2019-07-14 MED ORDER — IOHEXOL 300 MG/ML  SOLN
75.0000 mL | Freq: Once | INTRAMUSCULAR | Status: AC | PRN
  Administered 2019-07-14: 13:00:00 75 mL via INTRAVENOUS

## 2019-07-14 MED ORDER — FLUORESCEIN SODIUM 1 MG OP STRP
1.0000 | ORAL_STRIP | Freq: Once | OPHTHALMIC | Status: AC
  Administered 2019-07-14: 1 via OPHTHALMIC
  Filled 2019-07-14: qty 1

## 2019-07-14 NOTE — ED Notes (Signed)
Yellow socks put on pt.

## 2019-07-14 NOTE — ED Notes (Signed)
Patient transported to CT 

## 2019-07-14 NOTE — ED Notes (Signed)
Pt complaining of burning to eyes.

## 2019-07-14 NOTE — ED Notes (Signed)
Pt states that he drink 2-3 40 oz beers a day. States that he is sober right now because he had to go to court this morning. Pt states that his last drink was last night and he tried not to drink it but he started having withdrawal symptoms.
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
## 2019-07-14 NOTE — Progress Notes (Signed)
CSW spoke with Dawn at [employer] [facility] [employer] [facility] [employer] who states this patient is not a client of their agency. Dawn provided CSW with Quita Skye, mobility specialist contact information to assist with obtaining a new walking device for this patient. CSW left voicemail with Raquel Sarna requesting a return call.  Madilyn Fireman, MSW, LCSW-A Clinical Social Worker [facility] [phone]

## 2019-07-14 NOTE — ED Notes (Signed)
Pt transported to xray 

## 2019-07-14 NOTE — ED Notes (Signed)
Roselyn Reef PA aware of pt complaining of eye pain.

## 2019-07-14 NOTE — ED Notes (Signed)
Audrie Lia. PA at bedside.

## 2019-07-14 NOTE — Discharge Instructions (Signed)
It was my pleasure taking care of you today!   I have spoken with the social worker who should be getting in touch with you by phone about resources for the blind in the area.   Follow up with your primary care doctor. If you do not have one, call the clinic listed.   Return to ER for new or worsening symptoms, any additional concerns.

## 2019-07-14 NOTE — ED Notes (Signed)
Jamie PA at bedside.   
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
## 2019-07-14 NOTE — ED Notes (Signed)
Social work to come and update about pt.

## 2019-07-14 NOTE — ED Notes (Signed)
Patient transported to X-ray 

## 2019-07-14 NOTE — ED Notes (Signed)
This RN spoke with case management about trying to get pt some resources.

## 2019-07-14 NOTE — Discharge Planning (Signed)
TOC consulted regarding resources for pt who is blind.  Will update team as information comes available.

## 2019-07-21 ENCOUNTER — Other Ambulatory Visit: Payer: Self-pay

## 2019-07-21 ENCOUNTER — Emergency Department (HOSPITAL_COMMUNITY)
Admission: EM | Admit: 2019-07-21 | Discharge: 2019-07-21 | Disposition: A | Discharge status: Home / Self Care | Payer: Medicaid Other | Attending: Emergency Medicine | Admitting: Emergency Medicine

## 2019-07-21 ENCOUNTER — Encounter (HOSPITAL_COMMUNITY): Payer: Self-pay | Admitting: Emergency Medicine

## 2019-07-21 DIAGNOSIS — F10920 Alcohol use, unspecified with intoxication, uncomplicated: Secondary | ICD-10-CM

## 2019-07-21 DIAGNOSIS — Z79899 Other long term (current) drug therapy: Secondary | ICD-10-CM | POA: Insufficient documentation

## 2019-07-21 DIAGNOSIS — F17210 Nicotine dependence, cigarettes, uncomplicated: Secondary | ICD-10-CM | POA: Diagnosis not present

## 2019-07-21 DIAGNOSIS — F10229 Alcohol dependence with intoxication, unspecified: Secondary | ICD-10-CM | POA: Diagnosis present

## 2019-07-21 DIAGNOSIS — Y908 Blood alcohol level of 240 mg/100 ml or more: Secondary | ICD-10-CM | POA: Diagnosis not present

## 2019-07-21 DIAGNOSIS — F1020 Alcohol dependence, uncomplicated: Secondary | ICD-10-CM

## 2019-07-21 LAB — CBC
HCT: 38.9 % — ABNORMAL LOW (ref 39.0–52.0)
Hemoglobin: 13 g/dL (ref 13.0–17.0)
MCH: 33.3 pg (ref 26.0–34.0)
MCHC: 33.4 g/dL (ref 30.0–36.0)
MCV: 99.7 fL (ref 80.0–100.0)
Platelets: 61 10*3/uL — ABNORMAL LOW (ref 150–400)
RBC: 3.9 MIL/uL — ABNORMAL LOW (ref 4.22–5.81)
RDW: 15.2 % (ref 11.5–15.5)
WBC: 4 10*3/uL (ref 4.0–10.5)
nRBC: 0 % (ref 0.0–0.2)

## 2019-07-21 LAB — COMPREHENSIVE METABOLIC PANEL
ALT: 62 U/L — ABNORMAL HIGH (ref 0–44)
AST: 173 U/L — ABNORMAL HIGH (ref 15–41)
Albumin: 3 g/dL — ABNORMAL LOW (ref 3.5–5.0)
Alkaline Phosphatase: 112 U/L (ref 38–126)
Anion gap: 12 (ref 5–15)
BUN: 5 mg/dL — ABNORMAL LOW (ref 8–23)
CO2: 23 mmol/L (ref 22–32)
Calcium: 8.3 mg/dL — ABNORMAL LOW (ref 8.9–10.3)
Chloride: 103 mmol/L (ref 98–111)
Creatinine, Ser: 0.84 mg/dL (ref 0.61–1.24)
GFR calc Af Amer: >60 mL/min (ref >60)
GFR calc non Af Amer: >60 mL/min (ref >60)
Glucose, Bld: 123 mg/dL — ABNORMAL HIGH (ref 70–99)
Potassium: 3.6 mmol/L (ref 3.5–5.1)
Sodium: 138 mmol/L (ref 135–145)
Total Bilirubin: 3.6 mg/dL — ABNORMAL HIGH (ref 0.3–1.2)
Total Protein: 7.8 g/dL (ref 6.5–8.1)

## 2019-07-21 LAB — ETHANOL: Alcohol, Ethyl (B): 333 mg/dL (ref <10)

## 2019-07-21 NOTE — ED Notes (Signed)
Patient had sandwich and ginger ale, tolerated po intake well. Patient was able to ambulate in hall as well with this nurse.

## 2019-07-21 NOTE — Discharge Instructions (Addendum)
It was our pleasure to provide your ER care today - we hope that you feel better.  Avoid alcohol use. Follow up with AA and use resource guide provided for additional community resources.   Follow up with primary care doctor in the coming week.  Return to ER if worse, new symptoms, fevers, trouble breathing, new or severe pain, or other concern.

## 2019-07-21 NOTE — ED Notes (Signed)
ptar called for pt 
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
## 2019-07-21 NOTE — ED Notes (Signed)
Patient verbalizes understanding of discharge instructions. Opportunity for questioning and answers were provided. Armband removed by staff, pt discharged from ED via [place].

## 2019-07-21 NOTE — Care Management (Signed)
ED CM met with patient at bedside, patient confirms living here in an apartment in [place], states his spouse stays there occasionally.  Patient requesting that I help him find somewhere else to live, he said he lives in "[place]". Patient is apparently blind he is unable to give specific address.  When told that he was picked at [place], patient states he does not remember how he got there.  CM attempted to call his emergency contact no answer spouse Margaretmary Lombard [phone].  CM has contacted [facility] for safety check awaiting a return call.   APS report pending.
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
## 2019-07-25 ENCOUNTER — Emergency Department (HOSPITAL_COMMUNITY): Payer: Medicaid Other

## 2019-07-25 ENCOUNTER — Other Ambulatory Visit: Payer: Self-pay

## 2019-07-25 ENCOUNTER — Emergency Department (HOSPITAL_COMMUNITY)
Admission: EM | Admit: 2019-07-25 | Discharge: 2019-07-25 | Disposition: A | Discharge status: Home / Self Care | Payer: Medicaid Other | Attending: Emergency Medicine | Admitting: Emergency Medicine

## 2019-07-25 DIAGNOSIS — Z79899 Other long term (current) drug therapy: Secondary | ICD-10-CM | POA: Insufficient documentation

## 2019-07-25 DIAGNOSIS — F17210 Nicotine dependence, cigarettes, uncomplicated: Secondary | ICD-10-CM | POA: Diagnosis not present

## 2019-07-25 DIAGNOSIS — Y9301 Activity, walking, marching and hiking: Secondary | ICD-10-CM | POA: Insufficient documentation

## 2019-07-25 DIAGNOSIS — S2232XA Fracture of one rib, left side, initial encounter for closed fracture: Secondary | ICD-10-CM | POA: Diagnosis not present

## 2019-07-25 DIAGNOSIS — Y999 Unspecified external cause status: Secondary | ICD-10-CM | POA: Insufficient documentation

## 2019-07-25 DIAGNOSIS — Y901 Blood alcohol level of 20-39 mg/100 ml: Secondary | ICD-10-CM | POA: Insufficient documentation

## 2019-07-25 DIAGNOSIS — R569 Unspecified convulsions: Secondary | ICD-10-CM

## 2019-07-25 DIAGNOSIS — W19XXXA Unspecified fall, initial encounter: Secondary | ICD-10-CM

## 2019-07-25 DIAGNOSIS — W1839XA Other fall on same level, initial encounter: Secondary | ICD-10-CM | POA: Insufficient documentation

## 2019-07-25 DIAGNOSIS — F1010 Alcohol abuse, uncomplicated: Secondary | ICD-10-CM | POA: Diagnosis not present

## 2019-07-25 DIAGNOSIS — Y92481 Parking lot as the place of occurrence of the external cause: Secondary | ICD-10-CM | POA: Insufficient documentation

## 2019-07-25 DIAGNOSIS — S2232XS Fracture of one rib, left side, sequela: Secondary | ICD-10-CM

## 2019-07-25 DIAGNOSIS — Z590 Homelessness: Secondary | ICD-10-CM | POA: Diagnosis not present

## 2019-07-25 LAB — HEPATIC FUNCTION PANEL
ALT: 51 U/L — ABNORMAL HIGH (ref 0–44)
AST: 117 U/L — ABNORMAL HIGH (ref 15–41)
Albumin: 3 g/dL — ABNORMAL LOW (ref 3.5–5.0)
Alkaline Phosphatase: 128 U/L — ABNORMAL HIGH (ref 38–126)
Bilirubin, Direct: 1.5 mg/dL — ABNORMAL HIGH (ref 0.0–0.2)
Indirect Bilirubin: 2.3 mg/dL — ABNORMAL HIGH (ref 0.3–0.9)
Total Bilirubin: 3.8 mg/dL — ABNORMAL HIGH (ref 0.3–1.2)
Total Protein: 8.5 g/dL — ABNORMAL HIGH (ref 6.5–8.1)

## 2019-07-25 LAB — ETHANOL: Alcohol, Ethyl (B): 32 mg/dL — ABNORMAL HIGH (ref <10)

## 2019-07-25 LAB — BASIC METABOLIC PANEL
Anion gap: 10 (ref 5–15)
BUN: 6 mg/dL — ABNORMAL LOW (ref 8–23)
CO2: 27 mmol/L (ref 22–32)
Calcium: 8.6 mg/dL — ABNORMAL LOW (ref 8.9–10.3)
Chloride: 103 mmol/L (ref 98–111)
Creatinine, Ser: 0.6 mg/dL — ABNORMAL LOW (ref 0.61–1.24)
GFR calc Af Amer: >60 mL/min (ref >60)
GFR calc non Af Amer: >60 mL/min (ref >60)
Glucose, Bld: 75 mg/dL (ref 70–99)
Potassium: 4 mmol/L (ref 3.5–5.1)
Sodium: 140 mmol/L (ref 135–145)

## 2019-07-25 LAB — CBC
HCT: 40.4 % (ref 39.0–52.0)
Hemoglobin: 13.4 g/dL (ref 13.0–17.0)
MCH: 33.7 pg (ref 26.0–34.0)
MCHC: 33.2 g/dL (ref 30.0–36.0)
MCV: 101.5 fL — ABNORMAL HIGH (ref 80.0–100.0)
Platelets: 56 10*3/uL — ABNORMAL LOW (ref 150–400)
RBC: 3.98 MIL/uL — ABNORMAL LOW (ref 4.22–5.81)
RDW: 15 % (ref 11.5–15.5)
WBC: 3 10*3/uL — ABNORMAL LOW (ref 4.0–10.5)
nRBC: 0 % (ref 0.0–0.2)

## 2019-07-25 LAB — CBG MONITORING, ED: Glucose-Capillary: 76 mg/dL (ref 70–99)

## 2019-07-25 MED ORDER — SODIUM CHLORIDE 0.9 % IV BOLUS
1000.0000 mL | Freq: Once | INTRAVENOUS | Status: AC
  Administered 2019-07-25: 16:00:00 1000 mL via INTRAVENOUS

## 2019-07-25 MED ORDER — PHENYTOIN SODIUM EXTENDED 100 MG PO CAPS: 100.0000 mg | ORAL_CAPSULE | Freq: Three times a day (TID) | ORAL | 0 refills | Status: DC

## 2019-07-25 MED ORDER — THIAMINE HCL 100 MG/ML IJ SOLN
100.0000 mg | Freq: Once | INTRAMUSCULAR | Status: AC
  Administered 2019-07-25: 15:00:00 100 mg via INTRAVENOUS
  Filled 2019-07-25: qty 2

## 2019-07-25 MED ORDER — FOLIC ACID 1 MG PO TABS
1.0000 mg | ORAL_TABLET | Freq: Once | ORAL | Status: AC
  Administered 2019-07-25: 16:00:00 1 mg via ORAL
  Filled 2019-07-25: qty 1

## 2019-07-25 MED ORDER — LORAZEPAM 2 MG/ML IJ SOLN
0.5000 mg | Freq: Once | INTRAMUSCULAR | Status: DC
  Filled 2019-07-25: qty 1

## 2019-07-25 MED ORDER — PHENYTOIN SODIUM EXTENDED 100 MG PO CAPS
100.0000 mg | ORAL_CAPSULE | Freq: Once | ORAL | Status: AC
  Administered 2019-07-25: 100 mg via ORAL
  Filled 2019-07-25: qty 1

## 2019-07-25 MED ORDER — PHENYTOIN SODIUM EXTENDED 100 MG PO CAPS
400.0000 mg | ORAL_CAPSULE | Freq: Once | ORAL | Status: AC
  Administered 2019-07-25: 400 mg via ORAL
  Filled 2019-07-25: qty 4

## 2019-07-25 NOTE — ED Notes (Signed)
ED Provider at bedside. 

## 2019-07-25 NOTE — ED Notes (Signed)
Provider notified of orthostatic changes

## 2019-07-25 NOTE — ED Provider Notes (Signed)
Care assumed from  [facility], [place] at shift change with re-evaluation pending.   In brief, this patient is a 63 y.o. M with PMH/o homelessness, seizures (on dilantin), hepatitis, alcohol abuse who presents for evaluation of fall and seizure. Unknown duration of seizure. He reports he stopped drinking alcohol 3-4 days ago and has not taken his dilantin. Please see note from previous provider for full history/physical exam.    Physical Exam  BP 133/69   Pulse 76   Temp 99.5 F (37.5 C)   Resp 15   SpO2 97%   Physical Exam  ED Course/Procedures     Procedures  MDM    PLAN: Patient getting fluids and then discharge.   MDM:  Reevaluation.  Patient resting comfortably in bed.  He is alert and oriented x4 and answers questions appropriately.  He is not slurring his speech.  He appears clinically sober.  Stable for discharge at this time. At this time, patient exhibits no emergent life-threatening condition that require further evaluation in ED or admission. Patient had ample opportunity for questions and discussion. All patient's questions were answered with full understanding. Strict return precautions discussed. Patient expresses understanding and agreement to plan.   1. Fall, initial encounter   2. Seizure-like activity ([place])   3. Alcohol abuse   4. Closed fracture of one rib of left side, sequela      Volanda Napoleon, PA-C 07/25/19 2244    Hayden Rasmussen, MD 07/25/19 2321
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
## 2019-07-25 NOTE — Discharge Instructions (Addendum)
You have been diagnosed today with fall, seizure-like activity, alcohol abuse, rib fracture.  At this time there does not appear to be the presence of an emergent medical condition, however there is always the potential for conditions to change. Please read and follow the below instructions.  Please return to the Emergency Department immediately for any new or worsening symptoms. Please be sure to follow up with your Primary Care Provider within one week regarding your visit today; please call their office to schedule an appointment even if you are feeling better for a follow-up visit. Please take your seizure medication Dilantin as prescribed to avoid further seizures.  Do not drive or operate machinery, climb ladders, swim or perform other dangerous activities until you are seen by a neurologist to avoid injury to yourself or others.  Call the neurologist at [facility] neurology to establish care if you do not already have a neurologist. Please be sure to drink plenty of water and get plenty of rest to avoid dehydration and to help with your symptoms. Please use the resource guides attached to help with your alcohol abuse.  Get help right away if: You have thoughts about hurting yourself or others. You another seizure You have chest pain or trouble breathing You have any of these symptoms after a seizure: Not being able to speak. Not being able to use a part of your body. Confusion. A bad headache. You do not wake up right after a seizure. You get hurt during a seizure. You have any new/concerning or worsening symptoms  Please read the additional information packets attached to your discharge summary.  Do not take your medicine if  develop an itchy rash, swelling in your mouth or lips, or difficulty breathing; call 911 and seek immediate emergency medical attention if this occurs.

## 2019-07-25 NOTE — ED Notes (Signed)
Pt verbally understood discharge instructions and prescription information. Pt stable and ambulatory upon d/c. Pt given cab voucher.
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
## 2019-07-25 NOTE — ED Notes (Signed)
Patient transported to X-ray 

## 2019-09-28 ENCOUNTER — Emergency Department (HOSPITAL_COMMUNITY)
Admission: EM | Admit: 2019-09-28 | Discharge: 2019-09-28 | Disposition: A | Discharge status: Home / Self Care | Payer: Medicaid Other | Source: Home / Self Care

## 2019-09-28 ENCOUNTER — Other Ambulatory Visit: Payer: Self-pay

## 2019-09-28 ENCOUNTER — Encounter (HOSPITAL_COMMUNITY): Payer: Self-pay | Admitting: Emergency Medicine

## 2019-09-28 ENCOUNTER — Emergency Department (HOSPITAL_COMMUNITY)
Admission: EM | Admit: 2019-09-28 | Discharge: 2019-09-28 | Disposition: A | Discharge status: Home / Self Care | Payer: Medicaid Other | Attending: Emergency Medicine | Admitting: Emergency Medicine

## 2019-09-28 DIAGNOSIS — Y939 Activity, unspecified: Secondary | ICD-10-CM | POA: Insufficient documentation

## 2019-09-28 DIAGNOSIS — W19XXXA Unspecified fall, initial encounter: Secondary | ICD-10-CM | POA: Insufficient documentation

## 2019-09-28 DIAGNOSIS — F10120 Alcohol abuse with intoxication, uncomplicated: Secondary | ICD-10-CM | POA: Insufficient documentation

## 2019-09-28 DIAGNOSIS — F10929 Alcohol use, unspecified with intoxication, unspecified: Secondary | ICD-10-CM | POA: Diagnosis present

## 2019-09-28 DIAGNOSIS — Y999 Unspecified external cause status: Secondary | ICD-10-CM | POA: Insufficient documentation

## 2019-09-28 DIAGNOSIS — Z5321 Procedure and treatment not carried out due to patient leaving prior to being seen by health care provider: Secondary | ICD-10-CM | POA: Insufficient documentation

## 2019-09-28 DIAGNOSIS — Y929 Unspecified place or not applicable: Secondary | ICD-10-CM | POA: Insufficient documentation

## 2019-09-28 HISTORY — DX: Alcohol abuse, uncomplicated: F10.10
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
## 2019-09-28 NOTE — ED Notes (Signed)
Patient urinated in the floor. Patient beligerant and cussing at staff. Patient is not cooperative and refusing to put on dry scrub pants.

## 2019-09-28 NOTE — ED Notes (Signed)
Patient stated he was leaving. Attempted several times to get the patient to return to his room. Patient refused. EDPA aware.

## 2019-09-28 NOTE — ED Notes (Signed)
Pt came out of room stating that he was leaving. Pt ambulated out of ED.

## 2019-12-13 ENCOUNTER — Encounter (HOSPITAL_COMMUNITY): Payer: Self-pay | Admitting: Emergency Medicine

## 2019-12-13 ENCOUNTER — Other Ambulatory Visit: Payer: Self-pay

## 2019-12-13 ENCOUNTER — Emergency Department (HOSPITAL_COMMUNITY): Payer: Medicaid Other

## 2019-12-13 ENCOUNTER — Emergency Department (HOSPITAL_COMMUNITY)
Admission: EM | Admit: 2019-12-13 | Discharge: 2019-12-22 | Disposition: A | Discharge status: Home / Self Care | Payer: Medicaid Other | Attending: Emergency Medicine | Admitting: Emergency Medicine

## 2019-12-13 DIAGNOSIS — Y939 Activity, unspecified: Secondary | ICD-10-CM | POA: Insufficient documentation

## 2019-12-13 DIAGNOSIS — S0990XA Unspecified injury of head, initial encounter: Secondary | ICD-10-CM | POA: Diagnosis present

## 2019-12-13 DIAGNOSIS — S0181XA Laceration without foreign body of other part of head, initial encounter: Secondary | ICD-10-CM | POA: Insufficient documentation

## 2019-12-13 DIAGNOSIS — Z20822 Contact with and (suspected) exposure to covid-19: Secondary | ICD-10-CM | POA: Diagnosis not present

## 2019-12-13 DIAGNOSIS — F17210 Nicotine dependence, cigarettes, uncomplicated: Secondary | ICD-10-CM | POA: Diagnosis not present

## 2019-12-13 DIAGNOSIS — W19XXXA Unspecified fall, initial encounter: Secondary | ICD-10-CM | POA: Insufficient documentation

## 2019-12-13 DIAGNOSIS — F10920 Alcohol use, unspecified with intoxication, uncomplicated: Secondary | ICD-10-CM | POA: Diagnosis not present

## 2019-12-13 DIAGNOSIS — Y999 Unspecified external cause status: Secondary | ICD-10-CM | POA: Diagnosis not present

## 2019-12-13 DIAGNOSIS — Y929 Unspecified place or not applicable: Secondary | ICD-10-CM | POA: Insufficient documentation

## 2019-12-13 MED ORDER — LORAZEPAM 1 MG PO TABS
0.0000 mg | ORAL_TABLET | Freq: Four times a day (QID) | ORAL | Status: AC
  Filled 2019-12-13: qty 2

## 2019-12-13 MED ORDER — THIAMINE HCL 100 MG PO TABS
100.0000 mg | ORAL_TABLET | Freq: Every day | ORAL | Status: DC
  Administered 2019-12-15 – 2019-12-22 (×8): 100 mg via ORAL
  Filled 2019-12-13 (×8): qty 1

## 2019-12-13 MED ORDER — LORAZEPAM 2 MG/ML IJ SOLN: 0.0000 mg | Freq: Four times a day (QID) | INTRAMUSCULAR | Status: AC

## 2019-12-13 MED ORDER — LORAZEPAM 2 MG/ML IJ SOLN: 0.0000 mg | Freq: Two times a day (BID) | INTRAMUSCULAR | Status: AC

## 2019-12-13 MED ORDER — LORAZEPAM 1 MG PO TABS: 0.0000 mg | ORAL_TABLET | Freq: Two times a day (BID) | ORAL | Status: AC

## 2019-12-13 MED ORDER — THIAMINE HCL 100 MG/ML IJ SOLN: 100.0000 mg | Freq: Every day | INTRAMUSCULAR | Status: DC

## 2019-12-13 NOTE — Social Work (Signed)
EDCSW met with Pt at bedside. 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 TOC team will follow up in the morning.
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
## 2019-12-13 NOTE — ED Notes (Addendum)
Patient placed himself back on floor, stating that he was having a seizure.  Patient continues to shake.  Patient placed back in wheelchair.   Patient became verbally abusive with staff, stating that he was hungry and needing a bed.  This RN stated to patient that he will wait his turn or he can leave.  Patient became very loud and was swearing at this RN.  Patient taken outside at this time.

## 2019-12-13 NOTE — ED Provider Notes (Signed)
[facility] EMERGENCY DEPARTMENT Provider Note   CSN: [id number] Arrival date & time: 12/13/19  2016     History Chief Complaint  Patient presents with   [facility] Laceration    Benjamin Peterson is a 64 y.o. male.  HPI      Benjamin Peterson is a 64 y.o. male, with a history of blindness, alcohol abuse, hepatitis C, schizophrenia, presenting to the ED for evaluation following a fall that occurred shortly prior to arrival.  Patient states he tripped and fell, hitting his head. Admits to alcohol intoxication.  When asked about his alcohol consumption, he states, "I drink whenever I can; wine, beer, liquor."  He states he does not remember how much he has had today. Denies LOC, neck/back pain, chest pain, shortness of breath, cough, abdominal pain, extremity injuries, numbness, weakness, nausea/vomiting, or any other complaints.    Past Medical History:  Diagnosis Date   Alcohol abuse    Cancer ([place])    liver   Hepatitis C    Schizophrenia ([place])    Seizures ([place])     Patient Active Problem List   Diagnosis Date Noted   Alcohol abuse with alcohol-induced mood disorder ([place]) 04/17/2019   Alcohol abuse     History reviewed. No pertinent surgical history.     No family history on file.  Social History   Tobacco Use   Smoking status: Heavy Tobacco Smoker    Packs/day: 1.00    Types: Cigarettes   Smokeless tobacco: Never Used  Substance Use Topics   Alcohol use: Yes    Comment: 2.5 gallons of liquor   Drug use: No    Home Medications Prior to Admission medications   Medication Sig Start Date End Date Taking? Authorizing Provider  phenytoin (DILANTIN) 100 MG ER capsule Take 1 capsule (100 mg total) by mouth 3 (three) times daily. 07/25/19   [name], PA-C    Allergies    Patient has no known allergies.  Review of Systems   Review of Systems  Constitutional: Negative for chills, diaphoresis and fever.  Respiratory:  Negative for cough and shortness of breath.   Cardiovascular: Negative for chest pain.  Gastrointestinal: Negative for abdominal pain, nausea and vomiting.  Musculoskeletal: Negative for back pain and neck pain.  Skin: Positive for wound.  Neurological: Negative for dizziness, syncope, weakness, light-headedness, numbness and headaches.  All other systems reviewed and are negative.   Physical Exam Updated Vital Signs BP 101/84 (BP Location: Left Arm)    Pulse 75    Temp 97.6 F (36.4 C) (Oral)    Resp 20    SpO2 98%   Physical Exam Vitals and nursing note reviewed.  Constitutional:      General: He is not in acute distress.    Appearance: He is well-developed. He is not diaphoretic.  HENT:     Head: Normocephalic.     Comments: Approximately 2 cm laceration above the left eyebrow.  No gaping wound.  No swelling, deformity, or instability.  The rest of the scalp and face were examined without evidence of injury.    Mouth/Throat:     Mouth: Mucous membranes are moist.     Pharynx: Oropharynx is clear.  Eyes:     Conjunctiva/sclera: Conjunctivae normal.  Cardiovascular:     Rate and Rhythm: Normal rate and regular rhythm.     Pulses: Normal pulses.          Radial pulses are 2+ on  the right side and 2+ on the left side.       Posterior tibial pulses are 2+ on the right side and 2+ on the left side.     Heart sounds: Normal heart sounds.     Comments: Tactile temperature in the extremities appropriate and equal bilaterally. Pulmonary:     Effort: Pulmonary effort is normal. No respiratory distress.     Breath sounds: Normal breath sounds.  Abdominal:     Palpations: Abdomen is soft.     Tenderness: There is no abdominal tenderness. There is no guarding.  Musculoskeletal:     Cervical back: Neck supple.     Right lower leg: No edema.     Left lower leg: No edema.     Comments: Normal motor function intact in all extremities. No midline spinal tenderness.  The major joints of  the upper and lower extremities were ranged without noted pain, deformity, or instability. Overall trauma exam performed without any abnormalities noted other than those mentioned.  Lymphadenopathy:     Cervical: No cervical adenopathy.  Skin:    General: Skin is warm and dry.  Neurological:     Mental Status: He is alert.     Comments: No noted acute cognitive deficit. Sensation grossly intact to light touch in the extremities.   Grip strengths equal bilaterally.   Strength 5/5 in all extremities.  Cranial nerves III-XII grossly intact.  EOMs were tested through verbal commands as the patient could not follow a finger due to his blindness. Handles oral secretions without noted difficulty.  No noted phonation or speech deficit. No facial droop.   Psychiatric:        Mood and Affect: Mood and affect normal.        Speech: Speech normal.        Behavior: Behavior normal.     ED Results / Procedures / Treatments   Labs (all labs ordered are listed, but only abnormal results are displayed) Labs Reviewed - No data to display  EKG None  Radiology CT Head Wo Contrast  Result Date: 12/13/2019 CLINICAL DATA:  Status post fall. EXAM: CT HEAD WITHOUT CONTRAST TECHNIQUE: Contiguous axial images were obtained from the base of the skull through the vertex without intravenous contrast. COMPARISON:  July 25, 2019 FINDINGS: Brain: There is mild cerebral atrophy with widening of the extra-axial spaces and ventricular dilatation. There are areas of decreased attenuation within the white matter tracts of the supratentorial brain, consistent with microvascular disease changes. Vascular: No hyperdense vessel or unexpected calcification. Skull: Normal. Negative for fracture or focal lesion. Sinuses/Orbits: No acute finding. Other: There is mild right periorbital soft tissue swelling. IMPRESSION: 1. Generalized cerebral atrophy. 2. Mild right periorbital soft tissue swelling without evidence of an acute  fracture or acute intracranial abnormality. Electronically Signed   By: Virgina Norfolk M.D.   On: 12/13/2019 23:17   CT Cervical Spine Wo Contrast  Result Date: 12/13/2019 CLINICAL DATA:  Status post trauma. EXAM: CT CERVICAL SPINE WITHOUT CONTRAST TECHNIQUE: Multidetector CT imaging of the cervical spine was performed without intravenous contrast. Multiplanar CT image reconstructions were also generated. COMPARISON:  July 25, 2019 FINDINGS: Alignment: Normal. Skull base and vertebrae: No acute fracture. No primary bone lesion or focal pathologic process. Soft tissues and spinal canal: No prevertebral fluid or swelling. No visible canal hematoma. Disc levels: C2-3: There is moderate severity end plate spondylosis. Mild disc space narrowing is seen. Bilateral facet hypertrophy is noted. Normal central canal and  intervertebral neuroforamina. C3-4: There is moderate to marked severity end plate spondylosis. Mild disc space narrowing is seen. Bilateral facet hypertrophy is noted. Normal central canal and intervertebral neuroforamina. C4-5: There is moderate to marked severity end plate spondylosis. Mild disc space narrowing is seen. Bilateral facet hypertrophy is noted. Normal central canal and intervertebral neuroforamina. C5-6: There is marked severity end plate spondylosis. Mild to moderate severity disc space narrowing is seen. Bilateral facet hypertrophy is noted. Normal central canal and intervertebral neuroforamina. C6-7: There is marked severity end plate spondylosis. Moderate severity disc space narrowing is seen. Bilateral facet hypertrophy is noted. Normal central canal and intervertebral neuroforamina. C7-T1: There is marked severity end plate spondylosis. Mild to moderate severity disc space narrowing is seen. Bilateral facet hypertrophy is noted. Normal central canal and intervertebral neuroforamina. Upper chest: Negative. Other: None. IMPRESSION: 1. Moderate to marked severity multilevel  degenerative changes without evidence of acute fracture within the cervical spine. Electronically Signed   By: Virgina Norfolk M.D.   On: 12/13/2019 23:21    Procedures .Marland KitchenLaceration Repair  Date/Time: 12/13/2019 11:40 PM Performed by: Lorayne Bender, PA-C Authorized by: Lorayne Bender, PA-C   Consent:    Consent obtained:  Verbal   Consent given by:  Patient   Risks discussed:  Need for additional repair, infection, poor cosmetic result, poor wound healing and pain Anesthesia (see MAR for exact dosages):    Anesthesia method:  None Laceration details:    Location:  Face   Face location:  L eyebrow   Length (cm):  2 Repair type:    Repair type:  Simple Pre-procedure details:    Preparation:  Patient was prepped and draped in usual sterile fashion Exploration:    Wound exploration: wound explored through full range of motion and entire depth of wound probed and visualized   Treatment:    Area cleansed with:  Saline   Amount of cleaning:  Standard   Irrigation solution:  Sterile saline   Irrigation method:  Syringe Skin repair:    Repair method:  Tissue adhesive Approximation:    Approximation:  Close Post-procedure details:    Dressing:  Open (no dressing)   Patient tolerance of procedure:  Tolerated well, no immediate complications   (including critical care time)  Medications Ordered in ED Medications  LORazepam (ATIVAN) injection 0-4 mg (has no administration in time range)    Or  LORazepam (ATIVAN) tablet 0-4 mg (has no administration in time range)  LORazepam (ATIVAN) injection 0-4 mg (has no administration in time range)    Or  LORazepam (ATIVAN) tablet 0-4 mg (has no administration in time range)  thiamine tablet 100 mg (has no administration in time range)    Or  thiamine (B-1) injection 100 mg (has no administration in time range)    ED Course  I have reviewed the triage vital signs and the nursing notes.  Pertinent labs & imaging results that were  available during my care of the patient were reviewed by me and considered in my medical decision making (see chart for details).    MDM Rules/Calculators/A&P                      Patient presents with fall while intoxicated.  No acute abnormalities on CTs. Patient requesting additional resources.  Social work consulted. I discussed the patient's disposition with Education officer, museum.  We agreed the choice that would be the safest for the patient would be for him to stay  here in the ED until morning.  Then he could be given transportation to [facility] and provided with additional help and resources. Diet and CIWA protocol orders placed.     Final Clinical Impression(s) / ED Diagnoses Final diagnoses:  Fall, initial encounter  Laceration of forehead, initial encounter  Alcoholic intoxication without complication [facility])    Rx / DC Orders ED Discharge Orders    None       Layla Maw 12/13/19 2344    Lucrezia Starch, MD 12/14/19 1510

## 2019-12-13 NOTE — ED Notes (Signed)
Patient has urinated on himself and placed himself on the floor.  Patient was placed back in wheelchair.  Patient stating he is having seizures while speaking with me, patient remains C&A, having conversation with me at this time.

## 2019-12-13 NOTE — ED Notes (Signed)
Patient brought back into ED.  Patient stating he is having a seizure, speaking in full sentences and shaking both of his hands while holding them out in front of himself.  Patient asking for a cigarette.
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
## 2019-12-14 NOTE — ED Notes (Signed)
Pt. Is calm and cooperative. Pt updates in regards to plan for placement. VS WDL. Pt comfortable and resting.

## 2019-12-14 NOTE — ED Notes (Signed)
Assisted pt to bathroom. Gait stable. Pt independent after set up

## 2019-12-14 NOTE — ED Notes (Signed)
Case manager just came by and reported that she had been told that the pt had been discharged  I  Was told in report that he was waiting for nursing home placement

## 2019-12-14 NOTE — ED Notes (Signed)
The pt is eating with utensils ans sometimes with his heands

## 2019-12-14 NOTE — ED Notes (Signed)
Pt sleeping. 
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
## 2019-12-14 NOTE — ED Notes (Signed)
Pt moved to a regular hospital bed for comfort  Pt asked for a cup of coffee  Given  Sleeping intermittently

## 2019-12-14 NOTE — ED Notes (Signed)
Pt ate everything on his plate

## 2019-12-15 LAB — SARS CORONAVIRUS 2 (TAT 6-24 HRS): SARS Coronavirus 2: NEGATIVE

## 2019-12-15 NOTE — ED Provider Notes (Signed)
Patient presented to the ED on 1/26.  His initial evaluation at that time was precipitated by a fall while intoxicated.  He reported significant regular alcohol use and abuse.  He has a longstanding history of schizophrenia and concurrent alcohol abuse.  Additionally, he is currently homeless.  Laceration was repaired on 1/26 and social work consultation was initiated.     Valarie Merino, MD 12/15/19 [phone]
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
## 2019-12-15 NOTE — ED Notes (Signed)
Lunch Tray Ordered @ 1026. 

## 2019-12-15 NOTE — ED Notes (Signed)
Pt ambulating with PT. Gown and linens changed

## 2019-12-15 NOTE — ED Notes (Signed)
Dinner tray ordered.

## 2019-12-15 NOTE — Progress Notes (Addendum)
2:30pm: CSW submitted all necessary documentation to [employer]SR including MD note, FL2, and RN progress notes.   11am: CSW has faxed patient's clinicals to several facilities within the local area for review.  9am: CSW requested PT/OT evaluation for this patient to determine the level of care he will require. CSW will complete fax out process once skilled evaluations are completed.  Madilyn Fireman, MSW, LCSW-A Transitions of Care  Clinical Social Worker  [facility] Emergency Departments  Medical ICU [phone]

## 2019-12-15 NOTE — NC FL2 (Signed)
  [place] LEVEL OF CARE SCREENING TOOL     IDENTIFICATION  Patient Name: Benjamin Peterson Birthdate: 09/20/1956 Sex: male Admission Date (Current Location): 12/13/2019  [place] and [place] Number:  Kathleen Argue [id number] [place] and Address:  [facility], [place] [address], [place], [place] [address]      Provider Number: [id number]   Attending Physician Name and Address:  Default, Provider, MD  Relative Name and Phone Number:       Current Level of Care: Hospital Recommended Level of Care: [place] Prior Approval Number:    Date Approved/Denied:   PASRR Number:    Discharge Plan: SNF    Current Diagnoses: Patient Active Problem List   Diagnosis Date Noted  . Alcohol abuse with alcohol-induced mood disorder ([place]) 04/17/2019  . Alcohol abuse     Orientation RESPIRATION BLADDER Height & Weight     Self, Time, Situation, Place  Normal Incontinent Weight:   165 Height:   5\' 6"   BEHAVIORAL SYMPTOMS/MOOD NEUROLOGICAL BOWEL NUTRITION STATUS  Dangerous to self, others or property   Continent Diet  AMBULATORY STATUS COMMUNICATION OF NEEDS Skin   Limited Assist Verbally Bruising, Skin abrasions                       Personal Care Assistance Level of Assistance  Bathing, Feeding, Dressing Bathing Assistance: Maximum assistance Feeding assistance: Limited assistance Dressing Assistance: Maximum assistance Total Care Assistance: Maximum assistance   Functional Limitations Info  Sight, Hearing, Speech Sight Info: Impaired Hearing Info: Adequate Speech Info: Adequate    SPECIAL CARE FACTORS FREQUENCY  PT (By licensed PT), OT (By licensed OT)     PT Frequency: 5x weekly OT Frequency: 5x weekly            Contractures Contractures Info: Not present    Additional Factors Info                  Current Medications (12/15/2019):  This is the current hospital active medication list Current  Facility-Administered Medications  Medication Dose Route Frequency Provider Last Rate Last Admin  . LORazepam (ATIVAN) injection 0-4 mg  0-4 mg Intravenous Q6H Joy, Shawn C, PA-C   Stopped at 12/15/19 0600   Or  . LORazepam (ATIVAN) tablet 0-4 mg  0-4 mg Oral Q6H Joy, Shawn C, PA-C      . [START ON 12/16/2019] LORazepam (ATIVAN) injection 0-4 mg  0-4 mg Intravenous Q12H Joy, Shawn C, PA-C       Or  . [START ON 12/16/2019] LORazepam (ATIVAN) tablet 0-4 mg  0-4 mg Oral Q12H Joy, Shawn C, PA-C      . thiamine tablet 100 mg  100 mg Oral Daily Joy, Shawn C, PA-C   100 mg at 12/15/19 [id number]   Or  . thiamine (B-1) injection 100 mg  100 mg Intravenous Daily Joy, Shawn C, PA-C       Current Outpatient Medications  Medication Sig Dispense Refill  . phenytoin (DILANTIN) 100 MG ER capsule Take 1 capsule (100 mg total) by mouth 3 (three) times daily. (Patient not taking: Reported on 12/14/2019) 90 capsule 0     Discharge Medications: Please see discharge summary for a list of discharge medications.  Relevant Imaging Results:  Relevant Lab Results:   Additional Information SSN: [id number]  Archie Endo, LCSW

## 2019-12-15 NOTE — ED Notes (Signed)
Dinner Tray Ordered @ 1823.  

## 2019-12-16 NOTE — ED Notes (Signed)
SW/CM Placement Breakfast ordered
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
## 2019-12-16 NOTE — ED Notes (Signed)
PT shaved and given a snack.

## 2019-12-16 NOTE — Progress Notes (Addendum)
12pm: CSW sent over patient's clinicals to [facility] and [facility] for review.  CSW also requested the assistance of [facility] clinical liaison Audelia Acton to identify options for possible placement.  9am: CSW sent patient's clinicals to [facility] for review.  Additional MD notes regarding patient's current substance use have been uploaded to PASSR portal.  Madilyn Fireman, MSW, LCSW-A Transitions of Care  Clinical Social Worker  [facility] Emergency Departments  Medical ICU [phone]

## 2019-12-16 NOTE — Progress Notes (Signed)
CSW facilitated phone assessment with PASSR representative Jacqualin Combes and Pt at bedside.

## 2019-12-16 NOTE — ED Provider Notes (Signed)
No acute complaints overnight.  Patient sleeping comfortably this morning.  Vitals reviewed, unremarkable.  Pending housing placement / SW assistance.  No evidence of acute withdrawal syndrome.   Wyvonnia Dusky, MD 12/16/19 [phone]

## 2019-12-16 NOTE — ED Notes (Signed)
Lunch Tray Ordered @ 1028. 

## 2019-12-17 ENCOUNTER — Other Ambulatory Visit: Payer: Self-pay

## 2019-12-17 NOTE — ED Notes (Signed)
Breakfast ordered 

## 2019-12-17 NOTE — ED Notes (Signed)
Breakfast tray set up for pt. Coffee given as requested. Pt able to feed himself.

## 2019-12-17 NOTE — Progress Notes (Signed)
CSW spoke with Gerald Stabs with [facility]. They have not found an available med at this time.

## 2019-12-17 NOTE — ED Notes (Signed)
Lunch ordered at 11:28

## 2019-12-18 NOTE — ED Notes (Signed)
SW/CM Placement  Breakfast ordered

## 2019-12-19 NOTE — Progress Notes (Addendum)
3pm: CSW reached out to Santiago Glad at [facility] to request a review of this patient, will obtain fax number and send information over for review.  2pm: CSW spoke with Gerald Stabs at [facility] who states there are no beds available at that facility today.  CSW spoke with Maris Berger at [facility] who states the patient is still under review at this time.  9am: CSW received patient's PASSR number - [id number] F expiration date 01/18/2020.  CSW attempted to reach [place] at [facility] to request a review of this patient, a voicemail was left requesting a return call.  CSW attempted to reach Renee at [facility] in [place] to request a review of this patient, a voicemail was left requesting a return call.   7:50am: CSW attempted to reach [place] at [facility] in [place] and [place] at [facility] to determine if a decision was made regarding the referral made for this patient last week, voicemail's were left requesting a return call.   CSW spoke with Maris Berger at [facility] to request a review of this patient, she is agreeable. CSW faxed clinical information over for review.  Madilyn Fireman, MSW, LCSW-A Transitions of Care  Clinical Social Worker  [facility] Emergency Departments  Medical ICU [phone]

## 2019-12-19 NOTE — ED Notes (Signed)
Pt's dinner ordered °

## 2019-12-19 NOTE — ED Notes (Signed)
Breakfast ordered 

## 2019-12-19 NOTE — ED Notes (Signed)
Lunch Tray Ordered @ 1049. °
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
## 2019-12-20 NOTE — ED Notes (Signed)
Pt provided w/ hot chocolate per patient request.

## 2019-12-20 NOTE — ED Notes (Signed)
Breakfast ordered 

## 2019-12-20 NOTE — ED Notes (Signed)
Call bell given to pt - voiced understanding to call staff when needs to go to the restroom so pt can keep mobile as pt lies in bed rather than getting up during the day. Pt voiced understanding. Urinal left at bedside as requested d/t states has urinary urgency.

## 2019-12-20 NOTE — ED Notes (Signed)
PT w/pt.  

## 2019-12-20 NOTE — ED Notes (Signed)
Diet was ordered for Lunch. 

## 2019-12-20 NOTE — Progress Notes (Addendum)
2pm: CSW received notification from [place] at [facility] who states the patient was not offered a bed. CSW spoke with [place] who states patient is under review at [facility] in [place].  CSW spoke with Judeen Hammans at [facility] in [place] to request a review of this patient, Judeen Hammans agreeable. CSW faxed clinicals to [phone].  9am: CSW spoke with [place] of [place] facilities to request her assistance with identifying options for review for this patient. CSW faxed patient's information over for review.  7:30am: CSW spoke with patient at bedside to provide him with an update regarding his discharge plan. Patient expressed gratitude for all the help he is receiving. Patient requested a warm blanket, NT to obtain one for patient.  CSW will continue faxing patient's clinical information out for review to obtain a bed offer.  Madilyn Fireman, MSW, LCSW-A Transitions of Care  Clinical Social Worker  [facility] Emergency Departments  Medical ICU [phone]

## 2019-12-20 NOTE — ED Notes (Signed)
Pt stated that if someone would please let him make a very important call to disability at 1100am on 12/21/2019

## 2019-12-21 NOTE — ED Notes (Signed)
Assisted pt with making a telephone call.

## 2019-12-21 NOTE — Progress Notes (Signed)
CSW spoke with Gerald Stabs at [facility] who is reviewing this patient with the admission team at this time.  CSW spoke with Museum/gallery conservator at [facility] in [place] who stated the referral was received and will be reviewed ASAP.  Madilyn Fireman, MSW, LCSW-A Transitions of Care  Clinical Social Worker  [facility] Emergency Departments  Medical ICU [phone]

## 2019-12-22 LAB — RESPIRATORY PANEL BY RT PCR (FLU A&B, COVID)
Influenza A by PCR: NEGATIVE
Influenza B by PCR: NEGATIVE
SARS Coronavirus 2 by RT PCR: NEGATIVE

## 2019-12-22 NOTE — ED Provider Notes (Signed)
Patient going to [place], [place], MD 12/22/19 [phone]

## 2019-12-22 NOTE — ED Notes (Signed)
Pt has been pleasant and helpful this morning. Mr Benjamin Peterson needs minimal assistance with ambulation because he uses his cane from home.

## 2019-12-22 NOTE — ED Notes (Signed)
Pt listening to TV at this time. No complaints. Offered snack and drink. Pt pleasant and cooperative and carries on a pleasant conversation. Denies pain. Pt states that he is very grateful to be here.

## 2019-12-22 NOTE — ED Provider Notes (Signed)
SW informs me patient is able to be placed, needs rapid covid. 2 hour test ordered.   Sherwood Gambler, MD 12/22/19 [phone]

## 2019-12-22 NOTE — Progress Notes (Addendum)
12pm: Patient will go to room 41 at [facility] and [place]. Patient will transported by the Transportation Department Theressa Millard) today, with a scheduled pick up time for 2pm. The number to call for report is [phone], RN's name is [employer].  CSW made RN aware.   10:40am: CSW spoke with patient at bedside to inform of him discharge plan. Patient expressed gratitude for CSW involvement and assistance.   CSW informed patient's RN Luellen Pucker of discharge plan. TOC supervisor Denyse Amass is assisting with transportation for this patient's discharge.  8am: CSW spoke with [place] of [facility] who requested updated RN notes for facility review. [place] stated the patient has been accepted at [facility] in [place], [place].  [place] spoke with Carmin Richmond at [facility] who confirmed this patient's acceptance. Claiborne Billings requested all clinicals be sent to her to obtain the patient's medical history. All clinicals were faxed to [facility]. The patient will require a new COVID test before discharge.   CSW spoke with Dr. Regenia Skeeter to have rapid COVID test ordered for discharge.  Madilyn Fireman, MSW, LCSW-A Transitions of Care  Clinical Social Worker  [facility] Emergency Departments  Medical ICU [phone]

## 2019-12-22 NOTE — ED Notes (Signed)
Breakfast ordered 

## 2019-12-22 NOTE — ED Notes (Addendum)
Gave pt coffee and cheese and crackers. Helped pt with TV channels. Pt pleasant. No complaints.

## 2020-05-07 ENCOUNTER — Emergency Department (HOSPITAL_COMMUNITY): Payer: Medicaid Other

## 2020-05-07 ENCOUNTER — Other Ambulatory Visit: Payer: Self-pay

## 2020-05-07 ENCOUNTER — Encounter (HOSPITAL_COMMUNITY): Payer: Self-pay

## 2020-05-07 ENCOUNTER — Emergency Department (HOSPITAL_COMMUNITY)
Admission: EM | Admit: 2020-05-07 | Discharge: 2020-05-07 | Disposition: A | Discharge status: Home / Self Care | Payer: Medicaid Other | Attending: Emergency Medicine | Admitting: Emergency Medicine

## 2020-05-07 DIAGNOSIS — Z23 Encounter for immunization: Secondary | ICD-10-CM | POA: Diagnosis not present

## 2020-05-07 DIAGNOSIS — Y939 Activity, unspecified: Secondary | ICD-10-CM | POA: Insufficient documentation

## 2020-05-07 DIAGNOSIS — Y999 Unspecified external cause status: Secondary | ICD-10-CM | POA: Insufficient documentation

## 2020-05-07 DIAGNOSIS — S0181XA Laceration without foreign body of other part of head, initial encounter: Secondary | ICD-10-CM | POA: Diagnosis not present

## 2020-05-07 DIAGNOSIS — Y92410 Unspecified street and highway as the place of occurrence of the external cause: Secondary | ICD-10-CM | POA: Diagnosis not present

## 2020-05-07 DIAGNOSIS — R4182 Altered mental status, unspecified: Secondary | ICD-10-CM | POA: Diagnosis not present

## 2020-05-07 DIAGNOSIS — Z8505 Personal history of malignant neoplasm of liver: Secondary | ICD-10-CM | POA: Diagnosis not present

## 2020-05-07 DIAGNOSIS — F17210 Nicotine dependence, cigarettes, uncomplicated: Secondary | ICD-10-CM | POA: Diagnosis not present

## 2020-05-07 DIAGNOSIS — F10129 Alcohol abuse with intoxication, unspecified: Secondary | ICD-10-CM | POA: Diagnosis not present

## 2020-05-07 DIAGNOSIS — S0183XA Puncture wound without foreign body of other part of head, initial encounter: Secondary | ICD-10-CM | POA: Diagnosis present

## 2020-05-07 MED ORDER — TETANUS-DIPHTH-ACELL PERTUSSIS 5-2.5-18.5 LF-MCG/0.5 IM SUSP
0.5000 mL | Freq: Once | INTRAMUSCULAR | Status: AC
  Administered 2020-05-07: 0.5 mL via INTRAMUSCULAR
  Filled 2020-05-07: qty 0.5

## 2020-05-07 NOTE — ED Triage Notes (Signed)
Patient told Probation officer to "leave him alone during triage.
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
## 2020-05-07 NOTE — Progress Notes (Signed)
CSW provided pt with transport home via [employer] with [employer]  and witnessed pt getting into the cab voluntarily.  Driver is aware pt is blind and agreeable with assisting pt to his door at his home.  Pt states he has a key and can safely enter his home on his own.  Please reconsult if future social work needs arise.  CSW signing off, as social work intervention is no longer needed.  Alphonse Guild. Carlen Fils  MSW, LCSW, LCAS, CCS Transitions of Care Clinical Social Worker Care Coordination Department Ph: [phone]

## 2020-05-07 NOTE — ED Notes (Signed)
Attempted to contact wife to come pick pt up. Phone not in service. Spoke with Roderic Palau SW who will arrange a cab, pt has verified he has key to get into his apartment.

## 2020-05-07 NOTE — ED Notes (Addendum)
Patient refused lab draw. Cussing at staff, Triage RN, Adela Lank, made aware.
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
## 2020-05-07 NOTE — ED Triage Notes (Addendum)
Per EMS- Patient has ETOH on board. Patient has a puncture wound to the right forehead. Patient states he is blind.

## 2020-05-20 ENCOUNTER — Other Ambulatory Visit: Payer: Self-pay

## 2020-05-20 ENCOUNTER — Emergency Department (HOSPITAL_COMMUNITY)
Admission: EM | Admit: 2020-05-20 | Discharge: 2020-05-20 | Disposition: A | Discharge status: Home / Self Care | Payer: Medicaid Other | Attending: Emergency Medicine | Admitting: Emergency Medicine

## 2020-05-20 ENCOUNTER — Encounter (HOSPITAL_COMMUNITY): Payer: Self-pay | Admitting: Emergency Medicine

## 2020-05-20 DIAGNOSIS — H5789 Other specified disorders of eye and adnexa: Secondary | ICD-10-CM | POA: Diagnosis present

## 2020-05-20 DIAGNOSIS — Z5321 Procedure and treatment not carried out due to patient leaving prior to being seen by health care provider: Secondary | ICD-10-CM | POA: Insufficient documentation

## 2020-05-20 NOTE — ED Notes (Signed)
Pt started to play loud music and talking to other PT. Told PT to turn music off. He stated that he would like to leave. Removed arm band from PT.PT now leaving.

## 2020-05-20 NOTE — ED Triage Notes (Signed)
Pt to triage via [facility].  Reports bilateral eye irritation with redness to L eye.  Reports ETOH use.

## 2020-07-01 ENCOUNTER — Other Ambulatory Visit: Payer: Self-pay

## 2020-07-01 ENCOUNTER — Emergency Department (HOSPITAL_COMMUNITY): Payer: Medicaid Other

## 2020-07-01 ENCOUNTER — Encounter (HOSPITAL_COMMUNITY): Payer: Self-pay | Admitting: Emergency Medicine

## 2020-07-01 ENCOUNTER — Emergency Department (HOSPITAL_COMMUNITY)
Admission: EM | Admit: 2020-07-01 | Discharge: 2020-07-01 | Disposition: A | Discharge status: Home / Self Care | Payer: Medicaid Other | Attending: Emergency Medicine | Admitting: Emergency Medicine

## 2020-07-01 DIAGNOSIS — Y929 Unspecified place or not applicable: Secondary | ICD-10-CM | POA: Diagnosis not present

## 2020-07-01 DIAGNOSIS — Z20822 Contact with and (suspected) exposure to covid-19: Secondary | ICD-10-CM | POA: Insufficient documentation

## 2020-07-01 DIAGNOSIS — L089 Local infection of the skin and subcutaneous tissue, unspecified: Secondary | ICD-10-CM

## 2020-07-01 DIAGNOSIS — Y939 Activity, unspecified: Secondary | ICD-10-CM | POA: Diagnosis not present

## 2020-07-01 DIAGNOSIS — Y999 Unspecified external cause status: Secondary | ICD-10-CM | POA: Insufficient documentation

## 2020-07-01 DIAGNOSIS — F17210 Nicotine dependence, cigarettes, uncomplicated: Secondary | ICD-10-CM | POA: Diagnosis not present

## 2020-07-01 DIAGNOSIS — S90811A Abrasion, right foot, initial encounter: Secondary | ICD-10-CM | POA: Insufficient documentation

## 2020-07-01 DIAGNOSIS — H5712 Ocular pain, left eye: Secondary | ICD-10-CM | POA: Diagnosis not present

## 2020-07-01 DIAGNOSIS — S99921A Unspecified injury of right foot, initial encounter: Secondary | ICD-10-CM | POA: Diagnosis present

## 2020-07-01 DIAGNOSIS — X58XXXA Exposure to other specified factors, initial encounter: Secondary | ICD-10-CM | POA: Diagnosis not present

## 2020-07-01 LAB — COMPREHENSIVE METABOLIC PANEL
ALT: 37 U/L (ref 0–44)
AST: 97 U/L — ABNORMAL HIGH (ref 15–41)
Albumin: 3 g/dL — ABNORMAL LOW (ref 3.5–5.0)
Alkaline Phosphatase: 102 U/L (ref 38–126)
Anion gap: 13 (ref 5–15)
BUN: 7 mg/dL — ABNORMAL LOW (ref 8–23)
CO2: 24 mmol/L (ref 22–32)
Calcium: 8.4 mg/dL — ABNORMAL LOW (ref 8.9–10.3)
Chloride: 101 mmol/L (ref 98–111)
Creatinine, Ser: 0.7 mg/dL (ref 0.61–1.24)
GFR calc Af Amer: >60 mL/min (ref >60)
GFR calc non Af Amer: >60 mL/min (ref >60)
Glucose, Bld: 123 mg/dL — ABNORMAL HIGH (ref 70–99)
Potassium: 3.5 mmol/L (ref 3.5–5.1)
Sodium: 138 mmol/L (ref 135–145)
Total Bilirubin: 4.1 mg/dL — ABNORMAL HIGH (ref 0.3–1.2)
Total Protein: 8.1 g/dL (ref 6.5–8.1)

## 2020-07-01 LAB — URINALYSIS, ROUTINE W REFLEX MICROSCOPIC
Bilirubin Urine: NEGATIVE
Glucose, UA: NEGATIVE mg/dL
Ketones, ur: 5 mg/dL — AB
Leukocytes,Ua: NEGATIVE
Nitrite: NEGATIVE
Protein, ur: NEGATIVE mg/dL
Specific Gravity, Urine: 1.013 (ref 1.005–1.030)
pH: 8 (ref 5.0–8.0)

## 2020-07-01 LAB — CBC WITH DIFFERENTIAL/PLATELET
Abs Immature Granulocytes: 0 10*3/uL (ref 0.00–0.07)
Basophils Absolute: 0 10*3/uL (ref 0.0–0.1)
Basophils Relative: 0 %
Eosinophils Absolute: 0 10*3/uL (ref 0.0–0.5)
Eosinophils Relative: 1 %
HCT: 41.3 % (ref 39.0–52.0)
Hemoglobin: 13.2 g/dL (ref 13.0–17.0)
Immature Granulocytes: 0 %
Lymphocytes Relative: 21 %
Lymphs Abs: 0.6 10*3/uL — ABNORMAL LOW (ref 0.7–4.0)
MCH: 30.8 pg (ref 26.0–34.0)
MCHC: 32 g/dL (ref 30.0–36.0)
MCV: 96.3 fL (ref 80.0–100.0)
Monocytes Absolute: 0.4 10*3/uL (ref 0.1–1.0)
Monocytes Relative: 13 %
Neutro Abs: 1.8 10*3/uL (ref 1.7–7.7)
Neutrophils Relative %: 65 %
Platelets: 35 10*3/uL — ABNORMAL LOW (ref 150–400)
RBC: 4.29 MIL/uL (ref 4.22–5.81)
RDW: 15.7 % — ABNORMAL HIGH (ref 11.5–15.5)
WBC: 2.7 10*3/uL — ABNORMAL LOW (ref 4.0–10.5)
nRBC: 0 % (ref 0.0–0.2)

## 2020-07-01 LAB — PROTIME-INR
INR: 1.4 — ABNORMAL HIGH (ref 0.8–1.2)
Prothrombin Time: 16.5 seconds — ABNORMAL HIGH (ref 11.4–15.2)

## 2020-07-01 LAB — SARS CORONAVIRUS 2 BY RT PCR (HOSPITAL ORDER, PERFORMED IN ~~LOC~~ HOSPITAL LAB): SARS Coronavirus 2: NEGATIVE

## 2020-07-01 LAB — LACTIC ACID, PLASMA
Lactic Acid, Venous: 2.5 mmol/L (ref 0.5–1.9)
Lactic Acid, Venous: 1.5 mmol/L (ref 0.5–1.9)

## 2020-07-01 MED ORDER — LACTATED RINGERS IV BOLUS
1000.0000 mL | Freq: Once | INTRAVENOUS | Status: AC
  Administered 2020-07-01: 1000 mL via INTRAVENOUS

## 2020-07-01 MED ORDER — THIAMINE HCL 100 MG/ML IJ SOLN
100.0000 mg | Freq: Every day | INTRAMUSCULAR | Status: DC
  Administered 2020-07-01: 100 mg via INTRAVENOUS
  Filled 2020-07-01: qty 2

## 2020-07-01 MED ORDER — THIAMINE HCL 100 MG PO TABS: 100.0000 mg | ORAL_TABLET | Freq: Every day | ORAL | Status: DC

## 2020-07-01 MED ORDER — ERYTHROMYCIN 5 MG/GM OP OINT
1.0000 "application " | TOPICAL_OINTMENT | Freq: Four times a day (QID) | OPHTHALMIC | Status: DC
  Administered 2020-07-01: 1 via OPHTHALMIC
  Filled 2020-07-01: qty 3.5

## 2020-07-01 MED ORDER — FLUORESCEIN SODIUM 1 MG OP STRP
1.0000 | ORAL_STRIP | Freq: Once | OPHTHALMIC | Status: AC
  Administered 2020-07-01: 1 via OPHTHALMIC
  Filled 2020-07-01: qty 1

## 2020-07-01 MED ORDER — TETRACAINE HCL 0.5 % OP SOLN
1.0000 [drp] | Freq: Once | OPHTHALMIC | Status: AC
  Administered 2020-07-01: 1 [drp] via OPHTHALMIC
  Filled 2020-07-01: qty 4

## 2020-07-01 MED ORDER — BACITRACIN ZINC 500 UNIT/GM EX OINT
1.0000 "application " | TOPICAL_OINTMENT | Freq: Two times a day (BID) | CUTANEOUS | Status: DC
  Administered 2020-07-01: 1 via TOPICAL
  Filled 2020-07-01: qty 4.5

## 2020-07-01 NOTE — ED Provider Notes (Signed)
[facility] EMERGENCY DEPARTMENT Provider Note   CSN: [id number] Arrival date & time: 07/01/20  1224     History Chief Complaint  Patient presents with  . foot infection  . eye infection    Benjamin Peterson is a 64 y.o. male.  64yo M w/ PMH including alcohol abuse, liver CA, Hep C, seizures, schizophrenia who p/w multiple complaints. 2-3 weeks ago, pt began having blurry vision and pain in L eye. He tried putting ear drops in it which did not improve symptoms. No problems with right eye. PT reports he had surgery on his eyes last month, cataract surgery in R eye and "transfusion" in L eye. He is not able to describe surgery further. He has not followed w/ eye doctor. 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  R foot: pt began having atraumatic R foot pain 1 week ago. He noticed a wound on foot. He reports ~1 week of fevers and chills, nausea and vomiting including vom earlier today. He reports having 1 beer today. No diarrhea, constipation, SOB, or cough.   The history is provided by the patient.       Past Medical History:  Diagnosis Date  . Alcohol abuse   . Cancer ([place])    liver  . Hepatitis C   . Schizophrenia ([place])   . Seizures [facility])     Patient Active Problem List   Diagnosis Date Noted  . Alcohol abuse with alcohol-induced mood disorder ([place]) 04/17/2019  . Alcohol abuse     History reviewed. No pertinent surgical history.     No family history on file.  Social History   Tobacco Use  . Smoking status: Heavy Tobacco Smoker    Packs/day: 1.00    Types: Cigarettes  . Smokeless tobacco: Never Used  Vaping Use  . Vaping Use: Never used  Substance Use Topics  . Alcohol use: Yes    Comment: 2.5 gallons of liquor  . Drug use: No    Home Medications Prior to Admission medications   Medication Sig Start Date End Date Taking? Authorizing Provider  phenytoin (DILANTIN)  100 MG ER capsule Take 1 capsule (100 mg total) by mouth 3 (three) times daily. Patient not taking: Reported on 12/14/2019 07/25/19   [name], PA-C    Allergies    Patient has no known allergies.  Review of Systems   Review of Systems All other systems reviewed and are negative except that which was mentioned in HPI  Physical Exam Updated Vital Signs BP (!) 171/108   Pulse 82   Temp 98.1 F (36.7 C) (Oral)   Resp 15   SpO2 98%   Physical Exam Vitals and nursing note reviewed.  Constitutional:      General: He is not in acute distress.    Appearance: He is well-developed.  HENT:     Head: Normocephalic and atraumatic.  Eyes:     General: Lids are normal.        Right eye: No discharge.        Left eye: No discharge.     Extraocular Movements: Extraocular movements intact.     Pupils: Pupils are equal, round, and reactive to light.     Left eye: No corneal abrasion or fluorescein uptake.     Slit lamp exam:    Left eye: No corneal ulcer or foreign body.  Comments: L conjunctival injection without drainage, no periorbital edema or erythema  Cardiovascular:     Rate and Rhythm: Normal rate and regular rhythm.     Heart sounds: Normal heart sounds. No murmur heard.   Pulmonary:     Effort: Pulmonary effort is normal.     Breath sounds: Normal breath sounds.  Abdominal:     General: Bowel sounds are normal. There is no distension.     Palpations: Abdomen is soft.     Tenderness: There is no abdominal tenderness.  Musculoskeletal:     Cervical back: Neck supple.  Skin:    General: Skin is warm and dry.     Comments: Small abrasion proximal dorsal R foot with faint surrounding erythema, no drainage or swelling  Neurological:     Mental Status: He is alert and oriented to person, place, and time.     Comments: Fluent speech  Psychiatric:        Judgment: Judgment normal.     ED Results / Procedures / Treatments   Labs (all labs ordered are listed, but  only abnormal results are displayed) Labs Reviewed  LACTIC ACID, PLASMA - Abnormal; Notable for the following components:      Result Value   Lactic Acid, Venous 2.5 (*)    All other components within normal limits  COMPREHENSIVE METABOLIC PANEL - Abnormal; Notable for the following components:   Glucose, Bld 123 (*)    BUN 7 (*)    Calcium 8.4 (*)    Albumin 3.0 (*)    AST 97 (*)    Total Bilirubin 4.1 (*)    All other components within normal limits  CBC WITH DIFFERENTIAL/PLATELET - Abnormal; Notable for the following components:   WBC 2.7 (*)    RDW 15.7 (*)    Platelets 35 (*)    Lymphs Abs 0.6 (*)    All other components within normal limits  PROTIME-INR - Abnormal; Notable for the following components:   Prothrombin Time 16.5 (*)    INR 1.4 (*)    All other components within normal limits  URINALYSIS, ROUTINE W REFLEX MICROSCOPIC - Abnormal; Notable for the following components:   Hgb urine dipstick SMALL (*)    Ketones, ur 5 (*)    Bacteria, UA FEW (*)    All other components within normal limits  SARS CORONAVIRUS 2 BY RT PCR (HOSPITAL ORDER, [facility])  CULTURE, BLOOD (ROUTINE X 2)  CULTURE, BLOOD (ROUTINE X 2)  URINE CULTURE  LACTIC ACID, PLASMA    EKG None  Radiology DG Chest Port 1 View  Result Date: 07/01/2020 CLINICAL DATA:  Fever and chills EXAM: PORTABLE CHEST 1 VIEW COMPARISON:  None. FINDINGS: The heart size and mediastinal contours are within normal limits. Aortic knob calcifications. Both lungs are clear. The visualized skeletal structures are unremarkable. IMPRESSION: No active disease. Electronically Signed   By: Prudencio Pair M.D.   On: 07/01/2020 19:43    Procedures Procedures (including critical care time)  Medications Ordered in ED Medications  thiamine tablet 100 mg (100 mg Oral Not Given 07/01/20 1846)    Or  thiamine (B-1) injection 100 mg ( Intravenous See Alternative 07/01/20 1846)  bacitracin ointment 1  application (has no administration in time range)  erythromycin ophthalmic ointment 1 application (has no administration in time range)  lactated ringers bolus 1,000 mL (0 mLs Intravenous Stopped 07/01/20 2057)  fluorescein ophthalmic strip 1 strip (1 strip Left Eye Given 07/01/20  1843)  tetracaine (PONTOCAINE) 0.5 % ophthalmic solution 1 drop (1 drop Both Eyes Given 07/01/20 1842)       ED Course  I have reviewed the triage vital signs and the nursing notes.  Pertinent labs & imaging results that were available during my care of the patient were reviewed by me and considered in my medical decision making (see chart for details).    MDM Rules/Calculators/A&P                          Patient with conjunctival injection of the left eye but no signs of abrasion, ulcer, periorbital or orbital cellulitis.  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  I have strongly encouraged him to follow-up with his surgeon as I have no records to better understand what his previous conditions or recent surgery were.  Will provide erythromycin ophthalmic ointment because of conjunctival injection and irritation.  regarding his foot, he has faint erythema but no significant surrounding cellulitis changes, I feel that topical antibiotics are appropriate as it mainly looks like an abrasion injury.  I have discussed wound care and return precautions.  Regarding his reported fever and chills, he has been afebrile for hours here with reassuring vital signs.  Chest x-ray is clear, screening lab work shows lactate of 2.5 that cleared with fluids, chronic leukopenia and thrombocytopenia likely due to underlying liver disease, INR 1.4 also related to liver disease.  UA without infection.  He is well-appearing at time of discharge and understands reasons to return. Final Clinical Impression(s) / ED Diagnoses Final diagnoses:  Left eye pain  Foot  abrasion, infected, right, initial encounter    Rx / DC Orders ED Discharge Orders    None       Devere Brem, Wenda Overland, MD 07/01/20 2118

## 2020-07-01 NOTE — Discharge Instructions (Addendum)
IT IS VERY IMPORTANT FOR YOU TO FOLLOW UP WITH YOUR EYE DOCTOR WHO DID YOUR SURGERY. USE ANTIBIOTIC EYE OINTMENT 4 TIMES A DAY FOR 5 DAYS. USE BACITRACIN ON R FOOT WOUND ONCE OR TWICE DAILY FOR UP TO 7 DAYS OR UNTIL WOUND HEALED.

## 2020-07-01 NOTE — ED Notes (Signed)
Provider bedside.

## 2020-07-01 NOTE — ED Triage Notes (Signed)
[place] [facility] [place].  Pt reports L eye redness and drainage x 3 weeks. Reports wound to R foot x 1 week.  Pt also reports fever and chills.

## 2020-07-02 LAB — URINE CULTURE: Culture: 10000 — AB

## 2020-07-06 LAB — CULTURE, BLOOD (ROUTINE X 2)
Culture: NO GROWTH
Special Requests: ADEQUATE

## 2021-02-14 ENCOUNTER — Encounter: Payer: Self-pay | Admitting: Family Medicine

## 2021-02-14 ENCOUNTER — Other Ambulatory Visit: Payer: Self-pay

## 2021-02-14 ENCOUNTER — Ambulatory Visit (INDEPENDENT_AMBULATORY_CARE_PROVIDER_SITE_OTHER): Payer: Medicaid Other | Admitting: Family Medicine

## 2021-02-14 VITALS — BP 128/77 | HR 93 | Ht 66.0 in | Wt 196.2 lb

## 2021-02-14 DIAGNOSIS — R600 Localized edema: Secondary | ICD-10-CM

## 2021-02-14 DIAGNOSIS — Z23 Encounter for immunization: Secondary | ICD-10-CM

## 2021-02-14 DIAGNOSIS — R0989 Other specified symptoms and signs involving the circulatory and respiratory systems: Secondary | ICD-10-CM | POA: Diagnosis not present

## 2021-02-14 NOTE — Progress Notes (Signed)
lo

## 2021-02-14 NOTE — Patient Instructions (Addendum)
It was good to see you today.  Thank you for coming in.  I am concerned you have a blood clot in your leg.   I am ordering labs as well as an ultrasound of your leg for tomorrow, they will call you with the appointment details.   I will follow-up with you for the results of these.  Be Well, Dr Manus Rudd

## 2021-02-15 ENCOUNTER — Ambulatory Visit (HOSPITAL_COMMUNITY)
Admission: RE | Admit: 2021-02-15 | Discharge: 2021-02-15 | Disposition: A | Discharge status: Home / Self Care | Payer: Medicaid Other | Source: Ambulatory Visit | Attending: Family Medicine | Admitting: Family Medicine

## 2021-02-15 DIAGNOSIS — R0989 Other specified symptoms and signs involving the circulatory and respiratory systems: Secondary | ICD-10-CM | POA: Insufficient documentation

## 2021-02-15 LAB — CBC WITH DIFFERENTIAL/PLATELET
Basophils Absolute: 0 10*3/uL (ref 0.0–0.2)
Basos: 1 %
EOS (ABSOLUTE): 0.3 10*3/uL (ref 0.0–0.4)
Eos: 7 %
Hematocrit: 38.6 % (ref 37.5–51.0)
Hemoglobin: 13.6 g/dL (ref 13.0–17.7)
Immature Grans (Abs): 0 10*3/uL (ref 0.0–0.1)
Immature Granulocytes: 0 %
Lymphocytes Absolute: 1.9 10*3/uL (ref 0.7–3.1)
Lymphs: 43 %
MCH: 32.6 pg (ref 26.6–33.0)
MCHC: 35.2 g/dL (ref 31.5–35.7)
MCV: 93 fL (ref 79–97)
Monocytes Absolute: 0.4 10*3/uL (ref 0.1–0.9)
Monocytes: 10 %
Neutrophils Absolute: 1.7 10*3/uL (ref 1.4–7.0)
Neutrophils: 39 %
Platelets: 54 10*3/uL — CL (ref 150–450)
RBC: 4.17 x10E6/uL (ref 4.14–5.80)
RDW: 13.6 % (ref 11.6–15.4)
WBC: 4.3 10*3/uL (ref 3.4–10.8)

## 2021-02-15 LAB — COMPREHENSIVE METABOLIC PANEL
ALT: 21 IU/L (ref 0–44)
AST: 47 IU/L — ABNORMAL HIGH (ref 0–40)
Albumin/Globulin Ratio: 0.7 — ABNORMAL LOW (ref 1.2–2.2)
Albumin: 3.2 g/dL — ABNORMAL LOW (ref 3.8–4.8)
Alkaline Phosphatase: 178 IU/L — ABNORMAL HIGH (ref 44–121)
BUN/Creatinine Ratio: 11 (ref 10–24)
BUN: 9 mg/dL (ref 8–27)
Bilirubin Total: 1.6 mg/dL — ABNORMAL HIGH (ref 0.0–1.2)
CO2: 21 mmol/L (ref 20–29)
Calcium: 8.9 mg/dL (ref 8.6–10.2)
Chloride: 105 mmol/L (ref 96–106)
Creatinine, Ser: 0.79 mg/dL (ref 0.76–1.27)
Globulin, Total: 4.7 g/dL — ABNORMAL HIGH (ref 1.5–4.5)
Glucose: 111 mg/dL — ABNORMAL HIGH (ref 65–99)
Potassium: 3.8 mmol/L (ref 3.5–5.2)
Sodium: 142 mmol/L (ref 134–144)
Total Protein: 7.9 g/dL (ref 6.0–8.5)
eGFR: 99 mL/min/{1.73_m2} (ref >59)

## 2021-02-15 LAB — PROTIME-INR
INR: 1.3 — ABNORMAL HIGH (ref 0.9–1.2)
Prothrombin Time: 13 s — ABNORMAL HIGH (ref 9.1–12.0)

## 2021-02-16 DIAGNOSIS — R600 Localized edema: Secondary | ICD-10-CM | POA: Insufficient documentation

## 2021-02-16 DIAGNOSIS — I872 Venous insufficiency (chronic) (peripheral): Secondary | ICD-10-CM | POA: Insufficient documentation
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
## 2021-02-16 NOTE — Assessment & Plan Note (Addendum)
Significant swelling and pain going on in left leg for 1 month now.  Trace pitting edema in right leg, 2+ pitting edema with significant swelling in left leg up to calf, tender to palpation, pulses 2+ bilaterally, cap refill <2, no erythema or sign of necrosis or gangrene on either leg.  No signs of infection or arterial compromise.  High concern for DVT given significant unilateral swelling and pain in leg.  No concerning symptoms of PE. - Deferred rest of Establish care visit given concerning findings - Obtain CMP, CBC, PT/INR - Ordered and scheduled Venous doppler ultrasound of left leg - Will follow-up with patient on results and schedule future visit accordingly

## 2021-04-26 ENCOUNTER — Encounter: Payer: Self-pay | Admitting: Family Medicine

## 2021-04-26 ENCOUNTER — Other Ambulatory Visit: Payer: Self-pay

## 2021-04-26 ENCOUNTER — Ambulatory Visit (INDEPENDENT_AMBULATORY_CARE_PROVIDER_SITE_OTHER): Payer: Medicaid Other | Admitting: Family Medicine

## 2021-04-26 VITALS — BP 134/72 | HR 87 | Wt 186.0 lb

## 2021-04-26 DIAGNOSIS — F17200 Nicotine dependence, unspecified, uncomplicated: Secondary | ICD-10-CM

## 2021-04-26 DIAGNOSIS — T50905A Adverse effect of unspecified drugs, medicaments and biological substances, initial encounter: Secondary | ICD-10-CM

## 2021-04-26 DIAGNOSIS — I872 Venous insufficiency (chronic) (peripheral): Secondary | ICD-10-CM

## 2021-04-26 DIAGNOSIS — K061 Gingival enlargement: Secondary | ICD-10-CM

## 2021-04-26 DIAGNOSIS — R569 Unspecified convulsions: Secondary | ICD-10-CM

## 2021-04-26 DIAGNOSIS — Z87820 Personal history of traumatic brain injury: Secondary | ICD-10-CM

## 2021-04-26 DIAGNOSIS — K739 Chronic hepatitis, unspecified: Secondary | ICD-10-CM | POA: Diagnosis present

## 2021-04-26 DIAGNOSIS — K7460 Unspecified cirrhosis of liver: Secondary | ICD-10-CM | POA: Diagnosis not present

## 2021-04-26 HISTORY — DX: Adverse effect of unspecified drugs, medicaments and biological substances, initial encounter: T50.905A

## 2021-04-26 HISTORY — DX: Personal history of traumatic brain injury: Z87.820

## 2021-04-26 HISTORY — DX: Adverse effect of unspecified drugs, medicaments and biological substances, initial encounter: K06.1

## 2021-04-26 HISTORY — DX: Unspecified cirrhosis of liver: K74.60

## 2021-04-26 HISTORY — DX: Nicotine dependence, unspecified, uncomplicated: F17.200

## 2021-04-26 MED ORDER — LEVETIRACETAM 500 MG PO TABS: 500.0000 mg | ORAL_TABLET | Freq: Two times a day (BID) | ORAL | 3 refills | Status: AC

## 2021-04-26 MED ORDER — LEVETIRACETAM 500 MG PO TABS: 500.0000 mg | ORAL_TABLET | Freq: Two times a day (BID) | ORAL | 3 refills | Status: DC

## 2021-04-26 NOTE — Progress Notes (Addendum)
SUBJECTIVE:   CHIEF COMPLAINT / HPI: Follow-up for multiple problems  Patient has complex medical history involving cataracts in both eyes, Alcohol Use Disorder, Cirrohsis, Thrombocytopenia, Seizure Disorder, Hepatitis C infection and possible Liver Cancer.  Had establish care visit previously, though focus put on Unilateral leg swelling given concern for DVT.  Patient has had falls due to issues with balance.  Unsure of cause though thinks it may be due to seizure disorder.  Was previously seen by [facility] for this.  On Dilantin but stopped given gingival hyperplasia.  Appears to have recommended switching to Keppra.  Patient indicates has cut back alcohol use.  Indicates was previously seen in [facility] clinic several years ago for Hepatitis C management and received Interferon.  No records seen in Care Everywhere.  Patient indicates has good support system in [place] whom he currently lives with and daughter.  Patient indicates can call daughter with medical results/information if unable to get in touch with.  Indicated placing importance on Hepatitis C and Seizure management though wishing to follow-up other concerns in 1 month.  PERTINENT  PMH / PSH: As stated Above  OBJECTIVE:   BP 134/72   Pulse 87   Wt 186 lb (84.4 kg)   SpO2 97%   BMI 30.02 kg/m    Physical Exam Constitutional:      General: He is not in acute distress.    Appearance: He is not ill-appearing.  HENT:     Head: Normocephalic.  Cardiovascular:     Rate and Rhythm: Normal rate and regular rhythm.     Pulses: Normal pulses.     Heart sounds: Murmur heard.     Comments: Systolic murmur, loudest at upper right sternal border Pulmonary:     Effort: Pulmonary effort is normal.     Breath sounds: Normal breath sounds.  Abdominal:     Palpations: Abdomen is soft. There is no fluid wave.     Tenderness: There is no abdominal tenderness.  Musculoskeletal:        General: Swelling  present.     Comments: Swelling and pain in left leg, non-pitting  Skin:    General: Skin is warm.     Coloration: Skin is not jaundiced.     Comments: Bronzing present  Neurological:     Mental Status: He is alert.     Comments: Able to ambulate overall without issue, some favoriting of left leg     ASSESSMENT/PLAN:   Cirrhosis of liver ([place]) Patient indicates has history of Hep C infection.  History of alcohol use.  Has significantly cut back on usage, praised patient for efforts.  Transaminases at normal levels on CMP from last visit.  No fluid wave or significant on ascites on physical exam.    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  Patient not currently on any treatment and having seizures.  Possible cause of falls.  Spoke with Attending Dr McDiarmid who indicated fine to start Keppra 500 mg BID given safe profile. 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  Tender to palpation, non-pitting edema  Had Doppler ultrasound in 02/2021. No  evidence of deep vein thrombosis  of superficial venous thrombosis, and No cystic structure found in the popliteal fossa. - Recommended compression stockings for swelling and pain - follow up this issue and others in 1 month visit  Other issues for follow-up:  Lag pain, back pain, heart murmur   Note:  Call Daughter at [phone] if unable to reach patient.  Delora Fuel, MD [place]

## 2021-04-26 NOTE — Patient Instructions (Signed)
It was good to see you today.  Thank you for coming in.  I am obtaining Hepatitis labs.  I will place a referral to Infectious Disease after these labs return.  I recommend [facility] at [phone] to be seen for your seizures.  I am prescribing you Keppra 500 mg to take twice a day for seizures.  Be Well, Dr Manus Rudd

## 2021-04-27 LAB — HEPATITIS B SURFACE ANTIGEN: Hepatitis B Surface Ag: NEGATIVE

## 2021-04-27 MED ORDER — JOBST 20-30MMHG COMPRESSION SM MISC: Status: AC

## 2021-04-27 NOTE — Assessment & Plan Note (Addendum)
Last seen by Neurologist in 09/2020.  Patient not currently on any treatment and having seizures.  Likely cause of falls.  Spoke with Attending Dr McDiarmid who indicated fine to start Keppra 500 mg BID given safe profile. - Started on Keppra 500 mg BID for seizures - Gave phone number for Neurologist office to schedule follow-up appointment

## 2021-04-27 NOTE — Assessment & Plan Note (Addendum)
Patient indicates has history of Hep C infection.  History of alcohol use.  Has significantly cut back on usage, praised patient for efforts.  Transaminases at normal levels on CMP from last visit.  No fluid wave or significant on ascites on physical exam.    - Obtain Hep B antigen and Hep C with reflex  - Plan to place referral to infectious disease following results of Hepatitis labs

## 2021-04-27 NOTE — Assessment & Plan Note (Signed)
Still swolen and causing pain and difficulty with ambulation.  Tender to palpation, non-pitting edema  Had Doppler ultrasound in 02/2021. No evidence of deep vein thrombosis  of superficial venous thrombosis, and No cystic structure found in the popliteal fossa. - Recommended compression stockings for swelling and pain - follow up this issue and others in 1 month visit

## 2021-05-01 ENCOUNTER — Telehealth: Payer: Self-pay

## 2021-05-01 DIAGNOSIS — K732 Chronic active hepatitis, not elsewhere classified: Secondary | ICD-10-CM

## 2021-05-01 LAB — HCV RT-PCR, QUANT (NON-GRAPH)
HCV log10: 5.577 log10 IU/mL
Hepatitis C Quantitation: 378000 IU/mL

## 2021-05-01 LAB — HCV AB W REFLEX TO QUANT PCR: HCV Ab: >11 s/co ratio — ABNORMAL HIGH (ref 0.0–0.9)

## 2021-05-01 NOTE — Telephone Encounter (Signed)
Patients daughter Estill Bamberg calls nurse line stating she saw results on mychart. Estill Bamberg would like for PCP to call her and discuss.   Estill [phone]

## 2021-05-02 NOTE — Telephone Encounter (Signed)
Patient's daughter returns call to nurse line regarding lab work.   Please advise.   Talbot Grumbling, RN

## 2021-05-06 ENCOUNTER — Telehealth: Payer: Self-pay

## 2021-05-06 NOTE — Telephone Encounter (Signed)
Patient's daughter calls nurse line to provide PCP with information on specialist who diagnosed patient with liver cancer.   Provider is [facility] located at [facility]. [address] [place], [place] [address]. Phone number is [phone].  Forwarding information to PCP.   Talbot Grumbling, RN

## 2021-05-07 ENCOUNTER — Telehealth: Payer: Self-pay

## 2021-05-07 ENCOUNTER — Ambulatory Visit (INDEPENDENT_AMBULATORY_CARE_PROVIDER_SITE_OTHER): Payer: Medicaid Other | Admitting: Family

## 2021-05-07 ENCOUNTER — Other Ambulatory Visit (HOSPITAL_COMMUNITY): Payer: Self-pay

## 2021-05-07 ENCOUNTER — Other Ambulatory Visit: Payer: Self-pay

## 2021-05-07 ENCOUNTER — Encounter: Payer: Self-pay | Admitting: Family

## 2021-05-07 VITALS — BP 176/85 | HR 87 | Temp 98.5°F | Wt 182.0 lb

## 2021-05-07 DIAGNOSIS — B182 Chronic viral hepatitis C: Secondary | ICD-10-CM

## 2021-05-07 DIAGNOSIS — K7460 Unspecified cirrhosis of liver: Secondary | ICD-10-CM

## 2021-05-07 NOTE — Telephone Encounter (Signed)
[facility] Patient Teacher, English as a foreign language completed.    The patient is insured through [facility] and has a 3.00 copay.  Medication will need a PA.    We will continue to follow to see if copay assistance is needed.  Ileene Patrick, [facility] Patient [facility] for Infectious Disease Phone: [phone] Fax:  [phone]

## 2021-05-07 NOTE — Patient Instructions (Signed)
Nice to see you.  We will check your lab work today and order an ultrasound to check your liver.   Plan for follow up pending blood work and ultrasound results.

## 2021-05-07 NOTE — Progress Notes (Signed)
Subjective:    Patient ID: Benjamin Peterson, male    DOB: [birth year], 65 y.o.   MRN: [id number]  Chief Complaint  Patient presents with   New Patient (Initial Visit)    New  patient , discuss Hep C  , had treatment in past for hep c years going was given injection , was being treated in by GI  pt had a seizure a couple  days ago due not having meds     HPI:  Benjamin Peterson is a 65 y.o. 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 He is here today with his Benjamin Peterson present.   Benjamin Peterson was seen in Primary Care with lab work with positive Hepatitis C antibody and Hepatitis C RNA level of 378,000. Hepatitis B Surface Antigen was negative. Referred to ID for Hepatitis C treatment.  Benjamin Peterson was initially diagnosed back in the 67s and received treatment in the 1990s with Interferon. Risk factor for Hepatitis C is blood transfusion prior to 1992 and tattoo. No history of IV drug use or sharing of razors/toothbrushes. No current symptoms and denies abdominal pain, nausea, vomiting, scleral icterus or jaundice. Was previously followed by Benjamin Peterson in [place] where he was diagnosed with liver cancer. Had an ultrasound about 1 year ago and results are not available. Continues to drink alcohol currently.     No Known Allergies    Outpatient Medications Prior to Visit  Medication Sig Dispense Refill   acetaminophen (TYLENOL) 500 MG tablet Take by mouth.     Elastic Bandages & Supports (JOBST KNEE HIGH COMPRESSION SM) MISC For left leg swelling     levETIRAcetam (KEPPRA) 500 MG tablet Take 1 tablet (500 mg total) by mouth 2 (two) times daily. 60 tablet 3   No facility-administered medications prior to visit.     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 No pertinent surgical history.    History reviewed. No pertinent family history.    Social History   Socioeconomic History   Marital status: Married    Spouse name: Not on file   Number of children: Not on file   Years of education: Not on file   Highest education level: Not on file  Occupational History   Not on file  Tobacco Use   Smoking status: Heavy Smoker    Packs/day: 1.00    Pack years: 0.00    Types: Cigarettes   Smokeless tobacco: Never  Vaping Use   Vaping Use: Never used  Substance and Sexual Activity   Alcohol use: Yes    Comment: 2.5 gallons of liquor   Drug use: No   Sexual activity: Not on file  Other Topics Concern   Not on file  Social History Narrative   Not on file   Social Determinants of Health   Financial  Resource Strain: Not on file  Food Insecurity: Not on file  Transportation Needs: Not on file  Physical Activity: Not on file  Stress: Not on file  Social Connections: Not on file  Intimate Partner Violence: Not on file      Review of Systems  Constitutional:  Negative for chills, diaphoresis, fatigue and fever.  Respiratory:  Negative for cough, chest tightness, shortness of breath and wheezing.   Cardiovascular:  Negative for chest pain.  Gastrointestinal:  Negative for abdominal distention, abdominal pain, constipation, diarrhea, nausea and vomiting.  Neurological:  Negative for weakness and headaches.  Hematological:  Does not bruise/bleed easily.      Objective:    BP (!) 176/85   Pulse 87   Temp 98.5 F (36.9 C)   Wt 182 lb  (82.6 kg)   SpO2 95%   BMI 29.38 kg/m  Nursing note and vital signs reviewed.  Physical Exam Constitutional:      General: He is not in acute distress.    Appearance: He is well-developed.  Cardiovascular:     Rate and Rhythm: Normal rate and regular rhythm.     Heart sounds: Normal heart sounds. No murmur heard.   No friction rub. No gallop.  Pulmonary:     Effort: Pulmonary effort is normal. No respiratory distress.     Breath sounds: Normal breath sounds. No wheezing or rales.  Chest:     Chest wall: No tenderness.  Abdominal:     General: Bowel sounds are normal. There is no distension.     Palpations: Abdomen is soft. There is no mass.     Tenderness: There is no abdominal tenderness. There is no guarding or rebound.  Skin:    General: Skin is warm and dry.  Neurological:     Mental Status: He is alert and oriented to person, place, and time.  Psychiatric:        Behavior: Behavior normal.        Thought Content: Thought content normal.        Judgment: Judgment normal.        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 Check lab work and ultrasound today. Need to rule out any potential for [facility] prior to treatment. Will need to follow up with gastroenterology for additional liver care.        Relevant Orders   AFP tumor marker   Hepatic function panel   Protime-INR   Liver Fibrosis,  FibroTest-ActiTest   Hepatitis C genotype   HIV Antibody (routine testing w rflx)   CBC   US ABDOMEN COMPLETE W/ELASTOGRAPHY   Chronic hepatitis C without hepatic coma ([place]) - Primary    Benjamin Peterson is a 65 y/o caucasian gentleman with Chronic Hepatitis C s/p treatment with Interferon in the 1990s with risk factor of blood transfusion prior to 1992 and tattoo. Suspect likely failed  treatment with Interferon as he currently has viral load of 378,000. Per chart he appears to have been previously diagnosed with liver cancer, although no imaging is available for reference. Will check Hepatitis C lab work and obtain an ultrasound to start with and will likely need MRI for confirmation as the presence of [place] can decrease effectiveness of DAA therapy by 50% and may possible worsen HCC. Therefore it is important to rule out [place] prior to starting any Hepatitis C treatment. Discussed the plan of care for blood work today and obtain imaging. Plan for follow up in 1 month or sooner pending lab work and imaging.        Relevant Orders   AFP tumor marker   Hepatic function panel   Protime-INR   Liver Fibrosis, FibroTest-ActiTest   Hepatitis C genotype   HIV Antibody (routine testing w rflx)   CBC   US ABDOMEN COMPLETE W/ELASTOGRAPHY     I am having Benjamin Peterson maintain his levETIRAcetam, JOBST KNEE HIGH COMPRESSION SM, and acetaminophen.  Follow-up: Return in about 1 month (around 06/06/2021).    Terri Piedra, MSN, FNP-C Nurse Practitioner [facility] for Infectious Disease [place] number: [phone]

## 2021-05-07 NOTE — Assessment & Plan Note (Signed)
Benjamin Peterson is a 65 y/o caucasian gentleman with Chronic Hepatitis C s/p treatment with Interferon in the 1990s with risk factor of blood transfusion prior to 1992 and tattoo. Suspect likely failed treatment with Interferon as he currently has viral load of 378,000. Per chart he appears to have been previously diagnosed with liver cancer, although no imaging is available for reference. Will check Hepatitis C lab work and obtain an ultrasound to start with and will likely need MRI for confirmation as the presence of [place] can decrease effectiveness of DAA therapy by 50% and may possible worsen HCC. Therefore it is important to rule out [place] prior to starting any Hepatitis C treatment. Discussed the plan of care for blood work today and obtain imaging. Plan for follow up in 1 month or sooner pending lab work and imaging.

## 2021-05-07 NOTE — Assessment & Plan Note (Signed)
Previously diagnosed with cirrhosis likely from alcohol abuse which he continues to drink. Check lab work and ultrasound today. Need to rule out any potential for [facility] prior to treatment. Will need to follow up with gastroenterology for additional liver care.

## 2021-05-11 LAB — LIVER FIBROSIS, FIBROTEST-ACTITEST
ALT: 39 U/L (ref 9–46)
Alpha-2-Macroglobulin: 253 mg/dL (ref 106–279)
Apolipoprotein A1: 99 mg/dL (ref 94–176)
Bilirubin: 4.7 mg/dL — ABNORMAL HIGH (ref 0.2–1.2)
Fibrosis Score: 0.98
GGT: 96 U/L — ABNORMAL HIGH (ref 3–70)
Haptoglobin: <8 mg/dL — ABNORMAL LOW (ref 43–212)
Necroinflammat ACT Score: 0.43
Reference ID: 3920358

## 2021-05-11 LAB — PROTIME-INR
INR: 1.4 — ABNORMAL HIGH
Prothrombin Time: 13.7 s — ABNORMAL HIGH (ref 9.0–11.5)

## 2021-05-11 LAB — CBC
HCT: 39.1 % (ref 38.5–50.0)
Hemoglobin: 13.4 g/dL (ref 13.2–17.1)
MCH: 32.4 pg (ref 27.0–33.0)
MCHC: 34.3 g/dL (ref 32.0–36.0)
MCV: 94.4 fL (ref 80.0–100.0)
MPV: 11.5 fL (ref 7.5–12.5)
Platelets: 42 10*3/uL — ABNORMAL LOW (ref 140–400)
RBC: 4.14 10*6/uL — ABNORMAL LOW (ref 4.20–5.80)
RDW: 15.2 % — ABNORMAL HIGH (ref 11.0–15.0)
WBC: 2.8 10*3/uL — ABNORMAL LOW (ref 3.8–10.8)

## 2021-05-11 LAB — HEPATIC FUNCTION PANEL
AG Ratio: 0.8 (calc) — ABNORMAL LOW (ref 1.0–2.5)
ALT: 40 U/L (ref 9–46)
AST: 96 U/L — ABNORMAL HIGH (ref 10–35)
Albumin: 3.3 g/dL — ABNORMAL LOW (ref 3.6–5.1)
Alkaline phosphatase (APISO): 103 U/L (ref 35–144)
Bilirubin, Direct: 1.8 mg/dL — ABNORMAL HIGH (ref 0.0–0.2)
Globulin: 4.3 g/dL — ABNORMAL HIGH (ref 1.9–3.7)
Indirect Bilirubin: 3 mg/dL — ABNORMAL HIGH (ref 0.2–1.2)
Total Bilirubin: 4.8 mg/dL — ABNORMAL HIGH (ref 0.2–1.2)
Total Protein: 7.6 g/dL (ref 6.1–8.1)

## 2021-05-11 LAB — AFP TUMOR MARKER: AFP-Tumor Marker: 2.2 ng/mL

## 2021-05-11 LAB — HIV ANTIBODY (ROUTINE TESTING W REFLEX): HIV 1&2 Ab, 4th Generation: NONREACTIVE

## 2021-05-11 LAB — HEPATITIS C GENOTYPE

## 2021-05-13 ENCOUNTER — Ambulatory Visit (HOSPITAL_COMMUNITY)
Admission: RE | Admit: 2021-05-13 | Discharge: 2021-05-13 | Disposition: A | Discharge status: Home / Self Care | Payer: Medicaid Other | Source: Ambulatory Visit | Attending: Family | Admitting: Family

## 2021-05-13 ENCOUNTER — Other Ambulatory Visit: Payer: Self-pay

## 2021-05-13 DIAGNOSIS — K7460 Unspecified cirrhosis of liver: Secondary | ICD-10-CM | POA: Insufficient documentation

## 2021-05-13 DIAGNOSIS — B182 Chronic viral hepatitis C: Secondary | ICD-10-CM

## 2021-05-14 ENCOUNTER — Other Ambulatory Visit (HOSPITAL_COMMUNITY): Payer: Self-pay

## 2021-05-14 NOTE — Progress Notes (Signed)
Hello Benjamin Peterson,  I will start the PA for him tomorrow & if you can send his script to [facility] once it is approved I will call him and mail it out to him.

## 2021-05-15 ENCOUNTER — Telehealth: Payer: Self-pay

## 2021-05-15 ENCOUNTER — Other Ambulatory Visit: Payer: Self-pay | Admitting: Family

## 2021-05-15 ENCOUNTER — Other Ambulatory Visit (HOSPITAL_COMMUNITY): Payer: Self-pay

## 2021-05-15 MED ORDER — SOFOSBUVIR-VELPATASVIR 400-100 MG PO TABS
1.0000 | ORAL_TABLET | Freq: Every day | ORAL | 5 refills | Status: AC
  Filled 2021-05-15 – 2021-05-21 (×2): qty 28, 28d supply, fill #0
  Filled 2021-06-24: qty 28, 28d supply, fill #1

## 2021-05-15 NOTE — Telephone Encounter (Signed)
[facility] Patient Advocate Encounter   Received notification from [place] that prior authorization for Benjamin Peterson is required.   PA submitted on 05/15/21 Key [id number] W Status is pending    [facility] will continue to follow.   Ileene Patrick, [facility] Patient [facility] for Infectious Disease Phone: [phone] Fax:  [phone]

## 2021-05-15 NOTE — Progress Notes (Unsigned)
a 

## 2021-05-16 ENCOUNTER — Other Ambulatory Visit (HOSPITAL_COMMUNITY): Payer: Self-pay

## 2021-05-16 ENCOUNTER — Telehealth: Payer: Self-pay

## 2021-05-16 NOTE — Telephone Encounter (Signed)
[facility] Patient Advocate Encounter  Prior Authorization for Raeanne Gathers has been approved.     Effective dates: 05/16/21 through 08/17/21  Patients co-pay is $3.00.   In 12 weeks I would have to do another PA with medicaid and have updated lab work.  [facility] will continue to follow.  Ileene Patrick, [facility] Patient [facility] for Infectious Disease Phone: [phone] Fax:  [phone]

## 2021-05-21 ENCOUNTER — Other Ambulatory Visit (HOSPITAL_COMMUNITY): Payer: Self-pay

## 2021-06-02 IMAGING — CT CT HEAD WITHOUT CONTRAST
3 of 4 series · 14 of 47 positions shown, 16 images · non-contrast
Comparison: 03/23/2019

CLINICAL DATA: Fall. Blunt head trauma. Altered mental status.
Alcohol intoxication.

EXAM:
CT HEAD WITHOUT CONTRAST
TECHNIQUE: Contiguous axial images were obtained from the base of the skull
through the vertex without intravenous contrast.

[Series 4: head 2.0 h70h · axial · 0.47mm/px · z∈[-106,+28]mm · 8 of 85 slices shown, 10 images]
[im 9/85  brain]
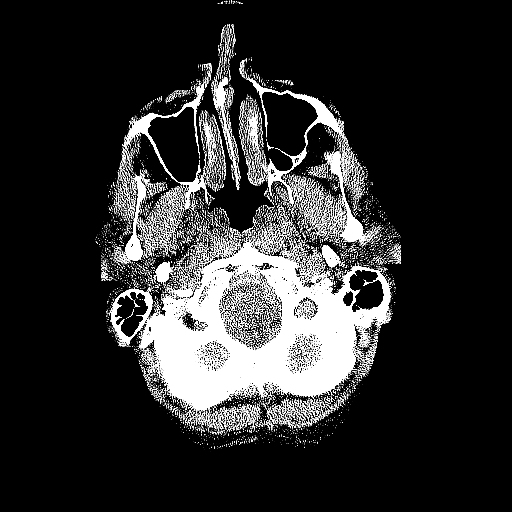
[im 9/85  bone]
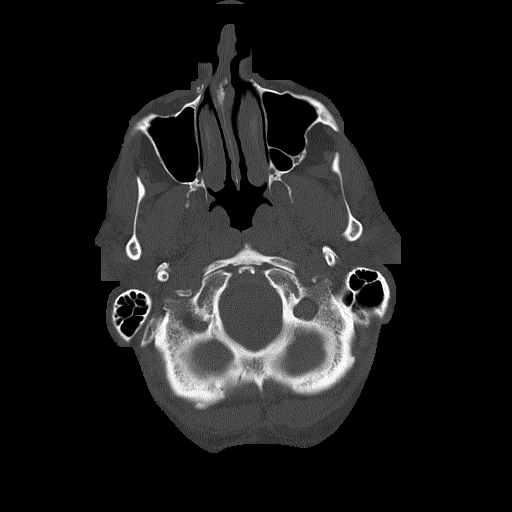
[im 17/85  brain]
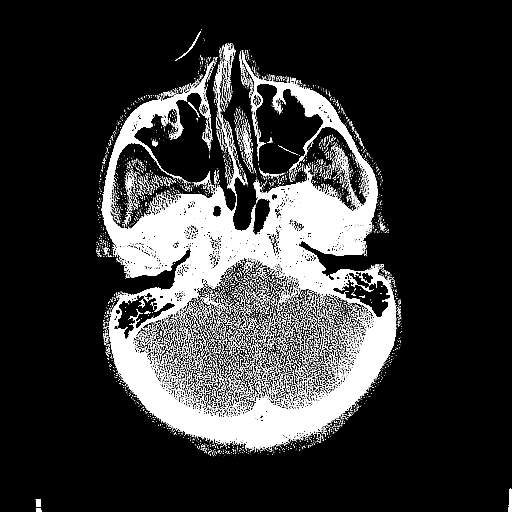
[im 26/85  brain]
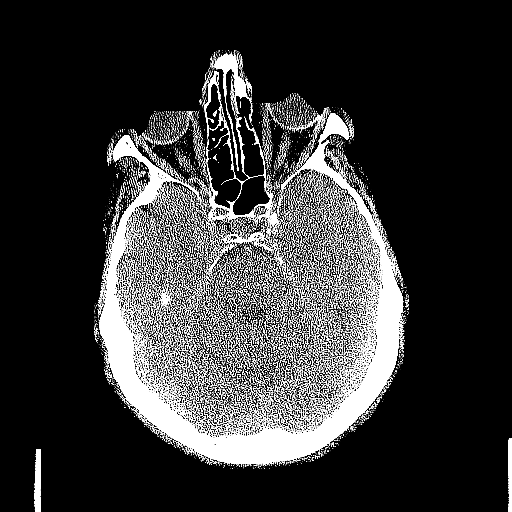
[im 38/85  brain]
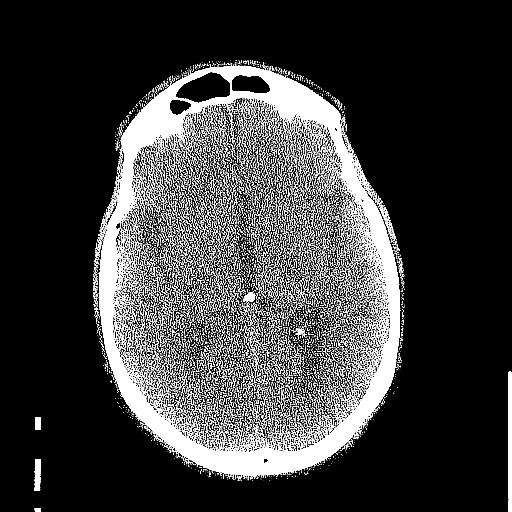
[im 47/85  brain]
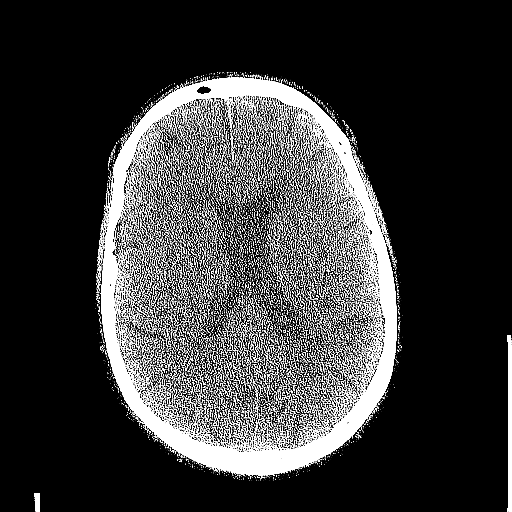
[im 47/85  bone]
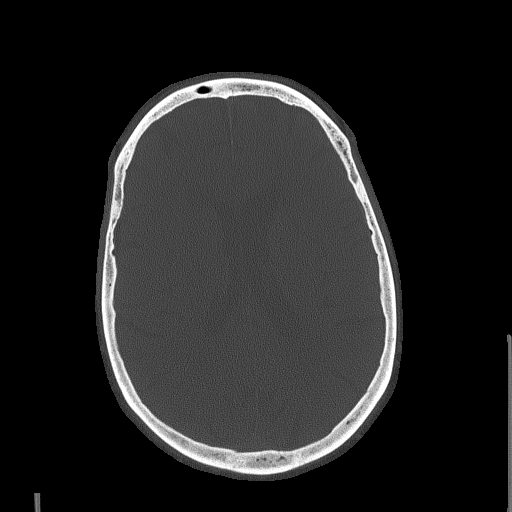
[im 59/85  brain]
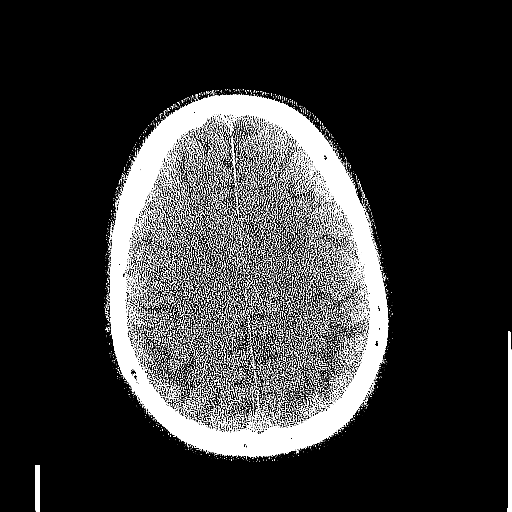
[im 68/85  brain]
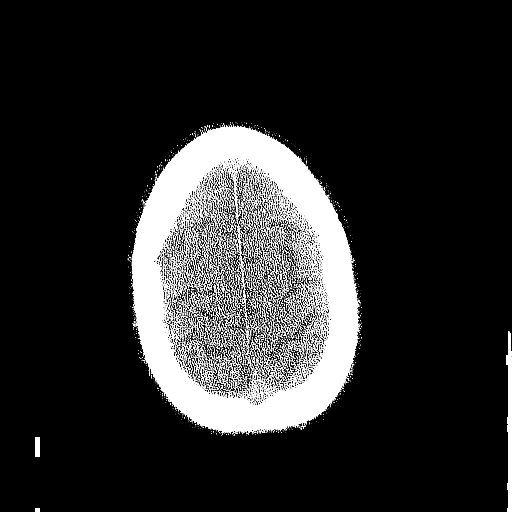
[im 76/85  brain]
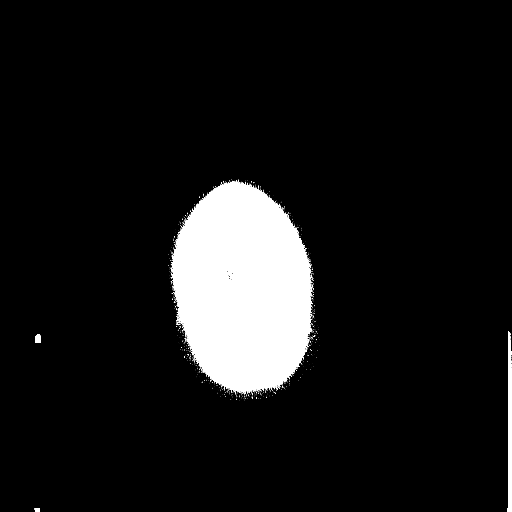

[Series 5: head 3.0 mpr cor · coronal · 0.33mm/px · 3 of 74 slices shown]
[im 25/74  brain]
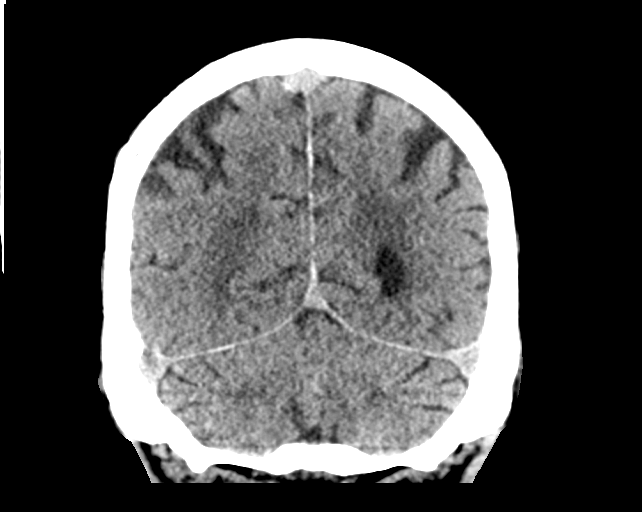
[im 33/74  brain]
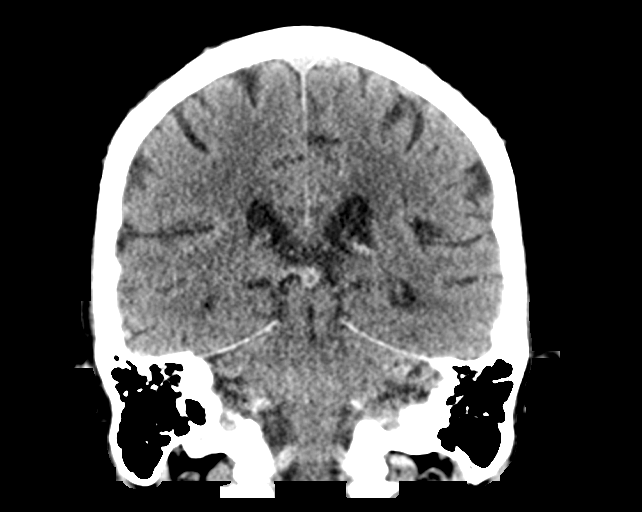
[im 41/74  brain]
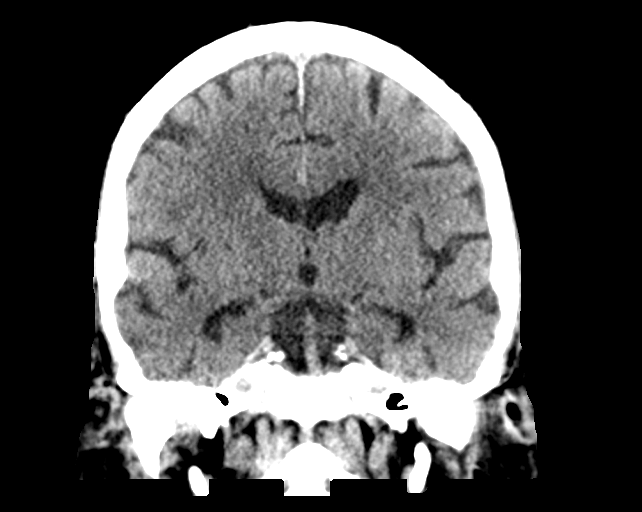

[Series 6: head 3.0 mpr sag · sagittal · 0.33mm/px · 3 of 65 slices shown]
[im 22/65  brain]
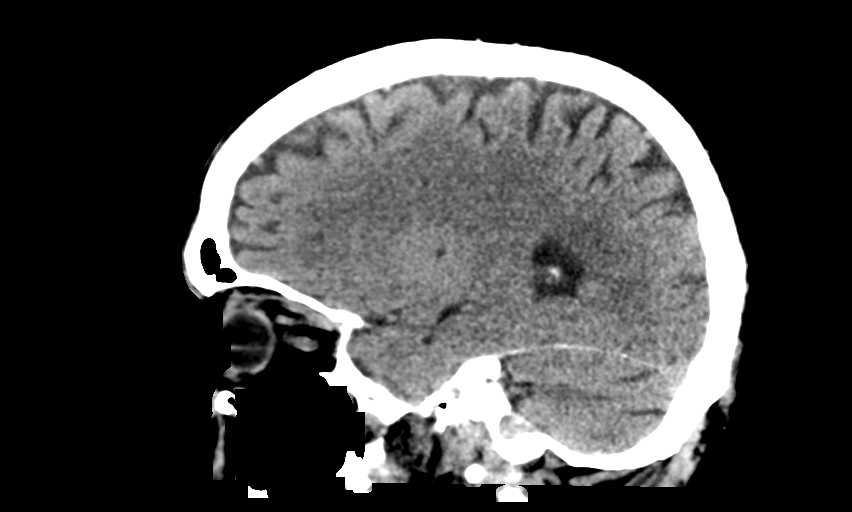
[im 33/65  brain]
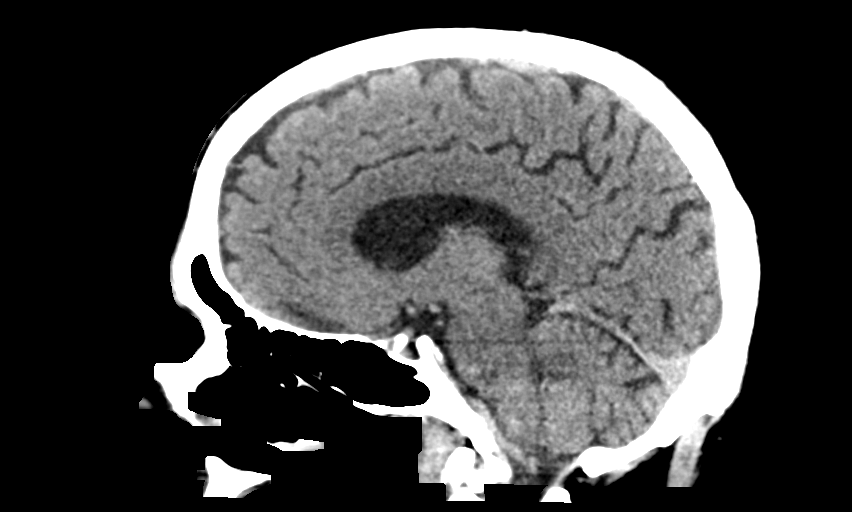
[im 43/65  brain]
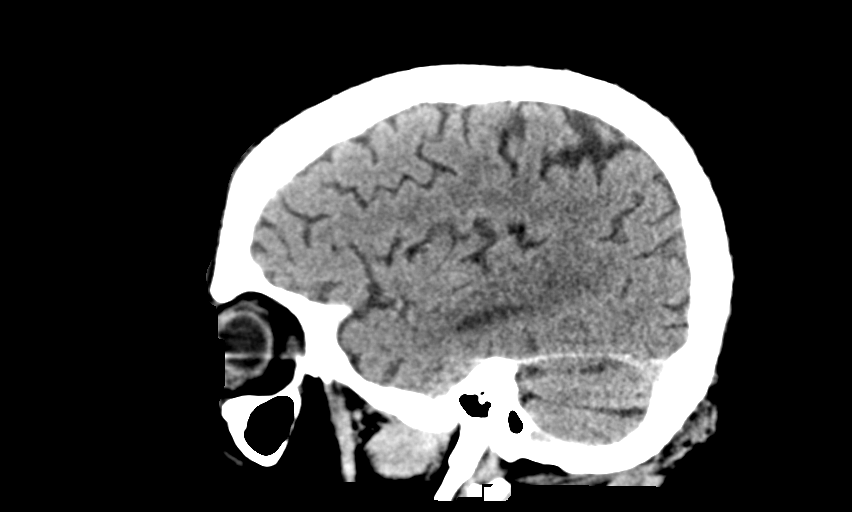

[14 of 47 positions shown; findings below may reference images not displayed]

FINDINGS: Brain: No evidence of acute infarction, hemorrhage, hydrocephalus,
extra-axial collection, or mass lesion/mass effect. Mild chronic
small vessel disease is stable in appearance.

Vascular:  No hyperdense vessel or other acute findings.

Skull: No evidence of fracture or other significant bone
abnormality.

Sinuses/Orbits:  No acute findings.

Other: None.
IMPRESSION: No acute intracranial abnormality. Stable mild chronic small vessel
disease.

## 2021-06-05 ENCOUNTER — Other Ambulatory Visit: Payer: Self-pay

## 2021-06-05 ENCOUNTER — Ambulatory Visit (HOSPITAL_COMMUNITY)
Admission: EM | Admit: 2021-06-05 | Discharge: 2021-06-05 | Disposition: A | Discharge status: Home / Self Care | Payer: Medicaid Other | Attending: Psychiatry | Admitting: Psychiatry

## 2021-06-05 DIAGNOSIS — Z8659 Personal history of other mental and behavioral disorders: Secondary | ICD-10-CM | POA: Insufficient documentation

## 2021-06-05 DIAGNOSIS — F1020 Alcohol dependence, uncomplicated: Secondary | ICD-10-CM | POA: Insufficient documentation

## 2021-06-05 DIAGNOSIS — Z9151 Personal history of suicidal behavior: Secondary | ICD-10-CM | POA: Insufficient documentation

## 2021-06-05 NOTE — Progress Notes (Signed)
   06/05/21 0838  [place] (Walk-ins at [facility] only)  How Did You Hear About [place]? Self  What Is the Reason for Your Visit/Call Today? Patient presents with daughter requesting to speak with someone about SA treatment options.  He reports last drink was 4 days ago.  He denies any withdrawal symptoms.  He has a seizure disorder and and is on medication.  Patient has a chronic alcohol use history, drinking since age 65. He was sober for 8 years, over 10 years ago.   Patient denies SI, HI and AVH.  Last treatment was 30 years ago - outpt program.  Patient prefers outpatient programs at this time.  How Long Has This Been Causing You Problems? > than 6 months  Have You Recently Had Any Thoughts About Hurting Yourself? No  Are You Planning to Commit Suicide/Harm Yourself At This time? No  Have you Recently Had Thoughts About [place]? No  Are You Planning To Harm Someone At This Time? No  Are you currently experiencing any auditory, visual or other hallucinations? No  Have You Used Any Alcohol or Drugs in the Past 24 Hours? Yes  How long ago did you use Drugs or Alcohol? Last drink was 4 days ago  What Did You Use and How Much? Alcohol - 2 (40s)  Do you have any current medical co-morbidities that require immediate attention? Yes  Please describe current medical co-morbidities that require immediate attention: No immediate attention - Seizure disorder - last seizure 4 mos ago  Clinician description of patient physical appearance/behavior: Calm, cooperative and pleasant  What Do You Feel Would Help You the Most Today? Alcohol or Drug Use Treatment  If access to [facility] was not available, would you have sought care in the Emergency Department? No  Determination of Need Routine (7 days)  Options For Referral Chemical Dependency Intensive Outpatient Therapy (CDIOP);Outpatient Therapy;Facility-Based Crisis

## 2021-06-05 NOTE — Discharge Summary (Signed)
Kennieth Francois to be D/C'd Home per MD order. Discussed with the patient and all questions fully answered. An After Visit Summary was printed and given to the patient. Patient escorted out and D/C home via private auto.  Clois Dupes  06/05/2021 10:38 AM

## 2021-06-05 NOTE — Discharge Instructions (Addendum)
Substance Abuse Resources  [facility] - Admissions are currently completed Monday through Friday at [facility]; both appointments and walk-ins are accepted.  Any individual that is a [facility] resident may present for a substance abuse screening and assessment for admission.  A person may be referred by numerous sources or self-refer.   Potential clients will be screened for medical necessity and appropriateness for the program.  Clients must meet criteria for high-intensity residential treatment services.  If clinically appropriate, a client will continue with the comprehensive clinical assessment and intake process, as well as enrollment in the [place].   Address: [address] [place], [place] [address] Admin Hours: Mon-Fri 8AM to [place] Hours: 24/7 Phone: [phone] Fax: [phone]   [facility]) Facility Based Crisis:  These are 3 locations for services: Please call before arrival    Address: [address] [place], [place] [address] Phone: [phone]   Address: [address] [place], [place] [address] Phone#: [phone]   Address: [address] [place], [place] [address] Phone#: [phone]     [facility] ([facility]): (offers outpatient therapy and intensive outpatient substance abuse therapy).  [address], [place], [place] [address] Phone: [phone]   [place]: Offers FREE recovery skills classes, support groups, 1:1 Peer Support, and Compeer Classes. [address] [place], [place] [address] Phone: [phone] (Call to complete intake).  [facility] [address] [place], [place] [address] Phone: [phone] ext: [facility] provides food, shelter and other programs and services to the homeless men of [place] through our [employer].   By offering safe shelter, three meals a day,  clean clothing, Biblical counseling, financial planning, vocational training, GED/education and employment assistance, we've helped mend the shattered lives of many homeless men since opening in 1974.   We have approximately 267 beds available, with a max of 312 beds including mats for emergency situations and currently house an average of 270 men a night.   Prospective Client Check-In Information Photo ID Required (State/ Out of State/ [facility]) - if photo ID is not available, clients are required to have a printout of a police/sheriff's criminal history report. Help out with chores around the [place]. No sex offender of any type (pending, charged, registered and/or any other sex related offenses) will be permitted to check in. Must be willing to abide by all rules, regulations, and policies established by the [employer]. The following will be provided - shelter, food, clothing, and biblical counseling. If you or someone you know is in need of assistance at our [facility] shelter in [place], [place], please call [phone] ext. [phone].   [facility]-will provide timely access to mental health services for children and adolescents (4-17) and adults presenting in a mental health crisis. The program is designed for those who need urgent Behavioral Health or Substance Use treatment and are not experiencing a medical crisis that would typically require an emergency room visit.    [place], [place] [address] Phone: [phone] [email or web address]: Phone#: [phone]   The [facility] SA Intensive Outpatient Program (SAIOP) means structured individual and group addiction activities and services that are provided at an outpatient program designed to assist adult and adolescent consumers to begin recovery and learn skills for recovery maintenance. The [place] program is offered at  least 3 hours a  day, 3 days a week.SAIOP services shall include a structured program consisting of, but not limited to, the following services: Individual counseling and support; Group counseling and support; Family counseling, training or support; Biochemical assays to identify recent drug use (e.g., urine drug screens); Strategies for relapse prevention to include community and social support systems in treatment; Life skills; Crisis contingency planning; Disease Management; and Treatment support activities that have been adapted or specifically designed for persons with physical disabilities, or persons with co-occurring disorders of mental illness and substance abuse/dependence or mental retardation/developmental disability and substance abuse/dependence. Phone: [phone]     The [place]: [phone]  [place]: [phone]

## 2021-06-05 NOTE — ED Provider Notes (Signed)
Behavioral Health Urgent Care Medical Screening Exam  Patient Name: Benjamin Peterson MRN: [id number] Date of Evaluation: 06/05/21 Chief Complaint:   Diagnosis:  Final diagnoses:  Alcohol use disorder, severe, dependence ([place])    History of Present illness: Benjamin Peterson is a 65 y.o. male with a history of alcohol use disorder and a history of bipolar disorder who presents to the [facility] voluntarily with his daughter for assistance with substance use treatment. Patient is interviewed with daughter present. Pt states that he presented today to get linked with outpatient substance use treatment; he states that he has been drinking since he was 65 years old and ready to stop. Pt reports drinking alcohol consistently since he was 65 years old apart from a period of 8 years of sobriety over 10 years ago. Pt reports that he achieved sobriety on his own at that time and did not attending rehab and cites marital conflict as the reason for relapse. He states that he was consuming as much as 10 40 oz beers a day, but then cut down to 2 40 oz beers prior to stopping ~5 days ago; he states that he has not had a drink in about 5 days. He denies withdrawal symptoms at this time. Pt reports that he was previously diagnosed with bipolar disorder but that he has not seen a psychiatrist or taken psychiatric medications since he lived in [place] over 8 years ago. He does not recall what medications he was taking at the time. No manic sx reported. He reports his mood as "ok" and expresses interest in getting assistance with alcohol cessation. He denies SI/HI/AVH. Daughter present throughout assessment and does not report safety concerns. Patient and daughter were handed list of outpatient resources for substance use treatment and patient and daughter state that they will look through the information provided prior to choosing with facility or program to utilize services from.   Past Psychiatric History: Previous Medication  Trials: yes, but unable to recall Previous Psychiatric Hospitalizations: yes, "20 years ago at least"- cites reason as "mostly alcohol" Previous Suicide Attempts: yx1 about 30 years ago where he cut his wrist History of Violence: denies recent violence but states that he got into "drug fights" in the past; most recently in the year 2000 Outpatient psychiatrist: no  Social History: Marital Status: not married Children: 1 Source of Income: disability and retirement. Pt also reports that he will cut grass Housing Status: with pastor Easy access to gun: no  Substance Use (with emphasis over the last 12 months) Recreational Drugs: alcohol as per HPI Use of Alcohol: denied Rehab History: no  Family Psychiatric History: Sister with bipolar disorder   Psychiatric Specialty Exam  Presentation  General Appearance:Appropriate for Environment; Casual Eye Contact:Good Speech:Clear and Coherent; Normal Rate Speech Volume:Normal Handedness:No data recorded  Mood and Affect  Mood: Euthymic Affect: Appropriate; Congruent  Thought Process  Thought Processes: Coherent; Goal Directed; Linear Descriptions of Associations:Intact Orientation:Full (Time, Place and Person) Thought Content:WDL   Hallucinations:None Ideas of Reference:None Suicidal Thoughts:No Homicidal Thoughts:No  Sensorium  Memory: Immediate Good; Recent Good; Remote Good Judgment: Good Insight: Good (good insight into his substance use; seeking assistance with [place])  Executive Functions  Concentration: Good Attention Span: Good Recall: Good Fund of Knowledge: Good Language: No data recorded  Psychomotor Activity  Psychomotor Activity: Normal  Assets  Assets: Communication Skills; Desire for Improvement; Housing; Catering manager; Resilience; Social Support  Sleep  Sleep: Fair Number of hours:  No data recorded  No data recorded  Physical Exam: Physical  Exam Constitutional:      Appearance: Normal appearance. He is normal weight.  HENT:     Head: Normocephalic and atraumatic.  Eyes:     Extraocular Movements: Extraocular movements intact.  Pulmonary:     Effort: Pulmonary effort is normal.  Neurological:     General: No focal deficit present.     Mental Status: He is alert and oriented to person, place, and time.  Psychiatric:        Attention and Perception: Attention and perception normal.        Speech: Speech normal.        Behavior: Behavior normal. Behavior is cooperative.        Thought Content: Thought content normal.   Review of Systems  Constitutional:  Negative for chills and fever.  Eyes:  Negative for discharge and redness.  Respiratory:  Negative for cough.   Gastrointestinal:  Negative for nausea and vomiting.  Neurological:  Negative for headaches.  Psychiatric/Behavioral:  Positive for substance abuse. Negative for depression, hallucinations and suicidal ideas.   Blood pressure 131/76, pulse 89, temperature 98.8 F (37.1 C), temperature source Oral, resp. rate 18, height 5\' 7"  (1.702 m), weight 81.6 kg, SpO2 98 %. Body mass index is 28.19 kg/m.  Musculoskeletal: Strength & Muscle Tone: within normal limits Gait & Station: normal Patient leans: N/A   [facility] MSE Discharge Disposition for Follow up and Recommendations: Based on my evaluation the patient does not appear to have an emergency medical condition and can be discharged with resources and follow up care in outpatient services for Medication Management and Substance Abuse Intensive Outpatient Program  Resource sheet for substance use treatment handed to patient and daughter and additional resources placed in AVS  Ival Bible, MD 06/05/2021, 10:07 AM

## 2021-06-06 ENCOUNTER — Ambulatory Visit: Payer: Medicaid Other | Admitting: Family

## 2021-06-12 ENCOUNTER — Other Ambulatory Visit (HOSPITAL_COMMUNITY): Payer: Self-pay

## 2021-06-17 ENCOUNTER — Other Ambulatory Visit (HOSPITAL_COMMUNITY): Payer: Self-pay

## 2021-06-20 ENCOUNTER — Ambulatory Visit: Payer: Medicaid Other | Admitting: Pharmacist

## 2021-06-23 ENCOUNTER — Emergency Department (HOSPITAL_COMMUNITY)
Admission: EM | Admit: 2021-06-23 | Discharge: 2021-06-24 | Disposition: A | Discharge status: Home / Self Care | Payer: Medicaid Other | Attending: Emergency Medicine | Admitting: Emergency Medicine

## 2021-06-23 ENCOUNTER — Other Ambulatory Visit: Payer: Self-pay

## 2021-06-23 ENCOUNTER — Emergency Department (HOSPITAL_COMMUNITY): Payer: Medicaid Other

## 2021-06-23 ENCOUNTER — Ambulatory Visit (HOSPITAL_COMMUNITY)
Admission: EM | Admit: 2021-06-23 | Discharge: 2021-06-23 | Disposition: A | Discharge status: Other Acute Inpatient Hospital | Payer: Medicaid Other | Attending: Urology | Admitting: Urology

## 2021-06-23 DIAGNOSIS — R443 Hallucinations, unspecified: Secondary | ICD-10-CM | POA: Insufficient documentation

## 2021-06-23 DIAGNOSIS — F1010 Alcohol abuse, uncomplicated: Secondary | ICD-10-CM | POA: Insufficient documentation

## 2021-06-23 DIAGNOSIS — U071 COVID-19: Secondary | ICD-10-CM | POA: Insufficient documentation

## 2021-06-23 DIAGNOSIS — F1020 Alcohol dependence, uncomplicated: Secondary | ICD-10-CM | POA: Insufficient documentation

## 2021-06-23 DIAGNOSIS — Z9181 History of falling: Secondary | ICD-10-CM | POA: Diagnosis not present

## 2021-06-23 DIAGNOSIS — R569 Unspecified convulsions: Secondary | ICD-10-CM | POA: Diagnosis not present

## 2021-06-23 DIAGNOSIS — Z046 Encounter for general psychiatric examination, requested by authority: Secondary | ICD-10-CM | POA: Insufficient documentation

## 2021-06-23 DIAGNOSIS — S0081XA Abrasion of other part of head, initial encounter: Secondary | ICD-10-CM | POA: Insufficient documentation

## 2021-06-23 DIAGNOSIS — W19XXXA Unspecified fall, initial encounter: Secondary | ICD-10-CM | POA: Insufficient documentation

## 2021-06-23 DIAGNOSIS — Z634 Disappearance and death of family member: Secondary | ICD-10-CM | POA: Insufficient documentation

## 2021-06-23 DIAGNOSIS — F1014 Alcohol abuse with alcohol-induced mood disorder: Secondary | ICD-10-CM | POA: Diagnosis not present

## 2021-06-23 DIAGNOSIS — F17210 Nicotine dependence, cigarettes, uncomplicated: Secondary | ICD-10-CM | POA: Insufficient documentation

## 2021-06-23 DIAGNOSIS — Y906 Blood alcohol level of 120-199 mg/100 ml: Secondary | ICD-10-CM | POA: Insufficient documentation

## 2021-06-23 LAB — COMPREHENSIVE METABOLIC PANEL
ALT: 25 U/L (ref 0–44)
AST: 53 U/L — ABNORMAL HIGH (ref 15–41)
Albumin: 2.6 g/dL — ABNORMAL LOW (ref 3.5–5.0)
Alkaline Phosphatase: 76 U/L (ref 38–126)
Anion gap: 8 (ref 5–15)
BUN: 7 mg/dL — ABNORMAL LOW (ref 8–23)
CO2: 25 mmol/L (ref 22–32)
Calcium: 8.4 mg/dL — ABNORMAL LOW (ref 8.9–10.3)
Chloride: 108 mmol/L (ref 98–111)
Creatinine, Ser: 0.78 mg/dL (ref 0.61–1.24)
GFR, Estimated: >60 mL/min (ref >60)
Glucose, Bld: 68 mg/dL — ABNORMAL LOW (ref 70–99)
Potassium: 3.8 mmol/L (ref 3.5–5.1)
Sodium: 141 mmol/L (ref 135–145)
Total Bilirubin: 1.7 mg/dL — ABNORMAL HIGH (ref 0.3–1.2)
Total Protein: 7.3 g/dL (ref 6.5–8.1)

## 2021-06-23 LAB — CBC WITH DIFFERENTIAL/PLATELET
Abs Immature Granulocytes: 0.01 10*3/uL (ref 0.00–0.07)
Basophils Absolute: 0 10*3/uL (ref 0.0–0.1)
Basophils Relative: 0 %
Eosinophils Absolute: 0.1 10*3/uL (ref 0.0–0.5)
Eosinophils Relative: 3 %
HCT: 38.5 % — ABNORMAL LOW (ref 39.0–52.0)
Hemoglobin: 12.8 g/dL — ABNORMAL LOW (ref 13.0–17.0)
Immature Granulocytes: 0 %
Lymphocytes Relative: 52 %
Lymphs Abs: 2.1 10*3/uL (ref 0.7–4.0)
MCH: 33.2 pg (ref 26.0–34.0)
MCHC: 33.2 g/dL (ref 30.0–36.0)
MCV: 99.7 fL (ref 80.0–100.0)
Monocytes Absolute: 0.4 10*3/uL (ref 0.1–1.0)
Monocytes Relative: 9 %
Neutro Abs: 1.5 10*3/uL — ABNORMAL LOW (ref 1.7–7.7)
Neutrophils Relative %: 36 %
Platelets: 49 10*3/uL — ABNORMAL LOW (ref 150–400)
RBC: 3.86 MIL/uL — ABNORMAL LOW (ref 4.22–5.81)
RDW: 14.6 % (ref 11.5–15.5)
WBC: 4.1 10*3/uL (ref 4.0–10.5)
nRBC: 0 % (ref 0.0–0.2)

## 2021-06-23 LAB — AMMONIA: Ammonia: 52 umol/L — ABNORMAL HIGH (ref 9–35)

## 2021-06-23 NOTE — BH Assessment (Signed)
Disregard note.
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
## 2021-06-23 NOTE — ED Notes (Signed)
Patient transported to [facility] ED for medical clearance via [employer]. Patient aware of transport and going to [facility] ED voices no concerns. Patient escorted to meet [employer] by staff.
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
## 2021-06-24 ENCOUNTER — Other Ambulatory Visit (HOSPITAL_COMMUNITY): Payer: Self-pay

## 2021-06-24 ENCOUNTER — Encounter (HOSPITAL_COMMUNITY): Payer: Self-pay | Admitting: Registered Nurse

## 2021-06-24 ENCOUNTER — Ambulatory Visit: Payer: Medicaid Other | Admitting: Pharmacist

## 2021-06-24 DIAGNOSIS — F1014 Alcohol abuse with alcohol-induced mood disorder: Secondary | ICD-10-CM

## 2021-06-24 DIAGNOSIS — F1020 Alcohol dependence, uncomplicated: Secondary | ICD-10-CM | POA: Diagnosis present

## 2021-06-24 DIAGNOSIS — F1010 Alcohol abuse, uncomplicated: Secondary | ICD-10-CM

## 2021-06-24 LAB — RAPID URINE DRUG SCREEN, HOSP PERFORMED
Amphetamines: NOT DETECTED
Barbiturates: NOT DETECTED
Benzodiazepines: NOT DETECTED
Cocaine: NOT DETECTED
Opiates: NOT DETECTED
Tetrahydrocannabinol: NOT DETECTED

## 2021-06-24 LAB — RESP PANEL BY RT-PCR (FLU A&B, COVID) ARPGX2
Influenza A by PCR: NEGATIVE
Influenza B by PCR: NEGATIVE
SARS Coronavirus 2 by RT PCR: POSITIVE — AB

## 2021-06-24 LAB — ACETAMINOPHEN LEVEL: Acetaminophen (Tylenol), Serum: <10 ug/mL — ABNORMAL LOW (ref 10–30)

## 2021-06-24 LAB — SALICYLATE LEVEL: Salicylate Lvl: <7 mg/dL — ABNORMAL LOW (ref 7.0–30.0)

## 2021-06-24 LAB — ETHANOL: Alcohol, Ethyl (B): 152 mg/dL — ABNORMAL HIGH (ref <10)

## 2021-06-24 MED ORDER — LORAZEPAM 2 MG/ML IJ SOLN: 0.0000 mg | Freq: Two times a day (BID) | INTRAMUSCULAR | Status: DC

## 2021-06-24 MED ORDER — LORAZEPAM 1 MG PO TABS: 0.0000 mg | ORAL_TABLET | Freq: Two times a day (BID) | ORAL | Status: DC

## 2021-06-24 MED ORDER — LORAZEPAM 2 MG/ML IJ SOLN: 0.0000 mg | Freq: Four times a day (QID) | INTRAMUSCULAR | Status: DC

## 2021-06-24 MED ORDER — THIAMINE HCL 100 MG PO TABS
100.0000 mg | ORAL_TABLET | Freq: Every day | ORAL | Status: DC
  Administered 2021-06-24: 100 mg via ORAL
  Filled 2021-06-24: qty 1

## 2021-06-24 MED ORDER — THIAMINE HCL 100 MG/ML IJ SOLN: 100.0000 mg | Freq: Every day | INTRAMUSCULAR | Status: DC

## 2021-06-24 MED ORDER — LEVETIRACETAM 500 MG PO TABS
500.0000 mg | ORAL_TABLET | Freq: Two times a day (BID) | ORAL | Status: DC
  Administered 2021-06-24: 500 mg via ORAL
  Filled 2021-06-24: qty 1

## 2021-06-24 MED ORDER — SOFOSBUVIR-VELPATASVIR 400-100 MG PO TABS: 1.0000 | ORAL_TABLET | Freq: Every day | ORAL | Status: DC

## 2021-06-24 MED ORDER — LORAZEPAM 1 MG PO TABS
0.0000 mg | ORAL_TABLET | Freq: Four times a day (QID) | ORAL | Status: DC
Start: 2021-06-24 — End: 2021-06-24

## 2021-06-24 NOTE — ED Notes (Signed)
Placed Breakfast Order 

## 2021-06-24 NOTE — ED Provider Notes (Signed)
12:14 PM For behavioral health patient appropriate for discharge.  He is in no distress, sleeping, seemingly comfortably.   Carmin Muskrat, MD 06/24/21 1214

## 2021-06-24 NOTE — Consult Note (Signed)
Telepsych Consultation   Reason for Consult:  IVC and violent, aggressive behavior Referring Physician:  Charlann Lange, PA-C Location of Patient: [facility] ED Location of Provider: Other: [facility]  Patient Identification: Benjamin Peterson MRN:  [id number] Principal Diagnosis: Alcohol use disorder, moderate, dependence ([place]) Diagnosis:  Principal Problem:   Alcohol use disorder, severe, dependence ([place]) Active Problems:   Alcohol abuse with alcohol-induced mood disorder ([place])   Total Time spent with patient: 30 minutes  Subjective:   Benjamin Peterson is a 65 y.o. male patient with history of alcohol abuse, seizure, and hepatitis C admitted to [facility] ED after sent from [facility] where patient initially presented via law enforcement under IVC by his pastor/friend who is patients' payee and patient is living with.  Per IVC: "Respondent is no longer taking his medication and has become violent and aggressive. Respondent has made comments towards his wife is deceased and respondent is not able to care for himself and has left his group home and has been found wondering. Respondent suffers from alcoholism, seizures and Hep C. Petitioner is concerned that the respondent will be found dead due to inability to care for himself."   HPI:  [name], 65 y.o., male patient seen via tele health by this provider, consulted with Dr. Ernie Hew; and chart reviewed on 06/24/21.  On evaluation [name] reports he is not really sure why he was brought to the hospital.  Patient denies suicidal/self-harm/homicidal ideation.  Patient states he is living with his pastor.  Patient states he does drink beer daily but amount varies.  Patient denies any problems with withdrawal when not drinking.  Patient states he is not interested in going to a rehab program but is willing to go to outpatient services that provides assist with substance use.  Patient gave permission to speak with Merwyn Katos his pastor  During evaluation  [name] is elevated up in bed in no acute distress.  He is alert, oriented x 4, calm and cooperative.  His mood is euthymic with congruent affect.  He does not appear to be responding to internal/external stimuli or delusional thoughts.  Patient denies suicidal/self-harm/homicidal ideation, psychosis, and paranoia.  Patient answered question appropriately.   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 No pertinent surgical history. Family History: History reviewed. No pertinent family history. Family Psychiatric  History: None noted Social History:  Social History   Substance and Sexual Activity  Alcohol Use Yes   Comment: 2.5 gallons of liquor     Social History   Substance and Sexual Activity  Drug Use No    Social History   Socioeconomic History   Marital status: Married    Spouse name: Not on file   Number of children: Not on file   Years of education: Not on file  Highest education level: Not on file  Occupational History   Not on file  Tobacco Use   Smoking status: Heavy Smoker    Packs/day: 1.00    Types: Cigarettes   Smokeless tobacco: Never  Vaping Use   Vaping Use: Never used  Substance and Sexual Activity   Alcohol use: Yes    Comment: 2.5 gallons of liquor   Drug use: No   Sexual activity: Not on file  Other Topics Concern   Not  on file  Social History Narrative   Not on file   Social Determinants of Health   Financial Resource Strain: Not on file  Food Insecurity: Not on file  Transportation Needs: Not on file  Physical Activity: Not on file  Stress: Not on file  Social Connections: Not on file   Additional Social History:    Allergies:  No Known Allergies  Labs:  Results for orders placed or performed during the hospital encounter of 06/23/21 (from the past 48 hour(s))  Urine rapid drug screen (hosp performed)     Status: None   Collection Time: 06/23/21 10:21 PM  Result Value Ref Range   Opiates NONE DETECTED NONE DETECTED   Cocaine NONE DETECTED NONE DETECTED   Benzodiazepines NONE DETECTED NONE DETECTED   Amphetamines NONE DETECTED NONE DETECTED   Tetrahydrocannabinol NONE DETECTED NONE DETECTED   Barbiturates NONE DETECTED NONE DETECTED    Comment: (NOTE) DRUG SCREEN FOR MEDICAL PURPOSES ONLY.  IF CONFIRMATION IS NEEDED FOR ANY PURPOSE, NOTIFY LAB WITHIN 5 DAYS.  LOWEST DETECTABLE LIMITS FOR URINE DRUG SCREEN Drug Class                     Cutoff (ng/mL) Amphetamine and metabolites    1000 Barbiturate and metabolites    200 Benzodiazepine                 [id number] Tricyclics and metabolites     300 Opiates and metabolites        300 Cocaine and metabolites        300 THC                            50 Performed at [facility], [place] [address] [place], [place] [address]   Comprehensive metabolic panel     Status: Abnormal   Collection Time: 06/23/21 10:26 PM  Result Value Ref Range   Sodium 141 135 - 145 mmol/L   Potassium 3.8 3.5 - 5.1 mmol/L   Chloride 108 98 - 111 mmol/L   CO2 25 22 - 32 mmol/L   Glucose, Bld 68 (L) 70 - 99 mg/dL    Comment: Glucose reference range applies only to samples taken after fasting for at least 8 hours.   BUN 7 (L) 8 - 23 mg/dL   Creatinine, Ser 0.78 0.61 - 1.24 mg/dL   Calcium 8.4 (L) 8.9 - 10.3 mg/dL   Total Protein 7.3 6.5 - 8.1 g/dL   Albumin  2.6 (L) 3.5 - 5.0 g/dL   AST 53 (H) 15 - 41 U/L   ALT 25 0 - 44 U/L   Alkaline Phosphatase 76 38 - 126 U/L   Total Bilirubin 1.7 (H) 0.3 - 1.2 mg/dL   GFR, Estimated >60 >60 mL/min    Comment: (NOTE) Calculated using the CKD-EPI Creatinine Equation (2021)    Anion gap 8 5 - 15    Comment: Performed at [facility], [place] [address], [place], [place] [address]  Ethanol     Status: Abnormal   Collection Time: 06/23/21 10:26 PM  Result Value Ref Range   Alcohol, Ethyl (B) 152 (H) <10 mg/dL    Comment: (NOTE) Lowest detectable limit for serum alcohol is 10 mg/dL.  For medical purposes only. Performed at [facility], [place] [address], [place], [place] [address]   CBC with Diff     Status: Abnormal   Collection Time: 06/23/21 10:26 PM  Result Value Ref Range   WBC 4.1 4.0 - 10.5 K/uL   RBC 3.86 (L) 4.22 - 5.81 MIL/uL   Hemoglobin 12.8 (L) 13.0 - 17.0 g/dL   HCT 38.5 (L) 39.0 - 52.0 %   MCV 99.7 80.0 - 100.0 fL   MCH 33.2 26.0 - 34.0 pg   MCHC 33.2 30.0 - 36.0 g/dL   RDW 14.6 11.5 - 15.5 %   Platelets 49 (L) 150 - 400 K/uL    Comment: Immature Platelet Fraction may be clinically indicated, consider ordering this additional test [id number] REPEATED TO VERIFY PLATELET COUNT CONFIRMED BY SMEAR    nRBC 0.0 0.0 - 0.2 %   Neutrophils Relative % 36 %   Neutro Abs 1.5 (L) 1.7 - 7.7 K/uL   Lymphocytes Relative 52 %   Lymphs Abs 2.1 0.7 - 4.0 K/uL   Monocytes Relative 9 %   Monocytes Absolute 0.4 0.1 - 1.0 K/uL   Eosinophils Relative 3 %   Eosinophils Absolute 0.1 0.0 - 0.5 K/uL   Basophils Relative 0 %   Basophils Absolute 0.0 0.0 - 0.1 K/uL   WBC Morphology MORPHOLOGY UNREMARKABLE    RBC Morphology MORPHOLOGY UNREMARKABLE    Smear Review PLATELET COUNT CONFIRMED BY SMEAR    Immature Granulocytes 0 %   Abs Immature Granulocytes 0.01 0.00 - 0.07 K/uL    Comment: Performed at [facility], [place] [address] [place], [place] [id number]  Salicylate level     Status:  Abnormal   Collection Time: 06/23/21 10:26 PM  Result Value Ref Range   Salicylate Lvl [id number] (L) 7.0 - 30.0 mg/dL    Comment: Performed at [place] [address] [place], [place] [address]  Acetaminophen level     Status: Abnormal   Collection Time: 06/23/21 10:26 PM  Result Value Ref Range   Acetaminophen (Tylenol), Serum <10 (L) 10 - 30 ug/mL    Comment: (NOTE) Therapeutic concentrations vary significantly. A range of 10-30 ug/mL  may be an effective concentration for many patients. However, some  are best treated at concentrations outside of this range. Acetaminophen concentrations >150 ug/mL at 4 hours after ingestion  and >50 ug/mL at 12 hours after ingestion are often associated with  toxic reactions.  Performed at [facility], [place] [address] [place], [place] [address]   Ammonia     Status: Abnormal   Collection Time: 06/23/21 10:26 PM  Result Value Ref Range   Ammonia 52 (H) 9 - 35 umol/L    Comment: Performed at [facility], [place] [address], [place], [place] [address]  Resp Panel by RT-PCR (Flu A&B, Covid) Nasopharyngeal Swab     Status: Abnormal   Collection Time: 06/24/21  7:35 AM   Specimen: Nasopharyngeal Swab; Nasopharyngeal(NP) swabs in vial transport medium  Result Value Ref Range   SARS Coronavirus 2 by RT PCR POSITIVE (A) NEGATIVE    Comment: RESULT CALLED TO, READ BACK  BY AND VERIFIED WITH: RN APRIL OAKLEY 06/24/21'@8'$ :39 BY TW (NOTE) SARS-CoV-2 target nucleic acids are DETECTED.  The SARS-CoV-2 RNA is generally detectable in upper respiratory specimens during the acute phase of infection. Positive results are indicative of the presence of the identified virus, but do not rule out bacterial infection or co-infection with other pathogens not detected by the test. Clinical correlation with patient history and other diagnostic information is necessary to determine patient infection status. The expected result is Negative.  Fact Sheet for  Patients: EntrepreneurPulse.com.au  Fact Sheet for Healthcare Providers: IncredibleEmployment.be  This test is not yet approved or cleared by the [place] FDA and  has been authorized for detection and/or diagnosis of SARS-CoV-2 by FDA under an Emergency Use Authorization (EUA).  This EUA will remain in effect (meaning this test can be  used) for the duration of  the COVID-19 declaration under Section 564(b)(1) of the Act, 21 U.S.C. section 360bbb-3(b)(1), unless the authorization is terminated or revoked sooner.     Influenza A by PCR NEGATIVE NEGATIVE   Influenza B by PCR NEGATIVE NEGATIVE    Comment: (NOTE) The Xpert Xpress SARS-CoV-2/FLU/RSV plus assay is intended as an aid in the diagnosis of influenza from Nasopharyngeal swab specimens and should not be used as a sole basis for treatment. Nasal washings and aspirates are unacceptable for Xpert Xpress SARS-CoV-2/FLU/RSV testing.  Fact Sheet for Patients: EntrepreneurPulse.com.au  Fact Sheet for Healthcare Providers: IncredibleEmployment.be  This test is not yet approved or cleared by the [place] FDA and has been authorized for detection and/or diagnosis of SARS-CoV-2 by FDA under an Emergency Use Authorization (EUA). This EUA will remain in effect (meaning this test can be used) for the duration of the COVID-19 declaration under Section 564(b)(1) of the Act, 21 U.S.C. section 360bbb-3(b)(1), unless the authorization is terminated or revoked.  Performed at [facility], [place] [address] [place], [place] [address]     Medications:  Current Facility-Administered Medications  Medication Dose Route Frequency Provider Last Rate Last Admin   levETIRAcetam (KEPPRA) tablet 500 mg  500 mg Oral BID Charlann Lange, PA-C   500 mg at 06/24/21 1121   LORazepam (ATIVAN) injection 0-4 mg  0-4 mg Intravenous Q6H Upstill, Shari, PA-C       Or    LORazepam (ATIVAN) tablet 0-4 mg  0-4 mg Oral Q6H Upstill, Shari, PA-C       [START ON 06/26/2021] LORazepam (ATIVAN) injection 0-4 mg  0-4 mg Intravenous Q12H Upstill, Shari, PA-C       Or   [START ON 06/26/2021] LORazepam (ATIVAN) tablet 0-4 mg  0-4 mg Oral Q12H Upstill, Shari, PA-C       Sofosbuvir-Velpatasvir 400-100 MG TABS 1 tablet  1 tablet Oral Daily Upstill, Shari, PA-C       thiamine tablet 100 mg  100 mg Oral Daily Upstill, Shari, PA-C   100 mg at 06/24/21 1121   Or   thiamine (B-1) injection 100 mg  100 mg Intravenous Daily Charlann Lange, PA-C       Current Outpatient Medications  Medication Sig Dispense Refill   acetaminophen (TYLENOL) 500 MG tablet Take 500 mg by mouth daily as needed for mild pain or headache.     Elastic Bandages & Supports (JOBST KNEE HIGH COMPRESSION SM) MISC For left leg swelling     levETIRAcetam (KEPPRA) 500 MG tablet Take 1 tablet (500 mg total) by mouth 2 (two) times daily. 60 tablet 3   Sofosbuvir-Velpatasvir (EPCLUSA) 400-100  MG TABS Take 1 tablet by mouth daily. 28 tablet 5    Musculoskeletal: Strength & Muscle Tone: within normal limits Gait & Station: normal Patient leans: N/A    Psychiatric Specialty Exam:  Presentation  General Appearance: Appropriate for Environment  Eye Contact:Good  Speech:Clear and Coherent; Normal Rate  Speech Volume:Normal  Handedness:Right   Mood and Affect  Mood:Euthymic  Affect:Appropriate; Congruent   Thought Process  Thought Processes:Coherent; Goal Directed  Descriptions of Associations:Intact  Orientation:Full (Time, Place and Person)  Thought Content:WDL  History of Schizophrenia/Schizoaffective disorder:No data recorded Duration of Psychotic Symptoms:No data recorded Hallucinations:Hallucinations: None  Ideas of Reference:None  Suicidal Thoughts:Suicidal Thoughts: No  Homicidal Thoughts:Homicidal Thoughts: No   Sensorium  Memory:Immediate Good; Recent Good; Remote  Good  Judgment:Intact  Insight:Fair; Present   Executive Functions  Concentration:Good  Attention Span:Good  [place] of Knowledge:Good  Language:Good   Psychomotor Activity  Psychomotor Activity:Psychomotor Activity: Normal   Assets  Assets:Desire for Improvement; Financial Resources/Insurance; Resilience; Social Support   Sleep  Sleep:Sleep: Good Number of Hours of Sleep: 5    Physical Exam: Physical Exam Vitals and nursing note reviewed. Exam conducted with a chaperone present.  Constitutional:      General: He is not in acute distress.    Appearance: Normal appearance. He is not ill-appearing.  Cardiovascular:     Rate and Rhythm: Normal rate.     Comments: Elevated blood pressure  Pulmonary:     Effort: Pulmonary effort is normal.  Neurological:     Mental Status: He is alert and oriented to person, place, and time.  Psychiatric:        Attention and Perception: Attention and perception normal. He does not perceive auditory or visual hallucinations.        Mood and Affect: Mood and affect normal.        Speech: Speech normal.        Behavior: Behavior normal. Behavior is cooperative.        Thought Content: Thought content normal. Thought content is not paranoid or delusional. Thought content does not include homicidal or suicidal ideation.        Cognition and Memory: Cognition and memory normal.        Judgment: Judgment normal.   Review of Systems  Constitutional: Negative.   HENT: Negative.    Eyes: Negative.   Respiratory: Negative.    Cardiovascular: Negative.   Gastrointestinal: Negative.   Genitourinary: Negative.   Musculoskeletal:  Positive for joint pain.  Skin: Negative.   Neurological: Negative.   Endo/Heme/Allergies: Negative.   Psychiatric/Behavioral:  Depression: Stable. Hallucinations: Denies. Memory loss: Denies. Substance abuse: Alcohol. Suicidal ideas: Denies. Nervous/anxious: Stable. Insomnia: Denies.         Patient stating he is not interested in Rehabilitation services but is willing to go to outpatient services for substance use and psychiatry  Blood pressure (!) 158/95, pulse 80, temperature 98.8 F (37.1 C), temperature source Oral, resp. rate 18, SpO2 97 %. There is no height or weight on file to calculate BMI.   After thorough evaluation and review of information currently presented on assessment of Matti Kepp, there is insufficient findings to indicate patient meets criteria for involuntary commitment or require an inpatient level of care. Bandy Yacono is alert/oriented x4, organized; mood congruent with affect; and he denies suicidal/self-harm/homicidal ideation, psychosis, and paranoia.  At this time He is not significantly impaired, psychotic, or manic on exam.  At this time patient is educated and verbalizes understanding  of mental health resources and other crisis services in the community. He is instructed to call 911 and present to the nearest emergency room should he experience any suicidal/homicidal ideation, auditory/visual/hallucinations, or detrimental worsening of his mental health condition. Writer also advised the patient to call the toll free phone on insurance card to assist with identifying in network counselors and agencies  Treatment Plan Summary: Plan Psychiatrically.  Follow up with resources given for community and substance use services  Disposition: No evidence of imminent risk to self or others at present.   Patient does not meet criteria for psychiatric inpatient admission. Supportive therapy provided about ongoing stressors. Refer to IOP. Discussed crisis plan, support from social network, calling 911, coming to the Emergency Department, and calling Suicide Hotline.  This service was provided via telemedicine using a 2-way, interactive audio and video technology.  Names of all persons participating in this telemedicine service and their role in this  encounter. Name: Earleen Newport, NP Role: NP  Name: Dr. Ernie Hew Role: Psychiatrist  Name: Kennieth Francois Role: Patient  Name: April Oakley, RN Role: Patient's nurse sent a secure message informing:  Psychiatric consult completed.  Patient psychiatrically cleared.  Patient interested in outpatient substance use services.  Resources will be added to AVS by Artist.  Please inform MD only default listed.    Rayshell Goecke, NP 06/24/2021 11:53 AM

## 2021-06-24 NOTE — ED Provider Notes (Signed)
[facility] EMERGENCY DEPARTMENT Provider Note   CSN: [id number] Arrival date & time: 06/23/21  2218     History No chief complaint on file.   Benjamin Peterson is a 65 y.o. male.  Patient to ED from [facility] with [employer] under IVC that was initiated by the patient's pastor citing aggressive behavior, hallucinations where he is speaking to his deceased wife, not taking his regular medications. He is reported to have fallen today without further detail known. He is also on seizure medication and is reported to have missed doses for the last several days. The patient denied SI/HI on arrival. He is not contributing to history on my evaluation.        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   Hepatitis C    History of liver cancer    Hx of traumatic brain injury 04/26/2021   Hyperammonemia (HCC)    Hx of chronic treatment with Lactulose daily   Schizophrenia ([place])    Seizures ([place])    Tobacco dependence 04/26/2021    Patient Active Problem List   Diagnosis Date Noted   Chronic hepatitis C without hepatic coma ([place]) 05/07/2021   Cirrhosis of liver ([place]) 04/26/2021   Hx of traumatic brain injury 04/26/2021   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)     No past surgical history on file.     No family history on file.  Social History   Tobacco Use   Smoking status: Heavy Smoker    Packs/day: 1.00    Types: Cigarettes   Smokeless tobacco: Never  Vaping Use   Vaping Use: Never used  Substance Use Topics   Alcohol use: Yes    Comment: 2.5 gallons of liquor   Drug use: No    Home  Medications Prior to Admission medications   Medication Sig Start Date End Date Taking? Authorizing Provider  acetaminophen (TYLENOL) 500 MG tablet Take by mouth. 04/20/20   [provider]  Elastic Bandages & Supports (JOBST KNEE HIGH COMPRESSION SM) MISC For left leg swelling 04/27/21   Maness, Arnette Norris, MD  levETIRAcetam (KEPPRA) 500 MG tablet Take 1 tablet (500 mg total) by mouth 2 (two) times daily. 04/26/21   Maness, Philip, MD  Sofosbuvir-Velpatasvir (EPCLUSA) 400-100 MG TABS Take 1 tablet by mouth daily. 05/15/21   Gold[place] Circle, FNP    Allergies    Patient has no known allergies.  Review of Systems   Review of Systems  Unable to perform ROS: Other (Patient not participating)   Physical Exam Updated Vital Signs BP (!) 159/85 (BP Location: Left Arm)   Pulse 74   Temp 98.6 F (37 C) (Oral)   Resp 20   SpO2 97%   Physical Exam Vitals and nursing note reviewed.  Cardiovascular:     Rate and Rhythm: Normal rate and regular rhythm.  Pulmonary:     Effort: Pulmonary effort is normal.     Breath sounds: No wheezing, rhonchi or rales.  Abdominal:     General: There is no distension.     Palpations: Abdomen is soft.  Musculoskeletal:  General: Normal range of motion.  Skin:    General: Skin is warm and dry.    ED Results / Procedures / Treatments   Labs (all labs ordered are listed, but only abnormal results are displayed) Labs Reviewed  COMPREHENSIVE METABOLIC PANEL - Abnormal; Notable for the following components:      Result Value   Glucose, Bld 68 (*)    BUN 7 (*)    Calcium 8.4 (*)    Albumin 2.6 (*)    AST 53 (*)    Total Bilirubin 1.7 (*)    All other components within normal limits  ETHANOL - Abnormal; Notable for the following components:   Alcohol, Ethyl (B) 152 (*)    All other components within normal limits  CBC WITH DIFFERENTIAL/PLATELET - Abnormal; Notable for the following components:   RBC 3.86 (*)    Hemoglobin 12.8 (*)    HCT  38.5 (*)    Platelets 49 (*)    Neutro Abs 1.5 (*)    All other components within normal limits  SALICYLATE LEVEL - Abnormal; Notable for the following components:   Salicylate Lvl [id number] (*)    All other components within normal limits  ACETAMINOPHEN LEVEL - Abnormal; Notable for the following components:   Acetaminophen (Tylenol), Serum <10 (*)    All other components within normal limits  AMMONIA - Abnormal; Notable for the following components:   Ammonia 52 (*)    All other components within normal limits  RESP PANEL BY RT-PCR (FLU A&B, COVID) [id number]  RAPID URINE DRUG SCREEN, HOSP PERFORMED    EKG None  Radiology CT HEAD WO CONTRAST (5MM)  Result Date: 06/23/2021 CLINICAL DATA:  Head trauma, mod-severe EXAM: CT HEAD WITHOUT CONTRAST TECHNIQUE: Contiguous axial images were obtained from the base of the skull through the vertex without intravenous contrast. COMPARISON:  05/07/2020 FINDINGS: Brain: There is atrophy and chronic small vessel disease changes. No acute intracranial abnormality. Specifically, no hemorrhage, hydrocephalus, mass lesion, acute infarction, or significant intracranial injury. Vascular: No hyperdense vessel or unexpected calcification. Skull: No acute calvarial abnormality. Sinuses/Orbits: No acute findings Other: None IMPRESSION: Atrophy, chronic microvascular disease. No acute intracranial abnormality. Electronically Signed   By: Rolm Baptise M.D.   On: 06/23/2021 23:15   CT Cervical Spine Wo Contrast  Result Date: 06/23/2021 CLINICAL DATA:  Neck trauma, intoxicated or obtunded (Age >= 16y) EXAM: CT CERVICAL SPINE WITHOUT CONTRAST TECHNIQUE: Multidetector CT imaging of the cervical spine was performed without intravenous contrast. Multiplanar CT image reconstructions were also generated. COMPARISON:  05/07/2020 FINDINGS: Alignment: No subluxation Skull base and vertebrae: No acute fracture. No primary bone lesion or focal pathologic process. Soft tissues and spinal  canal: No prevertebral fluid or swelling. No visible canal hematoma. Disc levels: Large flowing osteophytes anteriorly throughout the cervical spine. Moderate to advanced degenerative facet disease bilaterally. Upper chest: No acute findings Other: None IMPRESSION: Degenerative disc and facet disease. No acute bony abnormality. Electronically Signed   By: Rolm Baptise M.D.   On: 06/23/2021 23:17    Procedures Procedures   Medications Ordered in ED Medications  LORazepam (ATIVAN) injection 0-4 mg (has no administration in time range)    Or  LORazepam (ATIVAN) tablet 0-4 mg (has no administration in time range)  LORazepam (ATIVAN) injection 0-4 mg (has no administration in time range)    Or  LORazepam (ATIVAN) tablet 0-4 mg (has no administration in time range)  thiamine tablet 100 mg (has no administration in time range)  Or  thiamine (B-1) injection 100 mg (has no administration in time range)    ED Course  I have reviewed the triage vital signs and the nursing notes.  Pertinent labs & imaging results that were available during my care of the patient were reviewed by me and considered in my medical decision making (see chart for details).    MDM Rules/Calculators/A&P                           Patient to ED under IVC citing aggressive behavior and hallucinating. History of reported recent fall. Here from [facility] for evaluation of injuries.   Labs are nonconcerning. Glucose is 68, will encourage the patient to eat something. CT's head and neck unremarkable. RVP pending.   TTS evaluation pending to direct appropriate disposition.    Final Clinical Impression(s) / ED Diagnoses Final diagnoses:  None   IVC Recent fall  Rx / DC Orders ED Discharge Orders     None        Charlann Lange, PA-C [id number]    Delora Fuel, MD [id number] [phone]

## 2021-06-24 NOTE — ED Notes (Signed)
IVC paperwork rescinded by Dr. Vanita Panda, faxed to clerk of courts by secretary

## 2021-06-24 NOTE — ED Notes (Signed)
Sleeping  Since he arrived back in the treatment area

## 2021-06-24 NOTE — BH Assessment (Signed)
Comprehensive Clinical Assessment (CCA) Note  06/24/2021 Benjamin Peterson [id number]  Disposition: Leandro Reasoner, NP recommends continuous observation and reassessment by psychiatry, pt to be transferred to [facility]   [place] ED from 06/23/2021 in [facility] from 04/16/2019 in [facility] DEPT  C-SSRS RISK CATEGORY No Risk High Risk      The patient demonstrates the following risk factors for suicide: Chronic risk factors for suicide include: psychiatric disorder of Alcohol use Disorder and substance use disorder. Acute risk factors for suicide include: N/A. Protective factors for this patient include: positive social support. Considering these factors, the overall suicide risk at this point appears to be no risk. Patient is not appropriate for outpatient follow up.  Benjamin Peterson is a 65 year old male who presents involuntary and accompanied by IVC petitioner Jonette Eva, [phone]). Clinician asked the pt, "what brought you to the hospital?" Pt' alcohol abuse is worsening. Pt reports, he drank less because he ran out of money. Pt denies, needing and/or wanting help for his alcohol use. Pt reports, he'll drink until the day he dies. Pt reported, "I love drinking no road to stop now." Petitioner, is concerned with pt's drinking, pt drinks while taking seizure medication. Per petitioner, the pt is alcohol use has cause deterioration of  personal relationships but the pt is unable to see the shift when he's drinking. Per petitioner, the pt has not taken his seizure medication in four days, pt fell on his face on someone's porch yesterday (06/23/2021) morning at 0300. Per petitioner, when the drinks he become very anxious, depressed; pt's wife died 2020/04/27. Pt reports, his mind runs 100 sometimes. Pt reported, he is moving to [place] pt has no supports in [place]. Pt denies, SI, HI, AVH, self-injurious behaviors and access to weapons.    Per IVC paperwork: "Respondent is no longer taking his medication and has become violent and aggressive. Respondent has made comments towards his wife is deceased and respondent is not able to care for himself and has left his group home and has been found wondering. Respondent suffers from alcoholism, seizures and Hep C. Petitioner is concerned that the respondent will be found dead due to inability to care for himself."   Pt reports, today drinking half a quart of beer because he ran out of money. Pt's BAL was 152 at 2226. Pt's denies, substance use. Pt's UDS is negative. Pt denies being linked to OPT resources (medication management and/or counseling.) Pt reports, he's been in multiple treatment facilities.   Pt presents disheveled with normal speech. Pt's mood was euthymic. Pt's affect was appropriate. Pt's insight was fair. Pt's judgement was impaired. Pt reports, he can contract for safety. Petitioner has safety concerns. Pt lives on the petitioners property, received food and amenities. Petitioner is the pt's payee with his consent but wants control over his money.    Diagnosis: Alcohol use Disorder, severe.   Chief Complaint: No chief complaint on file.  Visit Diagnosis:     CCA Screening, Triage and Referral (STR)  Patient Reported Information How did you hear about [place]? Legal System  What Is the Reason for Your Visit/Call Today? IVC  How Long Has This Been Causing You Problems? <Week  What Do You Feel Would Help You the Most Today? Treatment for Depression or other mood problem   Have You Recently Had Any Thoughts About Hurting Yourself? No  Are You Planning to Commit Suicide/Harm Yourself At This time? No  Have you Recently Had Thoughts About [place]? No  Are You Planning to Harm Someone at This Time? No  Explanation: No data recorded  Have You Used Any Alcohol or Drugs in the Past 24 Hours? Yes  How Long Ago Did You Use Drugs or Alcohol? No data  recorded What Did You Use and How Much? Alcohol - 2 (40s)   Do You Currently Have a Therapist/Psychiatrist? No data recorded Name of Therapist/Psychiatrist: No data recorded  Have You Been Recently Discharged From Any Office Practice or Programs? No data recorded Explanation of Discharge From Practice/Program: No data recorded    CCA Screening Triage Referral Assessment Type of Contact: No data recorded Telemedicine Service Delivery:   Is this Initial or Reassessment? No data recorded Date Telepsych consult ordered in CHL:  No data recorded Time Telepsych consult ordered in CHL:  No data recorded Location of Assessment: No data recorded Provider Location: No data recorded  Collateral Involvement: No data recorded  Does Patient Have a [place]? No data recorded Name and Contact of Legal Guardian: No data recorded If Minor and Not Living with Parent(s), Who has Custody? No data recorded Is CPS involved or ever been involved? No data recorded Is APS involved or ever been involved? No data recorded  Patient Determined To Be At Risk for Harm To Self or Others Based on Review of Patient Reported Information or Presenting Complaint? No data recorded Method: No data recorded Availability of Means: No data recorded Intent: No data recorded Notification Required: No data recorded Additional Information for Danger to Others Potential: No data recorded Additional Comments for Danger to Others Potential: No data recorded Are There Guns or Other Weapons in Your Home? No data recorded Types of Guns/Weapons: No data recorded Are These Weapons Safely Secured?                            No data recorded Who Could Verify You Are Able To Have These Secured: No data recorded Do You Have any Outstanding Charges, Pending Court Dates, Parole/Probation? No data recorded Contacted To Inform of Risk of Harm To Self or Others: No data recorded   Does Patient Present under  Involuntary Commitment? No data recorded IVC Papers Initial File Date: No data recorded  [place] of Residence: No data recorded  Patient Currently Receiving the Following Services: No data recorded  Determination of Need: Urgent (48 hours)   Options For Referral: Inpatient Hospitalization; Medication Management     CCA Biopsychosocial Patient Reported Schizophrenia/Schizoaffective Diagnosis in Past: No data recorded  Strengths: No data recorded  Mental Health Symptoms Depression:   Difficulty Concentrating; Sleep (too much or little)   Duration of Depressive symptoms:    Mania:  No data recorded  Anxiety:    Worrying   Psychosis:  No data recorded  Duration of Psychotic symptoms:    Trauma:  No data recorded  Obsessions:  No data recorded  Compulsions:  No data recorded  Inattention:   Loses things; Forgetful   Hyperactivity/Impulsivity:   None   Oppositional/Defiant Behaviors:   Argumentative; Temper; Spiteful   Emotional Irregularity:  No data recorded  Other Mood/Personality Symptoms:  No data recorded   Mental Status Exam Appearance and self-care  Stature:   Average   Weight:  No data recorded  Clothing:   Disheveled   Grooming:   Neglected   Cosmetic use:   None   Posture/gait:  No data recorded  Motor activity:  No data recorded  Sensorium  Attention:  No data recorded  Concentration:  No data recorded  Orientation:   X5   Recall/memory:   Normal   Affect and Mood  Affect:  No data recorded  Mood:  No data recorded  Relating  Eye contact:   Normal   Facial expression:   Responsive   Attitude toward examiner:   Cooperative   Thought and Language  Speech flow: No data recorded  Thought content:  No data recorded  Preoccupation:   None   Hallucinations:   None   Organization:  No data recorded  [employer] of Knowledge:   Fair   Intelligence:  No data recorded  Abstraction:  No data recorded   Judgement:   Poor   Reality Testing:  No data recorded  Insight:   Fair   Decision Making:   Impulsive   Social Functioning  Social Maturity:   Impulsive   Social Judgement:   "Street Smart"   Stress  Stressors:   Grief/losses   Coping Ability:   Advice worker Deficits:   Self-control   Supports:   Friends/Service system; Family     Religion:    Leisure/Recreation:    Exercise/Diet:     CCA Employment/Education Employment/Work Situation:    Education:     CCA Family/Childhood History Family and Relationship History:    Childhood History:     Child/Adolescent Assessment:     CCA Substance Use Alcohol/Drug Use:        ASAM's:  Six Dimensions of Multidimensional Assessment  Dimension 1:  Acute Intoxication and/or Withdrawal Potential:      Dimension 2:  Biomedical Conditions and Complications:      Dimension 3:  Emotional, Behavioral, or Cognitive Conditions and Complications:     Dimension 4:  Readiness to Change:     Dimension 5:  Relapse, Continued use, or Continued Problem Potential:     Dimension 6:  Recovery/Living Environment:     ASAM Severity Score:    ASAM Recommended Level of Treatment:     Substance use Disorder (SUD)    Recommendations for Services/Supports/Treatments:    Discharge Disposition:    DSM5 Diagnoses: Patient Active Problem List   Diagnosis Date Noted   Chronic hepatitis C without hepatic coma ([place]) 05/07/2021   Cirrhosis of liver ([place]) 04/26/2021   Hx of traumatic brain injury 04/26/2021   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 ([place]) 04/26/2021   Tobacco dependence 04/26/2021   Chronic venous insufficiency of left lower extremity 02/16/2021   Alcohol use disorder, moderate, in early remission ([place])      Referrals to Alternative Service(s): Referred to Alternative Service(s):   Place:   Date:   Time:    Referred to Alternative Service(s):   Place:   Date:   Time:    Referred to  Alternative Service(s):   Place:   Date:   Time:    Referred to Alternative Service(s):   Place:   Date:   Time:     Vertell Novak, [facility] Comprehensive Clinical Assessment (CCA) Screening, Triage and Referral Note  06/24/2021 Benjamin Peterson [id number]  Chief Complaint: No chief complaint on file.  Visit Diagnosis:   Patient Reported Information How did you hear about [place]? Legal System  What Is the Reason for Your Visit/Call Today? IVC  How Long Has This Been Causing You Problems? <Week  What Do You Feel Would Help You the  Most Today? Treatment for Depression or other mood problem   Have You Recently Had Any Thoughts About Hurting Yourself? No  Are You Planning to Commit Suicide/Harm Yourself At This time? No   Have you Recently Had Thoughts About [place]? No  Are You Planning to Harm Someone at This Time? No  Explanation: No data recorded  Have You Used Any Alcohol or Drugs in the Past 24 Hours? Yes  How Long Ago Did You Use Drugs or Alcohol? No data recorded What Did You Use and How Much? Alcohol - 2 (40s)   Do You Currently Have a Therapist/Psychiatrist? No data recorded Name of Therapist/Psychiatrist: No data recorded  Have You Been Recently Discharged From Any Office Practice or Programs? No data recorded Explanation of Discharge From Practice/Program: No data recorded   CCA Screening Triage Referral Assessment Type of Contact: No data recorded Telemedicine Service Delivery:   Is this Initial or Reassessment? No data recorded Date Telepsych consult ordered in CHL:  No data recorded Time Telepsych consult ordered in CHL:  No data recorded Location of Assessment: No data recorded Provider Location: No data recorded  Collateral Involvement: No data recorded  Does Patient Have a [place]? No data recorded Name and Contact of Legal Guardian: No data recorded If Minor and Not Living with Parent(s), Who has Custody? No data  recorded Is CPS involved or ever been involved? No data recorded Is APS involved or ever been involved? No data recorded  Patient Determined To Be At Risk for Harm To Self or Others Based on Review of Patient Reported Information or Presenting Complaint? No data recorded Method: No data recorded Availability of Means: No data recorded Intent: No data recorded Notification Required: No data recorded Additional Information for Danger to Others Potential: No data recorded Additional Comments for Danger to Others Potential: No data recorded Are There Guns or Other Weapons in Your Home? No data recorded Types of Guns/Weapons: No data recorded Are These Weapons Safely Secured?                            No data recorded Who Could Verify You Are Able To Have These Secured: No data recorded Do You Have any Outstanding Charges, Pending Court Dates, Parole/Probation? No data recorded Contacted To Inform of Risk of Harm To Self or Others: No data recorded  Does Patient Present under Involuntary Commitment? No data recorded IVC Papers Initial File Date: No data recorded  [place] of Residence: No data recorded  Patient Currently Receiving the Following Services: No data recorded  Determination of Need: Urgent (48 hours)   Options For Referral: Inpatient Hospitalization; Medication Management   Discharge Disposition:     Vertell Novak, [place], [place], [facility], [facility] Triage Specialist [phone]

## 2021-06-24 NOTE — ED Notes (Signed)
Thew pt has been asleep since he arrived back in hw 15

## 2021-06-24 NOTE — ED Notes (Signed)
Up to yhe br then back to sleep

## 2021-06-25 ENCOUNTER — Other Ambulatory Visit: Payer: Self-pay | Admitting: Family

## 2021-06-25 DIAGNOSIS — K7460 Unspecified cirrhosis of liver: Secondary | ICD-10-CM

## 2021-07-08 ENCOUNTER — Other Ambulatory Visit: Payer: Self-pay

## 2021-07-08 ENCOUNTER — Emergency Department (HOSPITAL_COMMUNITY)
Admission: EM | Admit: 2021-07-08 | Discharge: 2021-07-08 | Disposition: A | Discharge status: Home / Self Care | Payer: Medicaid - Out of State | Attending: Emergency Medicine | Admitting: Emergency Medicine

## 2021-07-08 ENCOUNTER — Encounter (HOSPITAL_COMMUNITY): Payer: Self-pay | Admitting: Emergency Medicine

## 2021-07-08 DIAGNOSIS — Z8505 Personal history of malignant neoplasm of liver: Secondary | ICD-10-CM | POA: Diagnosis not present

## 2021-07-08 DIAGNOSIS — F17210 Nicotine dependence, cigarettes, uncomplicated: Secondary | ICD-10-CM | POA: Diagnosis not present

## 2021-07-08 DIAGNOSIS — U071 COVID-19: Secondary | ICD-10-CM

## 2021-07-08 DIAGNOSIS — Z8616 Personal history of COVID-19: Secondary | ICD-10-CM | POA: Diagnosis not present

## 2021-07-08 DIAGNOSIS — J40 Bronchitis, not specified as acute or chronic: Secondary | ICD-10-CM

## 2021-07-08 DIAGNOSIS — R059 Cough, unspecified: Secondary | ICD-10-CM | POA: Diagnosis present

## 2021-07-08 NOTE — ED Notes (Signed)
Pt ambulating in room talking on phone.

## 2021-07-08 NOTE — ED Triage Notes (Addendum)
Pt states he is here due to having COVID. Pt tested positive at [facility] on 06/24/21. Pt states he has a headache, cough and has been sleeping more than usual. Pt states he walked up here to the ED for eval.

## 2021-07-08 NOTE — ED Notes (Signed)
Pt ambulatory to lobby. Pt rec'd d.c papers

## 2021-07-08 NOTE — ED Triage Notes (Signed)
Pt's girlfriend called to report pt has been very confused, weak, sleeping more often, and vomiting blood. Pt was recently diagnosed with Covid a couple of weeks ago. His girlfriend convinced him to come to the ED today to be evaluated but she is afraid he won't give all the information so she called to update [place] on his situation. 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

## 2021-07-08 NOTE — ED Provider Notes (Signed)
[facility] EMERGENCY DEPARTMENT Provider Note  CSN: [id number] Arrival date & time: 07/08/21 1814    History Chief Complaint  Patient presents with   Covid Positive    Benjamin Peterson is a 65 y.o. male with history as below was diagnosed with Covid about 2 weeks ago at [facility]. He has been doing well but reports some persistent mild cough. He came to the ED today to 'see if he still has it'. He has not had a fever or SOB. He continues to smoke and drink alcohol regularly.    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 No pertinent surgical history.  History reviewed. No pertinent family history.  Social History   Tobacco Use   Smoking status: Heavy Smoker    Packs/day: 1.00    Types: Cigarettes   Smokeless tobacco: Never  Vaping Use   Vaping Use: Never used  Substance Use Topics   Alcohol use: Yes    Comment: 2.5 gallons of liquor   Drug use: No     Home Medications Prior to Admission medications   Medication Sig Start Date End Date Taking? Authorizing Provider  acetaminophen (TYLENOL) 500 MG tablet Take 500 mg by mouth daily as needed for mild pain or headache. 04/20/20   [provider]  Elastic Bandages & Supports (JOBST KNEE HIGH COMPRESSION SM) MISC For left leg swelling 04/27/21   Maness, Arnette Norris, MD  levETIRAcetam (KEPPRA) 500 MG tablet Take 1 tablet (500 mg total) by mouth 2 (two) times daily. 04/26/21   Maness, Philip, MD  Sofosbuvir-Velpatasvir (EPCLUSA) 400-100 MG TABS Take 1 tablet by mouth daily.  05/15/21   [place], FNP     Allergies    Patient has no known allergies.   Review of Systems   Review of Systems A comprehensive review of systems was completed and negative except as noted in HPI.    Physical Exam BP (!) 164/99   Pulse 65   Temp 99 F (37.2 C) (Oral)   Resp 19   Ht '5\' 7"'$  (1.702 m)   Wt 81.6 kg   SpO2 96%   BMI 28.18 kg/m   Physical Exam Vitals and nursing note reviewed.  Constitutional:      Appearance: Normal appearance.  HENT:     Head: Normocephalic and atraumatic.     Nose: Nose normal.     Mouth/Throat:     Mouth: Mucous membranes are moist.  Eyes:     Extraocular Movements: Extraocular movements intact.     Conjunctiva/sclera: Conjunctivae normal.  Cardiovascular:     Rate and Rhythm: Normal rate.  Pulmonary:     Effort: Pulmonary effort is normal.     Breath sounds: Normal breath sounds.  Abdominal:     General: Abdomen is flat.     Palpations: Abdomen is soft.     Tenderness: There is no abdominal tenderness.  Musculoskeletal:        General: No swelling. Normal range of motion.  Cervical back: Neck supple.  Skin:    General: Skin is warm and dry.  Neurological:     General: No focal deficit present.     Mental Status: He is alert.  Psychiatric:        Mood and Affect: Mood normal.     ED Results / Procedures / Treatments   Labs (all labs ordered are listed, but only abnormal results are displayed) Labs Reviewed - No data to display  EKG None  Radiology No results found.  Procedures Procedures  Medications Ordered in the ED Medications - No data to display   MDM Rules/Calculators/A&P MDM Patient well appearing, no distress. No respiratory distress or hypoxia. Patient reassured he does not appear to have an acute worsening of Covid. No indication for additional ED workup. Advised to stop smoking or drinking alcohol. PCP follow up.   ED Course  I have reviewed the triage vital signs and the nursing  notes.  Pertinent labs & imaging results that were available during my care of the patient were reviewed by me and considered in my medical decision making (see chart for details).     Final Clinical Impression(s) / ED Diagnoses Final diagnoses:  Bronchitis due to COVID-19 virus    Rx / DC Orders ED Discharge Orders     None        Truddie Hidden, MD 07/08/21 2241

## 2021-07-09 ENCOUNTER — Other Ambulatory Visit (HOSPITAL_COMMUNITY): Payer: Self-pay

## 2021-07-12 ENCOUNTER — Other Ambulatory Visit (HOSPITAL_COMMUNITY): Payer: Self-pay

## 2021-07-18 ENCOUNTER — Other Ambulatory Visit (HOSPITAL_COMMUNITY): Payer: Self-pay

## 2021-07-18 ENCOUNTER — Other Ambulatory Visit: Payer: Self-pay

## 2021-07-18 ENCOUNTER — Ambulatory Visit (INDEPENDENT_AMBULATORY_CARE_PROVIDER_SITE_OTHER): Payer: Medicaid - Out of State | Admitting: Gastroenterology

## 2021-07-18 ENCOUNTER — Encounter (INDEPENDENT_AMBULATORY_CARE_PROVIDER_SITE_OTHER): Payer: Self-pay | Admitting: Gastroenterology

## 2021-07-18 VITALS — BP 164/91 | HR 93 | Temp 98.1°F | Ht 69.0 in | Wt 190.5 lb

## 2021-07-18 DIAGNOSIS — B182 Chronic viral hepatitis C: Secondary | ICD-10-CM | POA: Diagnosis not present

## 2021-07-18 DIAGNOSIS — Z860100 Personal history of colon polyps, unspecified: Secondary | ICD-10-CM | POA: Insufficient documentation

## 2021-07-18 DIAGNOSIS — K7030 Alcoholic cirrhosis of liver without ascites: Secondary | ICD-10-CM

## 2021-07-18 NOTE — Progress Notes (Signed)
Benjamin Peterson, M.D. Gastroenterology & Hepatology [facility] [address] [place], [place] [address] Primary Care Physician: Precious Gilding, DO [place] [address]  Referring MD: PCP  Chief Complaint: Hepatitis C, liver cirrhosis  History of Present Illness: Benjamin Peterson is a 65 y.o. male with past medical history of liver cirrhosis secondary to hepatitis C and alcoholism, seizures, schizophrenia, who presents for evaluation of liver cirrhosis and hepatitis C.  Patient reports that he feels tired all the time and feels fatigued frequently. Denies any other complaints at the moment. States he has lost 27 lb in th last 2 months. The patient denies having any nausea, vomiting, fever, chills, hematochezia, melena, hematemesis, abdominal distention, abdominal pain, diarrhea, jaundice, pruritus.  Based on clinical notes, he had a diagnosis of liver cancer in the past but the patient reports that he had imaging performed in the past and this was rule out.  I do not see any imaging supporting the diagnosis.  The patient had prior history of hepatitis C.  He was treated with interferon in the past but did not achieve SVR.  He was recently being followed by Ples Specter. Calone, FNP from infectious disease who started the authorization process for treatment with Epclusa.  Upon investigation she has had in the past, in June 2022 he had a positive hepatitis C antibody, hep C titer of 378,000, normal AFP of 2.2, negative hepatitis B surface antigen, hepatitis C genotype Ia.  He also had an F4 FibroTest consistent with liver cirrhosis.  He had negative HIV.  Last CMP on 06/23/2021 showed a total bilirubin of 1.7, alkaline phosphatase 76, AST 53, ALT 25, normal renal function and electrolytes. Last CBC PLT 49 , WBC 4.1, Hb 12.8.  He had positive testing for COVID.  Notably, he had a liver elastography that showed cirrhotic morphology but a low  kPa of 8.0.  No presence of masses were seen.  Patient reports that he was prescribed Epclusa by the infectious disease specialist and he believes he started taking the medication at the beginning of the month (does not know if July or August) but ran out of the medication a week ago. He was taking it daily but states "he ran short of the medication" and did not ask for refills.  He did not follow-up with infectious disease doctor after this happened.  States that he was tolerating the medication adequately.  The patient adamantly denies drinking any alcohol when I asked him several times.  Notably, he has a history of chronic alcoholism and 3 weeks ago he had elevated alcohol level up to 152.  There are multiple lab values that showed positive alcohol level in the last 2 years.  Last MELD on 06/23/2021 - 12  Last [id number] Last Colonoscopy:5 years ago, patient reports that he had it done at [facility] and had many polyps removed.  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 No pertinent surgical history.  Family History: Family History  Problem Relation Age of Onset   Healthy Mother    Heart disease Father      Social History: Social History   Tobacco Use  Smoking Status Heavy Smoker   Packs/day: 1.00   Types: Cigarettes  Smokeless Tobacco Never   Social History   Substance and Sexual Activity  Alcohol Use Not Currently   Comment: 2.5 gallons of liquor hx   Social History   Substance and Sexual Activity  Drug Use No    Allergies: No Known Allergies  Medications: Current Outpatient Medications  Medication Sig Dispense Refill   acetaminophen (TYLENOL) 500 MG tablet Take 500 mg by mouth daily as needed for mild pain or headache.     Elastic Bandages & Supports (JOBST KNEE HIGH COMPRESSION SM) MISC For left leg swelling     levETIRAcetam (KEPPRA) 500 MG tablet Take 1 tablet (500 mg total) by mouth 2 (two) times daily. 60 tablet 3   Sofosbuvir-Velpatasvir (EPCLUSA) 400-100 MG TABS Take 1 tablet by mouth daily. 28 tablet 5   No current facility-administered medications for this visit.    Review of Systems: GENERAL: negative for malaise, night sweats HEENT: No changes in hearing or vision, no nose bleeds or other nasal problems. NECK: Negative for lumps, goiter, pain and significant neck swelling RESPIRATORY: Negative for cough, wheezing CARDIOVASCULAR: Negative for chest pain, leg swelling, palpitations, orthopnea GI: SEE HPI MUSCULOSKELETAL: Negative for joint pain or swelling, back pain, and muscle pain. SKIN: Negative for lesions, rash PSYCH: Negative for sleep disturbance, mood disorder and recent psychosocial stressors. HEMATOLOGY Negative for prolonged bleeding, bruising easily, and swollen nodes. ENDOCRINE: Negative for cold or heat intolerance, polyuria, polydipsia and goiter. NEURO: negative for tremor, gait imbalance, syncope and seizures. The remainder of the review of systems is noncontributory.   Physical Exam: BP (!) 164/91 (BP Location: Left Arm, Patient Position: Sitting, Cuff Size: Small)   Pulse 93   Temp 98.1 F (36.7 C) (Oral)   Ht '5\' 9"'$  (1.753 m)    Wt 190 lb 8 oz (86.4 kg)   BMI 28.13 kg/m  GENERAL: The patient is AO x3, in no acute distress. HEENT: Head is normocephalic and atraumatic. EOMI are intact. Mouth is well hydrated and without lesions. NECK: Supple. No masses LUNGS: Clear to auscultation. No presence of rhonchi/wheezing/rales. Adequate chest expansion HEART: RRR, normal s1 and s2. ABDOMEN: Soft, nontender, no guarding, no peritoneal signs, and nondistended. BS +. No masses. EXTREMITIES: Without any cyanosis, clubbing, rash, lesions or edema. NEUROLOGIC: AOx3, no focal motor deficit. SKIN: no jaundice, no rashes   Imaging/Labs: as above  I personally reviewed and interpreted the available labs, imaging and endoscopic files.  Impression and Plan: Maciah Rampone is a 65 y.o. male with past medical history of liver cirrhosis secondary to hepatitis C and alcoholism, seizures, schizophrenia, who presents for evaluation of liver cirrhosis and hepatitis C. the patient has presented chronic liver cirrhosis due to his continued alcohol intake and hepatitis C.  In terms of the alcohol intake, I advised the patient he should avoid taking any kind of alcohol.  I inquired multiple times if he was drinking alcohol but he adamantly  denied even though there is positive testing for recent alcohol intake.  I advised the patient that he should avoid taking more alcohol as this will be detrimental for his liver disease.  Unfortunately, in terms of his hepatitis C, he is a treatment experience patient has not been compliant with his most recent treatment course with Epclusa.  He should follow with his infectious disease specialist to evaluate how to best treat this condition, but unfortunately I consider that he has a high risk of treatment failure due to interruption in his DAA.  I consider he is a poorly compliant patient, which makes him a poor candidate for salvage therapy with other DAA.  In terms of his cirrhosis, any manifestations of portal  hypertension as his platelet count is low which could significant splenomegaly (this could also be related to continued alcohol intake.  Due to this, we will proceed with an EGD to screen for esophageal varices.  His most recent MELD score is 15 but overall he is a poor candidate for liver transplant.  He had recent lab testing so no further work-up is warranted for now.  Finally, we will schedule him for repeat colonoscopy he had polyps in the past.  -  Patient was counseled regarding the importance of alcohol cessation. The patient was informed about the long term effects of continuous alcohol intake. - Follow up with Ples Specter. Calone, [place] regarding your hepatitis C treatment medication - Schedule EGD and colonoscopy -Reduce salt intake to <2 g per day - Can take Tylenol max of 2 g per day (650 mg q8h) for pain -Avoid NSAIDs for pain -Avoid eating raw oysters/shellfish -Ensure every night before going to sleep - RTC 6 months  NOTE: Patient had bedbugs during today's visit and the room had to be sanitized after his visit  All questions were answered.      Benjamin Peppers, MD Gastroenterology and Hepatology [facility]

## 2021-07-18 NOTE — Patient Instructions (Addendum)
Patient was counseled regarding the importance of alcohol cessation. The patient was informed about the long term effects of continuous alcohol intake. Please follow up with Ples Specter. Calone, [place] regarding your hepatitis C treatment medication Schedule EGD and colonoscopy Reduce salt intake to <2 g per day Can take Tylenol max of 2 g per day (650 mg q8h) for pain Avoid NSAIDs for pain Avoid eating raw oysters/shellfish Ensure every night before going to sleep

## 2021-07-23 ENCOUNTER — Other Ambulatory Visit (HOSPITAL_COMMUNITY): Payer: Self-pay

## 2021-07-24 ENCOUNTER — Telehealth (INDEPENDENT_AMBULATORY_CARE_PROVIDER_SITE_OTHER): Payer: Self-pay

## 2021-07-24 ENCOUNTER — Other Ambulatory Visit (INDEPENDENT_AMBULATORY_CARE_PROVIDER_SITE_OTHER): Payer: Self-pay

## 2021-07-24 ENCOUNTER — Encounter (INDEPENDENT_AMBULATORY_CARE_PROVIDER_SITE_OTHER): Payer: Self-pay

## 2021-07-24 DIAGNOSIS — B182 Chronic viral hepatitis C: Secondary | ICD-10-CM

## 2021-07-24 MED ORDER — PEG 3350-KCL-NA BICARB-NACL 420 G PO SOLR: 4000.0000 mL | ORAL | 0 refills | Status: AC

## 2021-07-24 NOTE — Telephone Encounter (Signed)
LeighAnn Rydan Gulyas, CMA  

## 2021-07-26 ENCOUNTER — Other Ambulatory Visit (HOSPITAL_COMMUNITY): Payer: Self-pay

## 2021-08-02 ENCOUNTER — Telehealth: Payer: Self-pay

## 2021-08-02 NOTE — Telephone Encounter (Signed)
[facility] Patient Advocate Encounter  [facility] and I have been unsuccsessful in reaching patient to be able to refill medication.    Patient only had 2 fills of epclusa.  We have tried multiple times without a response.  Ileene Patrick, [facility] Patient [facility] Phone: [phone] Fax:  [phone]

## 2021-08-03 ENCOUNTER — Telehealth: Payer: Self-pay | Admitting: Internal Medicine
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
## 2021-08-03 NOTE — Telephone Encounter (Signed)
Attempted to call [place] given recent phone call from [name] (see other phone note). Patient did not pick up.

## 2021-08-06 ENCOUNTER — Emergency Department (HOSPITAL_COMMUNITY): Payer: Medicaid Other

## 2021-08-06 ENCOUNTER — Other Ambulatory Visit: Payer: Self-pay

## 2021-08-06 ENCOUNTER — Emergency Department (HOSPITAL_COMMUNITY)
Admission: EM | Admit: 2021-08-06 | Discharge: 2021-08-07 | Disposition: A | Discharge status: Home / Self Care | Payer: Medicaid Other | Attending: Emergency Medicine | Admitting: Emergency Medicine

## 2021-08-06 DIAGNOSIS — Z79899 Other long term (current) drug therapy: Secondary | ICD-10-CM | POA: Insufficient documentation

## 2021-08-06 DIAGNOSIS — F17210 Nicotine dependence, cigarettes, uncomplicated: Secondary | ICD-10-CM | POA: Diagnosis not present

## 2021-08-06 DIAGNOSIS — Z8509 Personal history of malignant neoplasm of other digestive organs: Secondary | ICD-10-CM | POA: Diagnosis not present

## 2021-08-06 DIAGNOSIS — F1010 Alcohol abuse, uncomplicated: Secondary | ICD-10-CM | POA: Diagnosis not present

## 2021-08-06 DIAGNOSIS — R1031 Right lower quadrant pain: Secondary | ICD-10-CM | POA: Insufficient documentation

## 2021-08-06 DIAGNOSIS — R0781 Pleurodynia: Secondary | ICD-10-CM | POA: Insufficient documentation

## 2021-08-06 DIAGNOSIS — Z23 Encounter for immunization: Secondary | ICD-10-CM | POA: Insufficient documentation

## 2021-08-06 DIAGNOSIS — S0990XA Unspecified injury of head, initial encounter: Secondary | ICD-10-CM | POA: Diagnosis present

## 2021-08-06 DIAGNOSIS — S0181XA Laceration without foreign body of other part of head, initial encounter: Secondary | ICD-10-CM | POA: Diagnosis not present

## 2021-08-06 LAB — CBC WITH DIFFERENTIAL/PLATELET
Abs Immature Granulocytes: 0.01 10*3/uL (ref 0.00–0.07)
Basophils Absolute: 0 10*3/uL (ref 0.0–0.1)
Basophils Relative: 1 %
Eosinophils Absolute: 0.2 10*3/uL (ref 0.0–0.5)
Eosinophils Relative: 6 %
HCT: 40.3 % (ref 39.0–52.0)
Hemoglobin: 13.4 g/dL (ref 13.0–17.0)
Immature Granulocytes: 0 %
Lymphocytes Relative: 33 %
Lymphs Abs: 1.3 10*3/uL (ref 0.7–4.0)
MCH: 33.1 pg (ref 26.0–34.0)
MCHC: 33.3 g/dL (ref 30.0–36.0)
MCV: 99.5 fL (ref 80.0–100.0)
Monocytes Absolute: 0.5 10*3/uL (ref 0.1–1.0)
Monocytes Relative: 13 %
Neutro Abs: 2 10*3/uL (ref 1.7–7.7)
Neutrophils Relative %: 47 %
Platelets: 47 10*3/uL — ABNORMAL LOW (ref 150–400)
RBC: 4.05 MIL/uL — ABNORMAL LOW (ref 4.22–5.81)
RDW: 16 % — ABNORMAL HIGH (ref 11.5–15.5)
WBC: 4.1 10*3/uL (ref 4.0–10.5)
nRBC: 0 % (ref 0.0–0.2)

## 2021-08-06 LAB — HEPATIC FUNCTION PANEL
ALT: 22 U/L (ref 0–44)
AST: 53 U/L — ABNORMAL HIGH (ref 15–41)
Albumin: 3.1 g/dL — ABNORMAL LOW (ref 3.5–5.0)
Alkaline Phosphatase: 84 U/L (ref 38–126)
Bilirubin, Direct: 0.7 mg/dL — ABNORMAL HIGH (ref 0.0–0.2)
Indirect Bilirubin: 1.3 mg/dL — ABNORMAL HIGH (ref 0.3–0.9)
Total Bilirubin: 2 mg/dL — ABNORMAL HIGH (ref 0.3–1.2)
Total Protein: 7.7 g/dL (ref 6.5–8.1)

## 2021-08-06 LAB — ETHANOL: Alcohol, Ethyl (B): 245 mg/dL — ABNORMAL HIGH (ref <10)

## 2021-08-06 LAB — BASIC METABOLIC PANEL
Anion gap: 9 (ref 5–15)
BUN: 6 mg/dL — ABNORMAL LOW (ref 8–23)
CO2: 26 mmol/L (ref 22–32)
Calcium: 8.6 mg/dL — ABNORMAL LOW (ref 8.9–10.3)
Chloride: 106 mmol/L (ref 98–111)
Creatinine, Ser: 0.74 mg/dL (ref 0.61–1.24)
GFR, Estimated: >60 mL/min (ref >60)
Glucose, Bld: 114 mg/dL — ABNORMAL HIGH (ref 70–99)
Potassium: 3.8 mmol/L (ref 3.5–5.1)
Sodium: 141 mmol/L (ref 135–145)

## 2021-08-06 LAB — RAPID URINE DRUG SCREEN, HOSP PERFORMED
Amphetamines: NOT DETECTED
Barbiturates: NOT DETECTED
Benzodiazepines: NOT DETECTED
Cocaine: NOT DETECTED
Opiates: NOT DETECTED
Tetrahydrocannabinol: NOT DETECTED

## 2021-08-06 MED ORDER — TETANUS-DIPHTH-ACELL PERTUSSIS 5-2.5-18.5 LF-MCG/0.5 IM SUSY
0.5000 mL | PREFILLED_SYRINGE | Freq: Once | INTRAMUSCULAR | Status: AC
  Administered 2021-08-06: 0.5 mL via INTRAMUSCULAR
  Filled 2021-08-06: qty 0.5

## 2021-08-06 MED ORDER — IOHEXOL 350 MG/ML SOLN
100.0000 mL | Freq: Once | INTRAVENOUS | Status: AC | PRN
  Administered 2021-08-06: 100 mL via INTRAVENOUS

## 2021-08-06 MED ORDER — TETANUS-DIPHTH-ACELL PERTUSSIS 5-2.5-18.5 LF-MCG/0.5 IM SUSY: 0.5000 mL | PREFILLED_SYRINGE | Freq: Once | INTRAMUSCULAR | Status: DC
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
## 2021-08-06 NOTE — Discharge Instructions (Signed)
Your wound was repaired as best as possible with absorbable sutures so they do not need to be taken out.  Your tetanus was updated for the skin injury and it was otherwise cleaned out to help prevent infection.  The images did not show evidence of acute intracranial traumatic injury or other new injuries today.  Please rest and follow-up with your primary doctor.  If any symptoms change or worsen, please return to the nearest emergency department.
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
## 2021-08-06 NOTE — Progress Notes (Signed)
Shelter and substance abuse resources attached to patients AVS.

## 2021-08-06 NOTE — Progress Notes (Signed)
Due to this time of night patient would need to contact any friends or family he may have for assistance tonight once discharged.
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
## 2021-08-07 NOTE — Progress Notes (Signed)
CSW has contacted patients friend [name] multiple times with no answer. CSW received a call from Richardson Landry who stated he spoke with [name] who is patients ex wife. [name] had a son who she lost from suicide and cannot help patient.

## 2021-08-07 NOTE — ED Provider Notes (Signed)
10:32 AM Patient in no distress, awake, alert.  I discussed this case with our social work Medical laboratory scientific officer.  Patient appropriate for discharge.   Carmin Muskrat, MD 08/07/21 1032

## 2021-08-07 NOTE — ED Notes (Signed)
Pt verbalizes understanding of discharge instructions. Opportunity for questions and answers were provided. Pt discharged from the ED.   ?
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
## 2021-08-21 NOTE — Patient Instructions (Signed)
Benjamin Peterson  08/21/2021     @PREFPERIOPPHARMACY @   Your procedure is scheduled on  08/27/2021.   Report to [facility] at  [phone]  A.M.   Call this number if you have problems the morning of surgery:  [phone]   Remember:  Follow the diet and prep instructions given to you by the office.    Take these medicines the morning of surgery with A SIP OF WATER                       keppra, epclusa    Do not wear jewelry, make-up or nail polish.  Do not wear lotions, powders, or perfumes, or deodorant.  Do not shave 48 hours prior to surgery.  Men may shave face and neck.  Do not bring valuables to the hospital.  [facility] is not responsible for any belongings or valuables.  Contacts, dentures or bridgework may not be worn into surgery.  Leave your suitcase in the car.  After surgery it may be brought to your room.  For patients admitted to the hospital, discharge time will be determined by your treatment team.  Patients discharged the day of surgery will not be allowed to drive home and must have someone with them for 24 hours.    Special instructions:   DO NOT smoke tobacco or vape for 24 hours before your procedure.  Please read over the following fact sheets that you were given. Anesthesia Post-op Instructions and Care and Recovery After Surgery      Upper Endoscopy, Adult, Care After This sheet gives you information about how to care for yourself after your procedure. Your health care provider may also give you more specific instructions. If you have problems or questions, contact your health care provider. What can I expect after the procedure? After the procedure, it is common to have: A sore throat. Mild stomach pain or discomfort. Bloating. Nausea. Follow these instructions at home:  Follow instructions from your health care provider about what to eat or drink after your procedure. Return to your normal activities as told by your health care  provider. Ask your health care provider what activities are safe for you. Take over-the-counter and prescription medicines only as told by your health care provider. If you were given a sedative during the procedure, it can affect you for several hours. Do not drive or operate machinery until your health care provider says that it is safe. Keep all follow-up visits as told by your health care provider. This is important. Contact a health care provider if you have: A sore throat that lasts longer than one day. Trouble swallowing. Get help right away if: You vomit blood or your vomit looks like coffee grounds. You have: A fever. Bloody, black, or tarry stools. A severe sore throat or you cannot swallow. Difficulty breathing. Severe pain in your chest or abdomen. Summary After the procedure, it is common to have a sore throat, mild stomach discomfort, bloating, and nausea. If you were given a sedative during the procedure, it can affect you for several hours. Do not drive or operate machinery until your health care provider says that it is safe. Follow instructions from your health care provider about what to eat or drink after your procedure. Return to your normal activities as told by your health care provider. This information is not intended to replace advice given to you by your health care provider.  Make sure you discuss any questions you have with your health care provider. Document Revised: 11/01/2019 Document Reviewed: 04/05/2018 Elsevier Patient Education  2022 [place]. Colonoscopy, Adult, Care After This sheet gives you information about how to care for yourself after your procedure. Your health care provider may also give you more specific instructions. If you have problems or questions, contact your health care provider. What can I expect after the procedure? After the procedure, it is common to have: A small amount of blood in your stool for 24 hours after the  procedure. Some gas. Mild cramping or bloating of your abdomen. Follow these instructions at home: Eating and drinking  Drink enough fluid to keep your urine pale yellow. Follow instructions from your health care provider about eating or drinking restrictions. Resume your normal diet as instructed by your health care provider. Avoid heavy or fried foods that are hard to digest. Activity Rest as told by your health care provider. Avoid sitting for a long time without moving. Get up to take short walks every 1-2 hours. This is important to improve blood flow and breathing. Ask for help if you feel weak or unsteady. Return to your normal activities as told by your health care provider. Ask your health care provider what activities are safe for you. Managing cramping and bloating  Try walking around when you have cramps or feel bloated. Apply heat to your abdomen as told by your health care provider. Use the heat source that your health care provider recommends, such as a moist heat pack or a heating pad. Place a towel between your skin and the heat source. Leave the heat on for 20-30 minutes. Remove the heat if your skin turns bright red. This is especially important if you are unable to feel pain, heat, or cold. You may have a greater risk of getting burned. General instructions If you were given a sedative during the procedure, it can affect you for several hours. Do not drive or operate machinery until your health care provider says that it is safe. For the first 24 hours after the procedure: Do not sign important documents. Do not drink alcohol. Do your regular daily activities at a slower pace than normal. Eat soft foods that are easy to digest. Take over-the-counter and prescription medicines only as told by your health care provider. Keep all follow-up visits as told by your health care provider. This is important. Contact a health care provider if: You have blood in your stool 2-3  days after the procedure. Get help right away if you have: More than a small spotting of blood in your stool. Large blood clots in your stool. Swelling of your abdomen. Nausea or vomiting. A fever. Increasing pain in your abdomen that is not relieved with medicine. Summary After the procedure, it is common to have a small amount of blood in your stool. You may also have mild cramping and bloating of your abdomen. If you were given a sedative during the procedure, it can affect you for several hours. Do not drive or operate machinery until your health care provider says that it is safe. Get help right away if you have a lot of blood in your stool, nausea or vomiting, a fever, or increased pain in your abdomen. This information is not intended to replace advice given to you by your health care provider. Make sure you discuss any questions you have with your health care provider. Document Revised: 10/28/2019 Document Reviewed: 05/30/2019 Elsevier Patient Education  [place] After This sheet gives you information about how to care for yourself after your procedure. Your health care provider may also give you more specific instructions. If you have problems or questions, contact your health care provider. What can I expect after the procedure? After the procedure, it is common to have: Tiredness. Forgetfulness about what happened after the procedure. Impaired judgment for important decisions. Nausea or vomiting. Some difficulty with balance. Follow these instructions at home: For the time period you were told by your health care provider:   Rest as needed. Do not participate in activities where you could fall or become injured. Do not drive or use machinery. Do not drink alcohol. Do not take sleeping pills or medicines that cause drowsiness. Do not make important decisions or sign legal documents. Do not take care of children on your own. Eating  and drinking Follow the diet that is recommended by your health care provider. Drink enough fluid to keep your urine pale yellow. If you vomit: Drink water, juice, or soup when you can drink without vomiting. Make sure you have little or no nausea before eating solid foods. General instructions Have a responsible adult stay with you for the time you are told. It is important to have someone help care for you until you are awake and alert. Take over-the-counter and prescription medicines only as told by your health care provider. If you have sleep apnea, surgery and certain medicines can increase your risk for breathing problems. Follow instructions from your health care provider about wearing your sleep device: Anytime you are sleeping, including during daytime naps. While taking prescription pain medicines, sleeping medicines, or medicines that make you drowsy. Avoid smoking. Keep all follow-up visits as told by your health care provider. This is important. Contact a health care provider if: You keep feeling nauseous or you keep vomiting. You feel light-headed. You are still sleepy or having trouble with balance after 24 hours. You develop a rash. You have a fever. You have redness or swelling around the IV site. Get help right away if: You have trouble breathing. You have new-onset confusion at home. Summary For several hours after your procedure, you may feel tired. You may also be forgetful and have poor judgment. Have a responsible adult stay with you for the time you are told. It is important to have someone help care for you until you are awake and alert. Rest as told. Do not drive or operate machinery. Do not drink alcohol or take sleeping pills. Get help right away if you have trouble breathing, or if you suddenly become confused. This information is not intended to replace advice given to you by your health care provider. Make sure you discuss any questions you have with your  health care provider. Document Revised: 07/19/2020 Document Reviewed: 10/06/2019 Elsevier Patient Education  2022 [employer].

## 2021-08-23 ENCOUNTER — Encounter (HOSPITAL_COMMUNITY)
Admission: RE | Admit: 2021-08-23 | Discharge: 2021-08-23 | Disposition: A | Discharge status: Home / Self Care | Payer: Medicaid Other | Source: Ambulatory Visit | Attending: Gastroenterology | Admitting: Gastroenterology

## 2021-08-23 ENCOUNTER — Encounter (HOSPITAL_COMMUNITY): Payer: Self-pay

## 2021-08-23 NOTE — Pre-Procedure Instructions (Signed)
Spoke with Serena Colonel at Dr Colman Cater office to let her know patient was no show for Preop today.

## 2021-08-27 ENCOUNTER — Encounter (HOSPITAL_COMMUNITY): Admission: RE | Payer: Self-pay | Source: Home / Self Care

## 2021-08-27 ENCOUNTER — Ambulatory Visit (HOSPITAL_COMMUNITY): Admission: RE | Admit: 2021-08-27 | Payer: Medicaid Other | Source: Home / Self Care | Admitting: Gastroenterology

## 2021-08-27 SURGERY — COLONOSCOPY WITH PROPOFOL
Anesthesia: Monitor Anesthesia Care

## 2022-01-16 ENCOUNTER — Ambulatory Visit (INDEPENDENT_AMBULATORY_CARE_PROVIDER_SITE_OTHER): Payer: Medicaid Other | Admitting: Gastroenterology

## 2022-03-10 ENCOUNTER — Ambulatory Visit (INDEPENDENT_AMBULATORY_CARE_PROVIDER_SITE_OTHER): Payer: Medicaid Other | Admitting: Gastroenterology

## 2022-03-10 ENCOUNTER — Encounter (INDEPENDENT_AMBULATORY_CARE_PROVIDER_SITE_OTHER): Payer: Self-pay | Admitting: Gastroenterology

## 2022-04-22 ENCOUNTER — Encounter: Payer: Self-pay | Admitting: *Deleted

## 2022-06-17 DEATH — deceased

## 2024-04-26 ENCOUNTER — Encounter: Payer: Self-pay | Admitting: *Deleted
